# Patient Record
Sex: Male | Born: 1950 | Race: White | Hispanic: No | Marital: Married | Smoking: Never smoker
Health system: Southern US, Community
[De-identification: ages and names within clinical notes are randomized; demographics above are authoritative.]

## PROBLEM LIST (undated history)

## (undated) DIAGNOSIS — Z789 Other specified health status: Secondary | ICD-10-CM

---

## 2021-06-17 ENCOUNTER — Emergency Department (HOSPITAL_COMMUNITY): Payer: Medicare Other

## 2021-06-17 ENCOUNTER — Other Ambulatory Visit: Payer: Self-pay

## 2021-06-17 ENCOUNTER — Inpatient Hospital Stay (HOSPITAL_COMMUNITY)
Admission: EM | Admit: 2021-06-17 | Discharge: 2021-06-29 | DRG: 477 | Disposition: A | Discharge status: Skilled Nursing Facility | Payer: Medicare Other | Attending: Internal Medicine | Admitting: Internal Medicine

## 2021-06-17 DIAGNOSIS — E8809 Other disorders of plasma-protein metabolism, not elsewhere classified: Secondary | ICD-10-CM | POA: Diagnosis present

## 2021-06-17 DIAGNOSIS — M8448XA Pathological fracture, other site, initial encounter for fracture: Secondary | ICD-10-CM

## 2021-06-17 DIAGNOSIS — E871 Hypo-osmolality and hyponatremia: Secondary | ICD-10-CM | POA: Diagnosis present

## 2021-06-17 DIAGNOSIS — R748 Abnormal levels of other serum enzymes: Secondary | ICD-10-CM

## 2021-06-17 DIAGNOSIS — K769 Liver disease, unspecified: Secondary | ICD-10-CM

## 2021-06-17 DIAGNOSIS — C799 Secondary malignant neoplasm of unspecified site: Secondary | ICD-10-CM | POA: Diagnosis present

## 2021-06-17 DIAGNOSIS — T3995XA Adverse effect of unspecified nonopioid analgesic, antipyretic and antirheumatic, initial encounter: Secondary | ICD-10-CM | POA: Diagnosis not present

## 2021-06-17 DIAGNOSIS — G929 Unspecified toxic encephalopathy: Secondary | ICD-10-CM

## 2021-06-17 DIAGNOSIS — J439 Emphysema, unspecified: Secondary | ICD-10-CM | POA: Diagnosis present

## 2021-06-17 DIAGNOSIS — R5381 Other malaise: Secondary | ICD-10-CM

## 2021-06-17 DIAGNOSIS — Z87891 Personal history of nicotine dependence: Secondary | ICD-10-CM

## 2021-06-17 DIAGNOSIS — D72829 Elevated white blood cell count, unspecified: Secondary | ICD-10-CM | POA: Diagnosis present

## 2021-06-17 DIAGNOSIS — G928 Other toxic encephalopathy: Secondary | ICD-10-CM | POA: Diagnosis not present

## 2021-06-17 DIAGNOSIS — M546 Pain in thoracic spine: Secondary | ICD-10-CM

## 2021-06-17 DIAGNOSIS — Z66 Do not resuscitate: Secondary | ICD-10-CM | POA: Diagnosis not present

## 2021-06-17 DIAGNOSIS — C182 Malignant neoplasm of ascending colon: Secondary | ICD-10-CM | POA: Diagnosis present

## 2021-06-17 DIAGNOSIS — D509 Iron deficiency anemia, unspecified: Secondary | ICD-10-CM | POA: Diagnosis present

## 2021-06-17 DIAGNOSIS — Z515 Encounter for palliative care: Secondary | ICD-10-CM

## 2021-06-17 DIAGNOSIS — C7951 Secondary malignant neoplasm of bone: Principal | ICD-10-CM | POA: Diagnosis present

## 2021-06-17 DIAGNOSIS — R52 Pain, unspecified: Secondary | ICD-10-CM

## 2021-06-17 DIAGNOSIS — K6389 Other specified diseases of intestine: Secondary | ICD-10-CM

## 2021-06-17 DIAGNOSIS — D649 Anemia, unspecified: Secondary | ICD-10-CM | POA: Diagnosis present

## 2021-06-17 DIAGNOSIS — K5900 Constipation, unspecified: Secondary | ICD-10-CM | POA: Diagnosis present

## 2021-06-17 DIAGNOSIS — C787 Secondary malignant neoplasm of liver and intrahepatic bile duct: Secondary | ICD-10-CM | POA: Diagnosis present

## 2021-06-17 DIAGNOSIS — Z880 Allergy status to penicillin: Secondary | ICD-10-CM

## 2021-06-17 DIAGNOSIS — M8450XA Pathological fracture in neoplastic disease, unspecified site, initial encounter for fracture: Secondary | ICD-10-CM

## 2021-06-17 DIAGNOSIS — C779 Secondary and unspecified malignant neoplasm of lymph node, unspecified: Secondary | ICD-10-CM | POA: Diagnosis present

## 2021-06-17 DIAGNOSIS — I959 Hypotension, unspecified: Secondary | ICD-10-CM | POA: Diagnosis not present

## 2021-06-17 DIAGNOSIS — I700 Atherosclerosis of aorta: Secondary | ICD-10-CM | POA: Diagnosis present

## 2021-06-17 DIAGNOSIS — M8458XA Pathological fracture in neoplastic disease, other specified site, initial encounter for fracture: Secondary | ICD-10-CM | POA: Diagnosis present

## 2021-06-17 DIAGNOSIS — G893 Neoplasm related pain (acute) (chronic): Secondary | ICD-10-CM | POA: Diagnosis present

## 2021-06-17 DIAGNOSIS — Z7189 Other specified counseling: Secondary | ICD-10-CM

## 2021-06-17 DIAGNOSIS — I2510 Atherosclerotic heart disease of native coronary artery without angina pectoris: Secondary | ICD-10-CM | POA: Diagnosis present

## 2021-06-17 HISTORY — DX: Other specified health status: Z78.9

## 2021-06-17 LAB — CBC
HCT: 36.4 % — ABNORMAL LOW (ref 39.0–52.0)
Hemoglobin: 11.4 g/dL — ABNORMAL LOW (ref 13.0–17.0)
MCH: 26.1 pg (ref 26.0–34.0)
MCHC: 31.3 g/dL (ref 30.0–36.0)
MCV: 83.3 fL (ref 80.0–100.0)
Platelets: 346 10*3/uL (ref 150–400)
RBC: 4.37 MIL/uL (ref 4.22–5.81)
RDW: 18.2 % — ABNORMAL HIGH (ref 11.5–15.5)
WBC: 17 10*3/uL — ABNORMAL HIGH (ref 4.0–10.5)
nRBC: 0 % (ref 0.0–0.2)

## 2021-06-17 LAB — BASIC METABOLIC PANEL
Anion gap: 11 (ref 5–15)
BUN: 24 mg/dL — ABNORMAL HIGH (ref 8–23)
CO2: 25 mmol/L (ref 22–32)
Calcium: 10.5 mg/dL — ABNORMAL HIGH (ref 8.9–10.3)
Chloride: 96 mmol/L — ABNORMAL LOW (ref 98–111)
Creatinine, Ser: 1.01 mg/dL (ref 0.61–1.24)
GFR, Estimated: >60 mL/min (ref >60)
Glucose, Bld: 110 mg/dL — ABNORMAL HIGH (ref 70–99)
Potassium: 4 mmol/L (ref 3.5–5.1)
Sodium: 132 mmol/L — ABNORMAL LOW (ref 135–145)

## 2021-06-17 IMAGING — MR MR THORACIC SPINE WO/W CM
6 of 10 series · 24 of 48 positions shown · IV contrast (gadavist)
Comparison: None Available.

CLINICAL DATA: Initial evaluation for acute back pain, trauma.

EXAM:
MRI THORACIC WITHOUT AND WITH CONTRAST
TECHNIQUE: Multiplanar and multiecho pulse sequences of the thoracic spine were
obtained without and with intravenous contrast.
CONTRAST:  8.5mL GADAVIST GADOBUTROL 1 MMOL/ML IV SOLN

[Series 14: T1 · sagittal · 6.0mm · 1.23mm/px · 1 of 8 slices shown (1 of 3)]
[im 1/8]
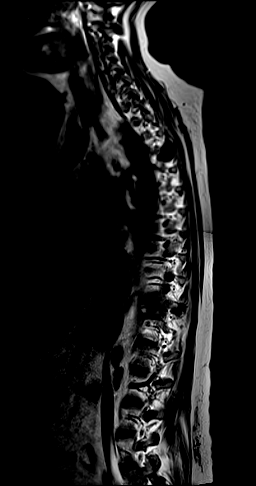

[Series 15: T2 · sagittal · 3.0mm · 0.76mm/px · 3 of 17 slices shown (1 of 3)]
[im 1/17]
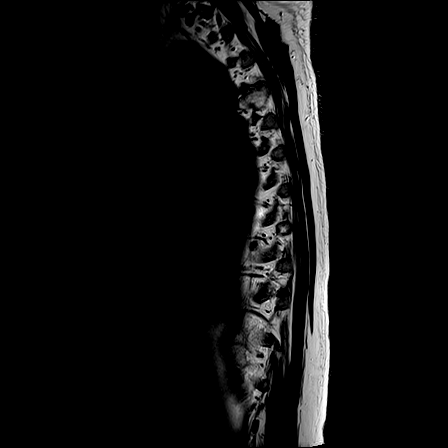
[im 9/17]
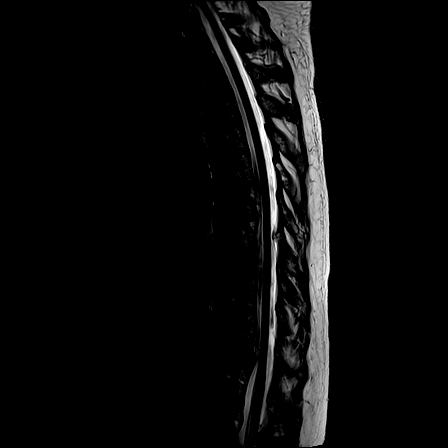
[im 17/17]
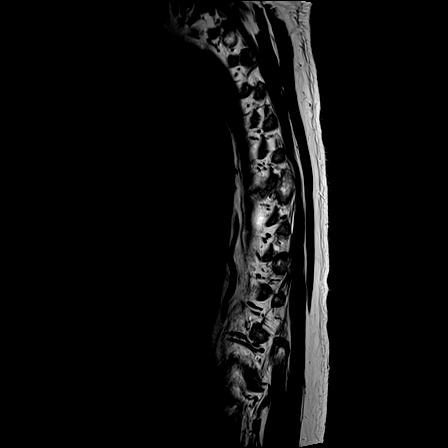

[Series 16: T1 · sagittal · 3.0mm · 0.76mm/px · 3 of 17 slices shown (2 of 3)]
[im 1/17]
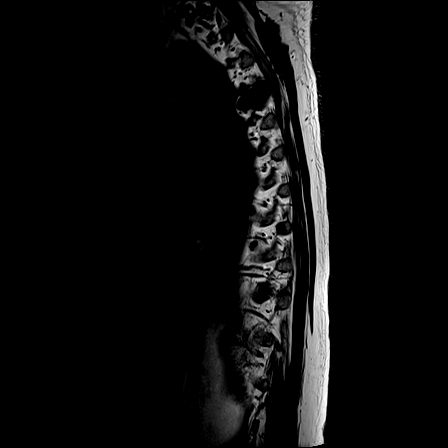
[im 9/17]
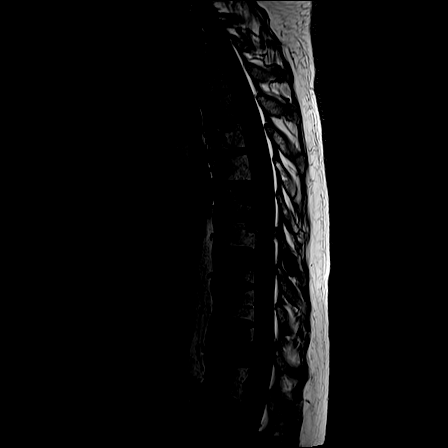
[im 17/17]
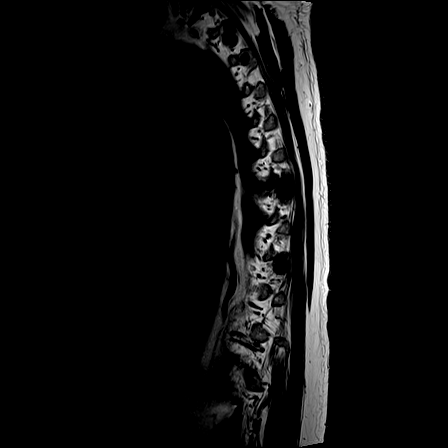

[Series 17: T2 · sagittal · 3.0mm · 0.76mm/px · 3 of 17 slices shown (2 of 3)]
[im 1/17]
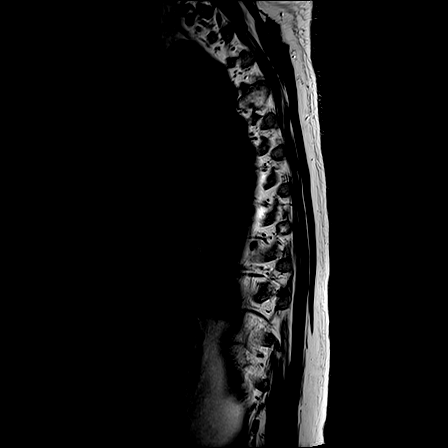
[im 9/17]
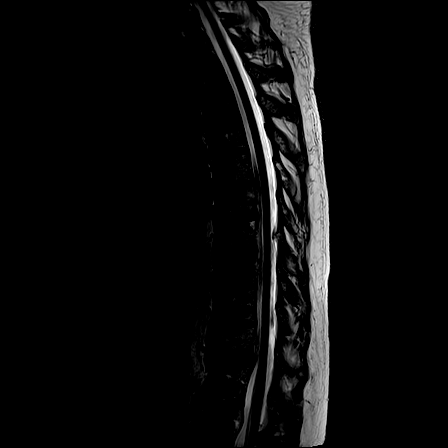
[im 17/17]
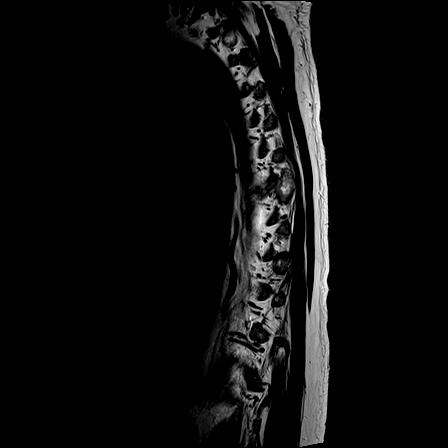

[Series 19: T2 · axial · 4.0mm · 0.59mm/px · z∈[-271,-13]mm · 8 of 40 slices shown (3 of 3)]
[im 1/40]
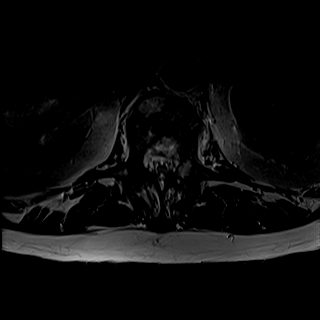
[im 6/40]
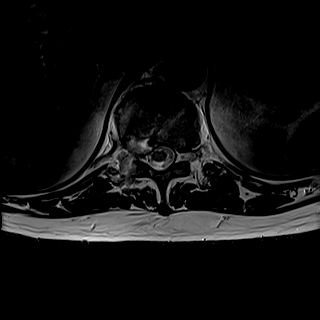
[im 12/40]
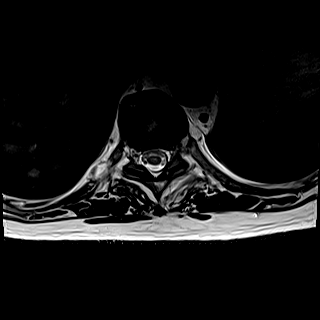
[im 17/40]
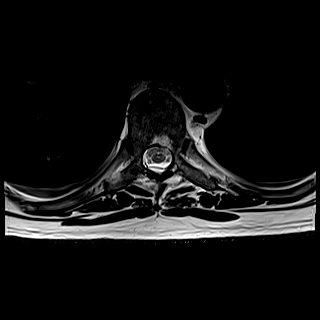
[im 23/40]
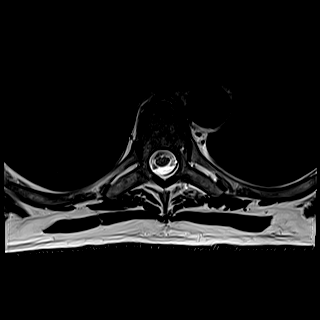
[im 28/40]
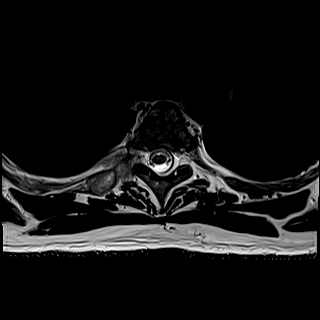
[im 34/40]
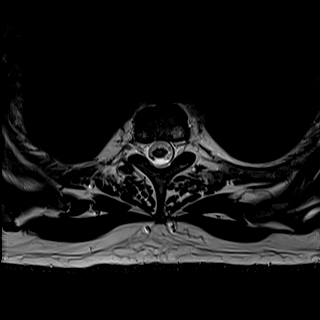
[im 40/40]
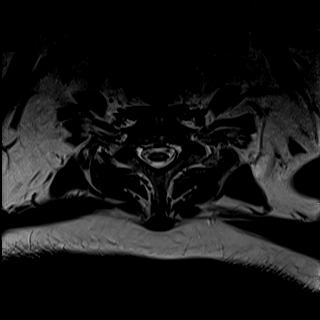

[Series 21: T1 · axial · non-contrast · 4.0mm · 0.31mm/px · z∈[-271,-79]mm · 6 of 40 slices shown (3 of 3)]
[im 1/40]
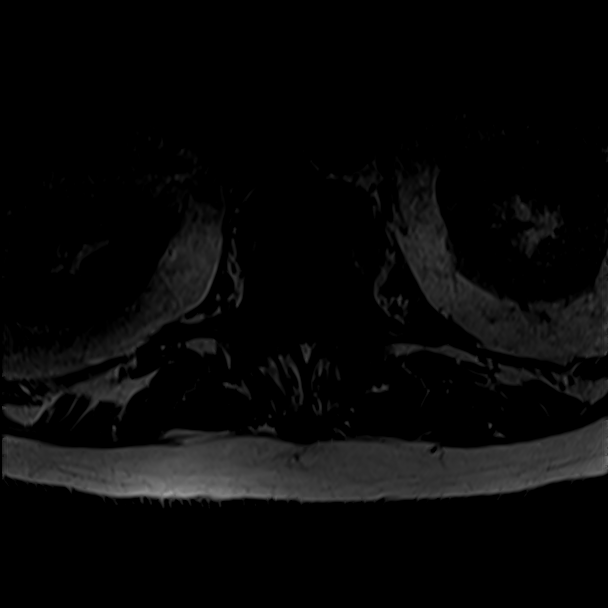
[im 6/40]
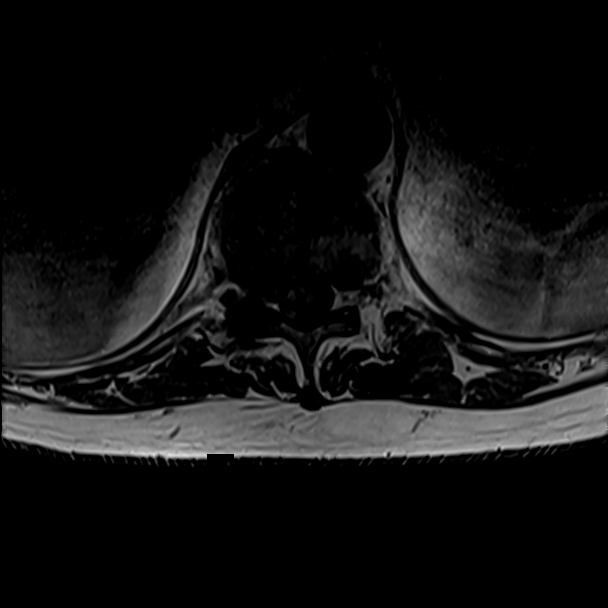
[im 12/40]
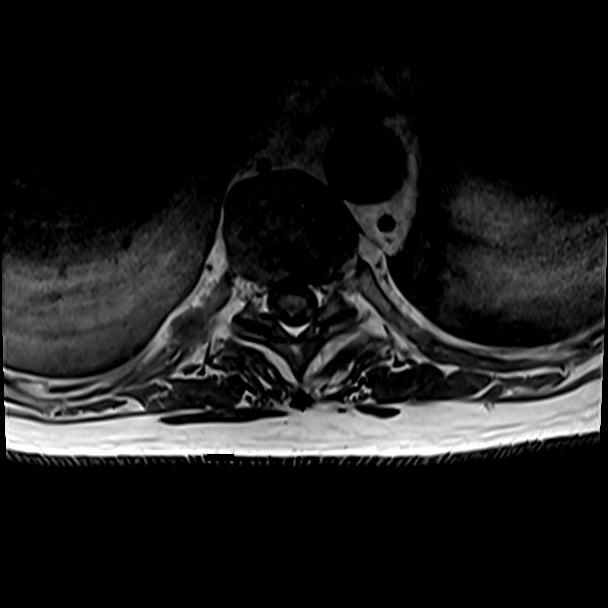
[im 17/40]
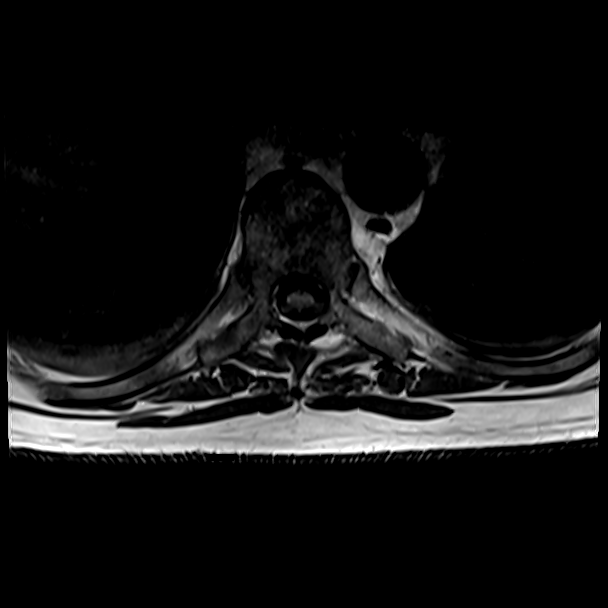
[im 23/40]
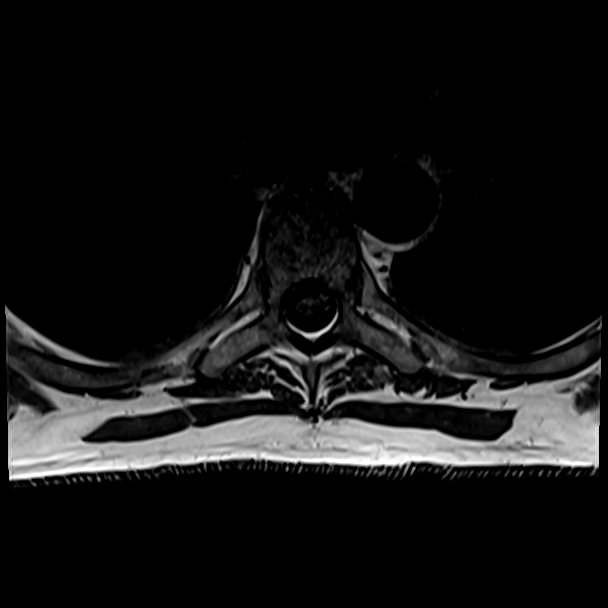
[im 28/40]
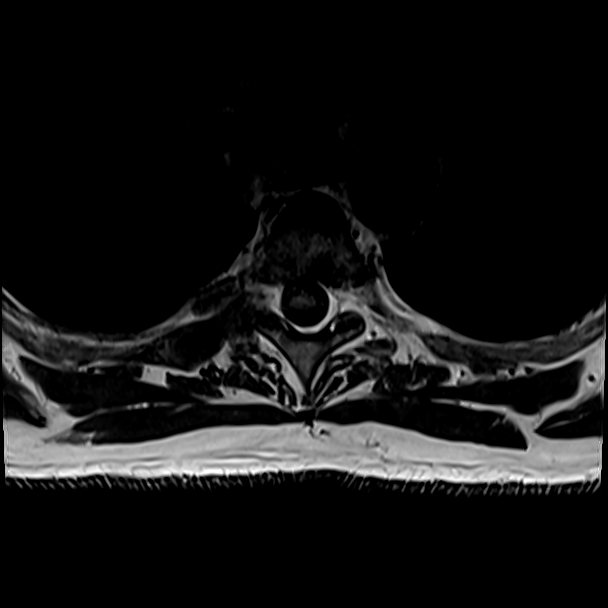

[24 of 48 positions shown; findings below may reference images not displayed]

FINDINGS: Alignment: Physiologic with preservation of the normal thoracic
kyphosis.

Vertebrae: There are innumerable T1 hypointense, stir hyperintense
enhancing lesions seen throughout the thoracic spine, consistent
with widespread osseous metastatic disease. There is involvement of
essentially all levels. Posterior elements are involved at numerous
levels as well. Associated pathologic fracture at T11 with up to 35%
height loss. Evidence for early epidural extension into the ventral
epidural space at T7, T9, and T11. Prominent extra osseous extension
with soft tissue component seen about the left rib and transverse
process of T7, measuring approximately 3.3 x 2.4 cm (series 20,
image 20). Multiple lesion seen involving the partially visualized
posterior ribs as well. No significant or high-grade spinal stenosis
at this time.

Cord: Early epidural extension at the levels of T7, T9, and T11. No
high-grade spinal stenosis or frank cord impingement. Cord itself is
normal in caliber in appearance. No abnormal intramedullary
enhancement.

Paraspinal and other soft tissues: Extraosseous extension with
associated soft tissue component about the left transverse process
of T7 as above. More mild extraosseous extension also noted on the
right at T4 and on the left at T9. Innumerable lesions seen
throughout the partially visualized liver, likely reflecting
metastatic disease.

Disc levels:

No significant underlying disc pathology. No high-grade spinal
stenosis. Mild foraminal narrowing on the right at T6-7 and T11-12
related to extraosseous extension of tumor. Foramina otherwise
remain patent at this time.
IMPRESSION: 1. Widespread osseous metastatic disease throughout the thoracic
spine as above.
2. Associated pathologic fracture at T11 with up to 35% height loss.
3. Evidence for early epidural extension at the levels of T7, T9,
and T11. No high-grade spinal stenosis or frank cord impingement at
this time.
4. Innumerable lesions throughout the partially visualized liver,
likely reflecting metastatic disease.

## 2021-06-17 IMAGING — MR MR LUMBAR SPINE WO/W CM
4 of 7 series · 23 of 48 positions shown · IV contrast (Contrast agent)
Comparison: None Available.

CLINICAL DATA: Initial evaluation for trauma, severe back pain.

EXAM:
MRI LUMBAR SPINE WITHOUT AND WITH CONTRAST
TECHNIQUE: Multiplanar and multiecho pulse sequences of the lumbar spine were
obtained without and with intravenous contrast.
CONTRAST:  8.5mL GADAVIST GADOBUTROL 1 MMOL/ML IV SOLN

[Series 26: T2 · sagittal · 4.0mm · 0.78mm/px · 5 of 17 slices shown (1 of 2)]
[im 1/17]
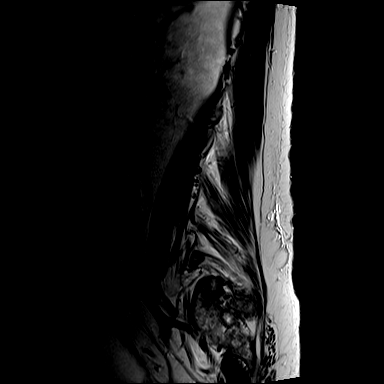
[im 5/17]
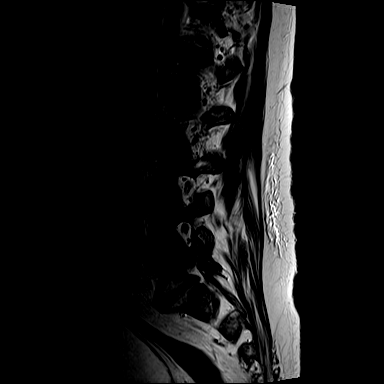
[im 9/17]
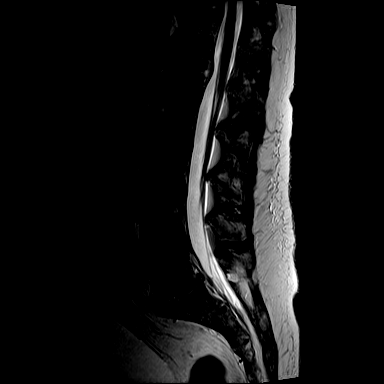
[im 13/17]
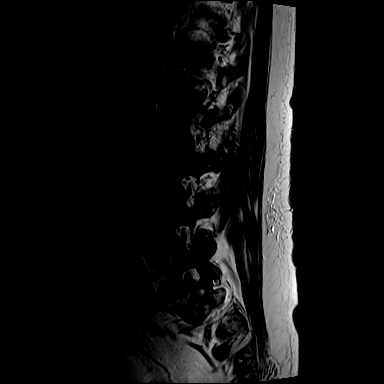
[im 17/17]
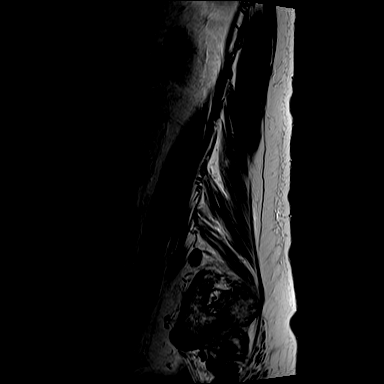

[Series 28: T1 · sagittal · 4.0mm · 0.94mm/px · 4 of 17 slices shown (1 of 2)]
[im 1/17]
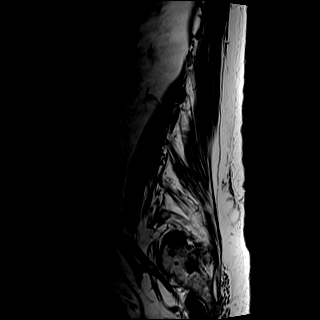
[im 6/17]
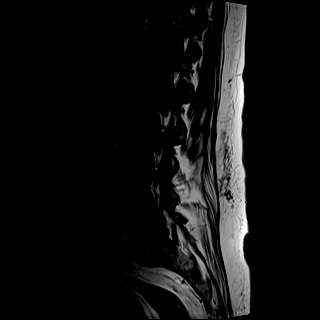
[im 11/17]
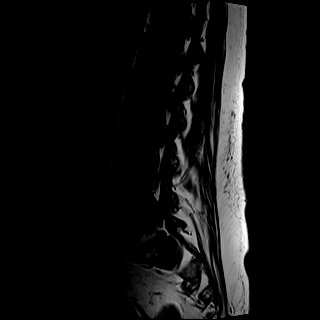
[im 17/17]
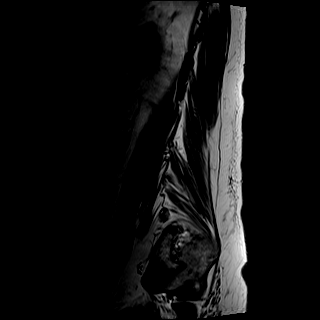

[Series 29: T2 · axial · 4.0mm · 0.57mm/px · z∈[-489,-278]mm · 8 of 39 slices shown (2 of 2)]
[im 1/39]
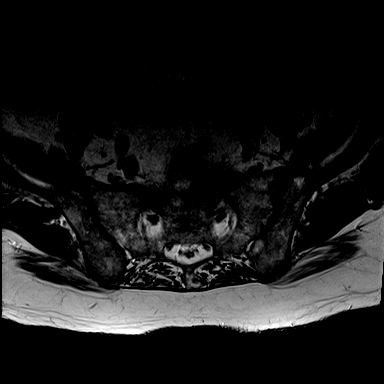
[im 5/39]
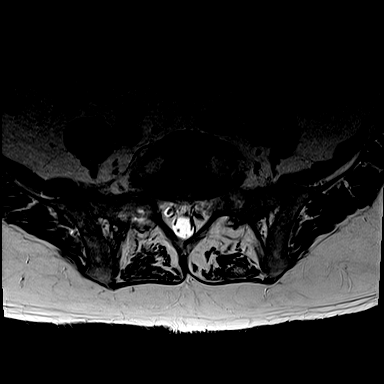
[im 13/39]
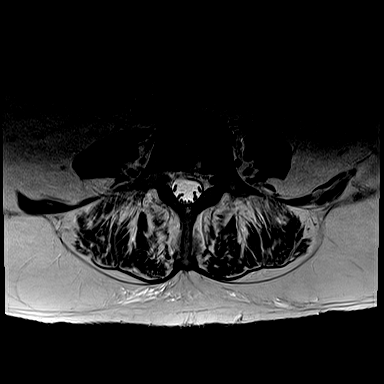
[im 17/39]
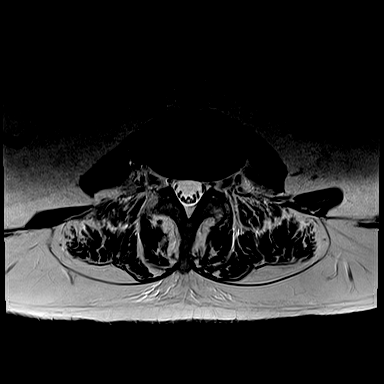
[im 22/39]
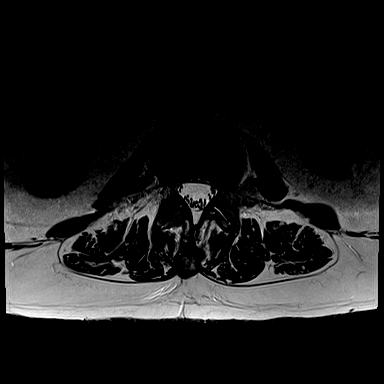
[im 26/39]
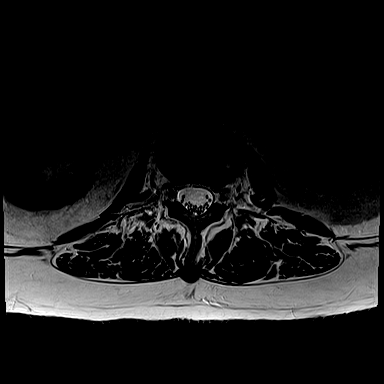
[im 34/39]
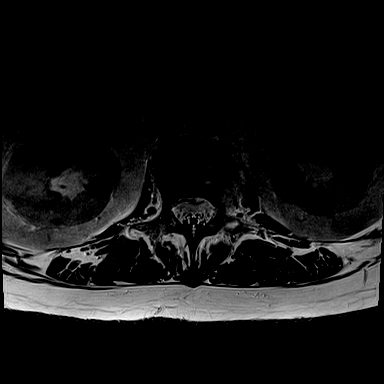
[im 39/39]
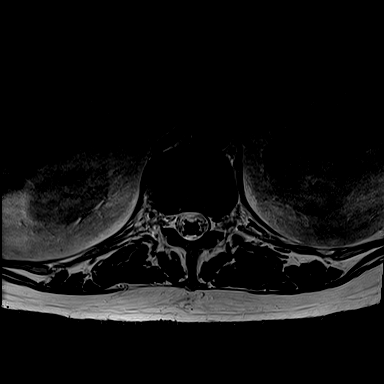

[Series 30: T1 · axial · 4.0mm · 0.34mm/px · z∈[-489,-303]mm · 6 of 39 slices shown (2 of 2)]
[im 1/39]
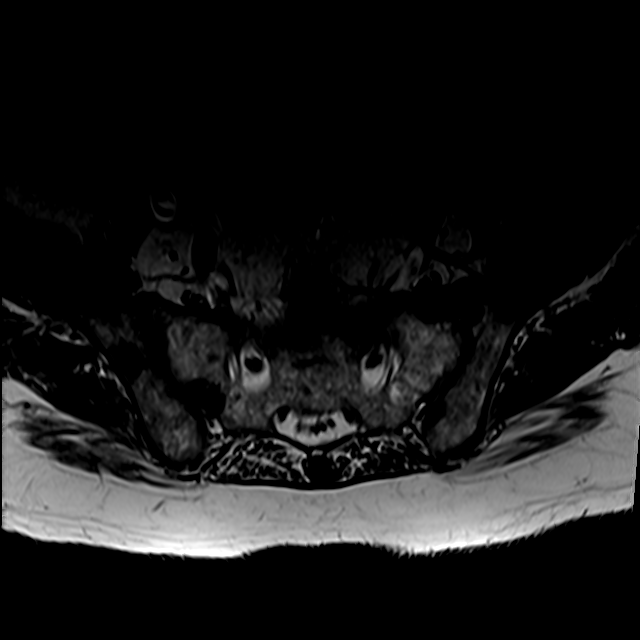
[im 5/39]
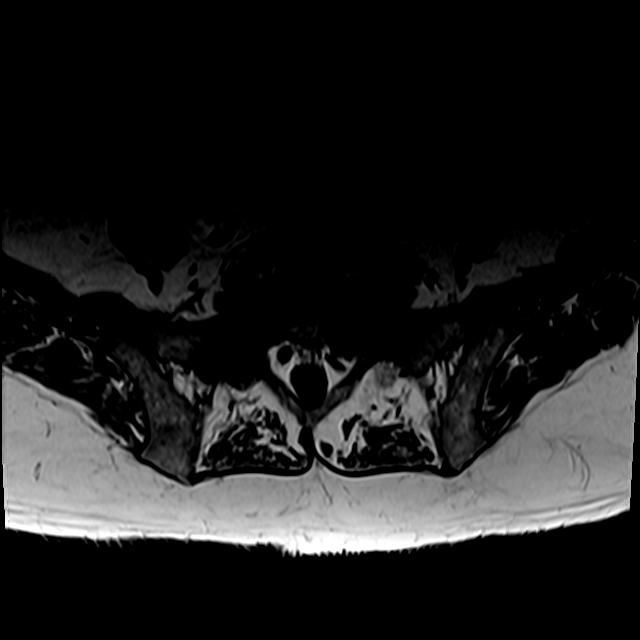
[im 13/39]
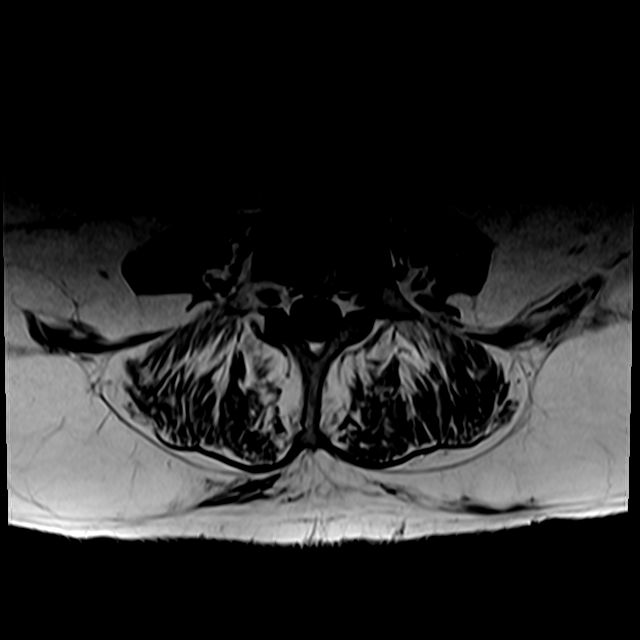
[im 17/39]
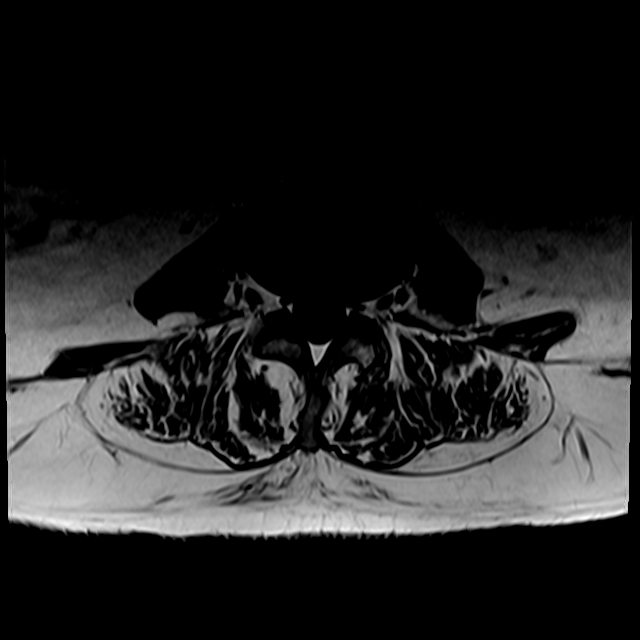
[im 22/39]
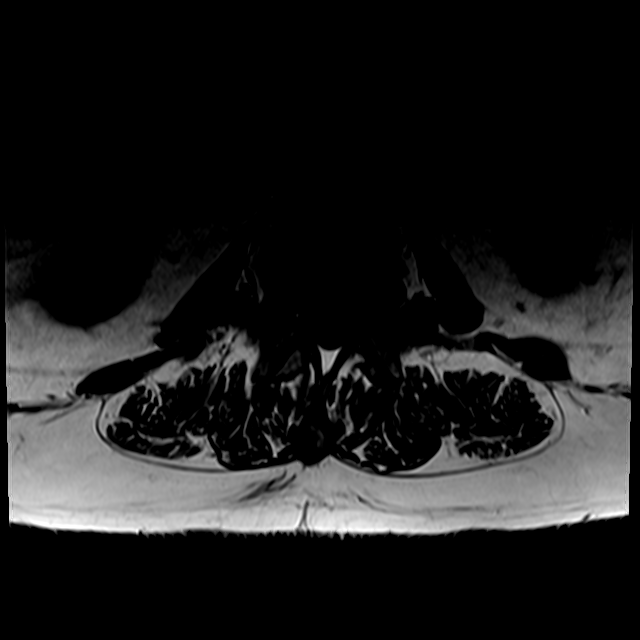
[im 34/39]
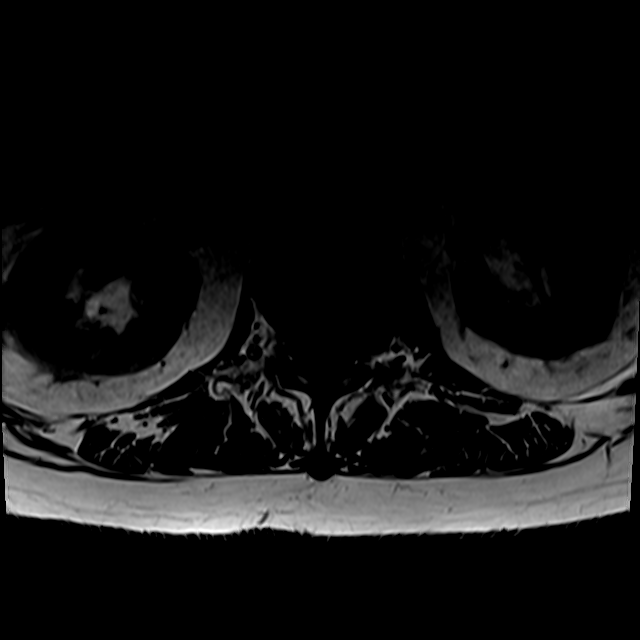

[23 of 48 positions shown; findings below may reference images not displayed]

FINDINGS: Segmentation: Standard. Lowest well-formed disc space labeled the
L5-S1 level.

Alignment: Physiologic with preservation of the normal lumbar
lordosis. No listhesis.

Vertebrae: Widespread osseous metastatic disease seen throughout the
lumbar spine as well as the visualized sacrum and pelvis. There is
involvement of essentially all levels. Scattered posterior element
involvement seen at numerable levels as well. Associated pathologic
fracture at L1 with mild 20% height loss. Evidence for early
epidural extension of tumor into the ventral epidural space at L1
and L5 (series 27, images 9, 7).

Conus medullaris and cauda equina: Conus extends to the L1 level.
Conus and cauda equina appear normal. No abnormal intramedullary
enhancement.

Paraspinal and other soft tissues: Mild extraosseous extension of
tumor with involvement of the adjacent paraspinous soft tissues
noted at multiple levels, most notable at the right transverse
process of L3 (series 32, image 19). Scattered edema within the
lower posterior paraspinous musculature likely reactive. Enlarged
aortocaval lymph nodes measuring up to 1.5 cm in short axis,
concerning for nodal metastatic disease (series 29, images 5, 10).

Disc levels:

T12-L1: Pathologic compression fracture of L1 with mild epidural
extension of tumor. Secondary flattening of the ventral thecal sac,
eccentric to the left. No significant spinal stenosis at this time.
Foramina remain patent.

L1-2:  Unremarkable.

L2-3: Small left extraforaminal disc protrusion with annular
fissure. Mild facet hypertrophy. No stenosis.

L3-4: Disc desiccation with mild disc bulge. Mild facet hypertrophy.
No stenosis.

L4-5: Disc desiccation with mild disc bulge. Mild to moderate facet
hypertrophy. No significant stenosis.

L5-S1: Degenerative intervertebral disc space narrowing with diffuse
disc bulge and reactive endplate change. Epidural extension of tumor
into the right ventral epidural space (series 29, image 33). Mild to
moderate bilateral facet hypertrophy. Mild narrowing of the right
lateral recess without significant spinal stenosis. Mild left L5
foraminal narrowing. Right neural foramen remains patent.
IMPRESSION: 1. Widespread osseous metastatic disease throughout the lumbar spine
as well as the visualized sacrum and pelvis.
2. Associated pathologic fracture of L1 with mild 20% height loss.
Evidence for early epidural extension of tumor into the ventral
epidural space at L1 and L5. No significant spinal stenosis at this
time.
3. Enlarged aortocaval lymph nodes, concerning for nodal metastatic
disease.
4. Underlying mild multilevel degenerative spondylosis as above,
most pronounced at L5-S1.

## 2021-06-17 IMAGING — DX DG CHEST 1V PORT
1 series · 1 of 1 positions shown · non-contrast
Comparison: None Available.

CLINICAL DATA: Cough with leukocytosis.

EXAM:
PORTABLE CHEST 1 VIEW

[chest]
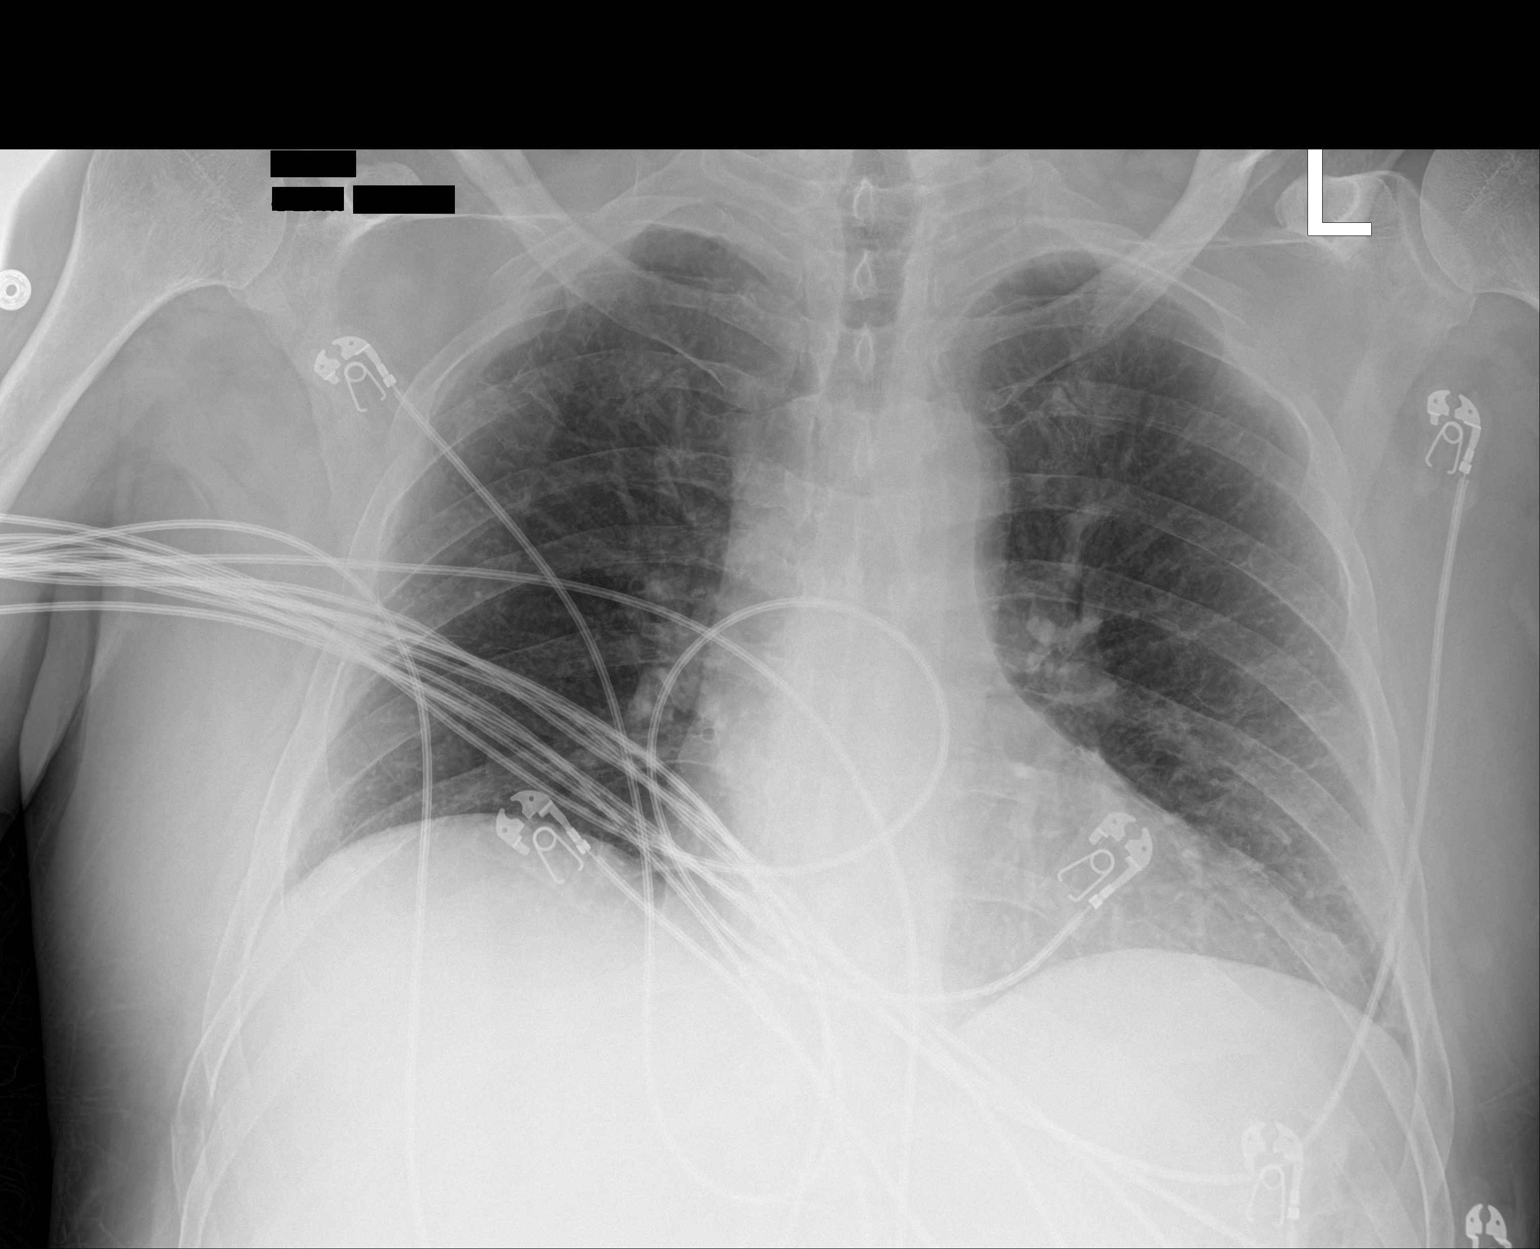

[1 of 1 positions shown; findings below may reference images not displayed]

FINDINGS: Multiple overlying radiopaque cardiac lead wires are noted. The
heart size and mediastinal contours are within normal limits. Both
lungs are clear. An ill-defined lytic area is seen involving the
lateral aspect of the second left rib.
IMPRESSION: 1. No acute infiltrate or pleural effusion.
2. Ill-defined lytic area involving the lateral aspect of the second
left rib. Further evaluation with nonemergent chest CT is
recommended.

## 2021-06-17 MED ORDER — CYCLOBENZAPRINE HCL 10 MG PO TABS
5.0000 mg | ORAL_TABLET | Freq: Once | ORAL | Status: AC
  Administered 2021-06-18: 5 mg via ORAL
  Filled 2021-06-17: qty 1

## 2021-06-17 MED ORDER — FENTANYL CITRATE PF 50 MCG/ML IJ SOSY
50.0000 ug | PREFILLED_SYRINGE | Freq: Once | INTRAMUSCULAR | Status: AC
  Administered 2021-06-18: 50 ug via INTRAVENOUS
  Filled 2021-06-17: qty 1

## 2021-06-17 NOTE — ED Provider Notes (Incomplete)
70 y/o here for nontraumatic back pain.  Neuro intact. MRI pending.   Physical Exam  BP (!) 147/81   Pulse (!) 102   Temp 98.3 F (36.8 C) (Oral)   Resp (!) 30   SpO2 94%   Physical Exam  Procedures  Procedures  ED Course / MDM    Medical Decision Making Amount and/or Complexity of Data Reviewed Labs: ordered. Radiology: ordered.  Risk Prescription drug management.   ***

## 2021-06-17 NOTE — Discharge Instructions (Addendum)
Chest x-ray did show a small area on your left second rib.  The radiology team has recommended you obtain a chest CT as an outpatient which you can discuss with your primary care doctor.

## 2021-06-17 NOTE — ED Triage Notes (Signed)
Pt BIB [facility] from home, reports trying to get in his chair when his legs gave out, denies LOC, did not hit his head, no blood thinners. Pt c/o mid-back pain. C/o leg weakness x weeks.

## 2021-06-17 NOTE — ED Provider Notes (Signed)
71 y/o here for nontraumatic back pain.  Neuro intact. MRI pending.   I visualized and interpreted the MRI results which shows diffuse metastatic lesions throughout the entire spine. I discussed findings with the patient and his wife at bedside.  The patient does not have a primary care doctor and has not seen a doctor in years.  He states that recently he hurt his back lifting a box and has had progressive worsening.  Patient admits that he went to an outpatient walk-in clinic and noted to have a an atraumatic right rib fracture and hypercalcemia at his visit.  His wife states that his legs have been getting weak and when he stands to try to brush his teeth he begins to get very tremorous and has to sit down.  She states that he has been incredible pain.  Patient acknowledges 2-3 nights of soaking night sweats recently.  Physical Exam  BP (!) 147/81   Pulse (!) 102   Temp 98.3 F (36.8 C) (Oral)   Resp (!) 30   SpO2 94%   Physical Exam Vitals and nursing note reviewed.  Constitutional:      General: He is not in acute distress.    Appearance: He is well-developed. He is not diaphoretic.  HENT:     Head: Normocephalic and atraumatic.  Eyes:     General: No scleral icterus.    Conjunctiva/sclera: Conjunctivae normal.  Cardiovascular:     Rate and Rhythm: Normal rate and regular rhythm.     Heart sounds: Normal heart sounds.  Pulmonary:     Effort: Pulmonary effort is normal. No respiratory distress.     Breath sounds: Normal breath sounds.  Abdominal:     Palpations: Abdomen is soft.     Tenderness: There is no abdominal tenderness.  Musculoskeletal:     Cervical back: Normal range of motion and neck supple.     Comments: Mild unsustained clonus at bilateral ankles  Skin:    General: Skin is warm and dry.  Neurological:     Mental Status: He is alert.  Psychiatric:        Behavior: Behavior normal.    Procedures  Procedures  ED Course / MDM   Clinical Course as of  06/18/21 0605  Mon Jun 18, 2021  0318 WBC(!): 17.0 [AH]  0352 MR Lumbar Spine W Wo Contrast [AH]    Clinical Course User Index [AH] Margarita Mail, PA-C   Medical Decision Making Amount and/or Complexity of Data Reviewed Labs: ordered. Decision-making details documented in ED Course. Radiology: ordered. Decision-making details documented in ED Course.  Risk Prescription drug management. Decision regarding hospitalization.   Patient here with severe back pain.  Patient notably has diffuse and extensive metastatic spinal disease, compression fractures and multiple pathologic fractures.  Case discussed with Dr. Lyda Jester who will admit the patient.  Will order CT chest abdomen and pelvis.  Patient declined pain medication at this time.       Margarita Mail, PA-C 06/18/21 2951    Ripley Fraise, MD 06/19/21 [phone]

## 2021-06-17 NOTE — ED Provider Notes (Signed)
[facility] EMERGENCY DEPARTMENT Provider Note   CSN: [id number] Arrival date & time: 06/17/21  2021     History  Chief Complaint  Patient presents with   Fall   Back Pain    Raymond Henderson is a 71 y.o. male with no pertinent past medical history presenting to the ED with back pain.  Patient states that he has been suffering severe lower back pain for the past 1.5 weeks.  Lifting a heavy box about 1 month ago, and had some pain then, but it completely resolved.  The back pain then returned about 1.5 weeks ago, and he has had trouble standing and ambulating due to severe pain.  Today, he was attempting to get up from a chair by placing his hands on the floor and pushing upwards, but fell forward causing him to somersault.  He states that since then, the pain has been much more severe over his lower back and he has new pain over his mid back.  Denies any numbness or tingling.  He has not had any fecal or urinary incontinence or difficulty urinating.  No saddle anesthesia.  He states that his legs feel weak, but mostly because of pain not true weakness.  He did not hit his head during the fall or lose consciousness.  Denies any neck pain.  No fevers or history of IV drug use.   Fall  Back Pain Associated symptoms: no fever, no numbness and no weakness       Home Medications Prior to Admission medications   Not on File      Allergies    Penicillins    Review of Systems   Review of Systems  Constitutional:  Negative for fever.  Genitourinary:  Negative for difficulty urinating.  Musculoskeletal:  Positive for back pain.  Neurological:  Negative for syncope, weakness and numbness.   Physical Exam Updated Vital Signs BP (!) 147/81   Pulse (!) 102   Temp 98.3 F (36.8 C) (Oral)   Resp (!) 30   SpO2 94%  Physical Exam Constitutional:      Appearance: He is not toxic-appearing or diaphoretic.  HENT:     Head: Normocephalic and atraumatic.     Right Ear:  External ear normal.     Left Ear: External ear normal.     Nose: Nose normal.  Eyes:     General: No scleral icterus. Neck:     Comments: No midline C-spine tenderness, step-offs, or deformities. Cardiovascular:     Rate and Rhythm: Normal rate and regular rhythm.     Heart sounds: Normal heart sounds. No murmur heard.   No friction rub. No gallop.  Pulmonary:     Effort: Pulmonary effort is normal. No respiratory distress.     Breath sounds: Normal breath sounds. No stridor. No wheezing, rhonchi or rales.  Abdominal:     Palpations: Abdomen is soft.     Tenderness: There is no abdominal tenderness. There is no guarding or rebound.  Musculoskeletal:        General: No deformity.     Cervical back: Neck supple.     Right lower leg: No edema.     Left lower leg: No edema.     Comments: He does have focal tenderness over the thoracic spine at about T5/T6 and at around T12/L1.  No step-offs or deformities palpated.  Neurological:     Mental Status: He is alert and oriented to person, place, and time.  Sensory: No sensory deficit.     Comments: He has 4+/5 strength upon hip flexion in the bilateral lower extremities, secondary to pain (he is initially 5/5 on testing, but quickly releases his leg back down to the bed due to pain in his back). 5/5 strength on plantar and dorsiflexion bilaterally.    ED Results / Procedures / Treatments   Labs (all labs ordered are listed, but only abnormal results are displayed) Labs Reviewed  CBC - Abnormal; Notable for the following components:      Result Value   WBC 17.0 (*)    Hemoglobin 11.4 (*)    HCT 36.4 (*)    RDW 18.2 (*)    All other components within normal limits  BASIC METABOLIC PANEL - Abnormal; Notable for the following components:   Sodium 132 (*)    Chloride 96 (*)    Glucose, Bld 110 (*)    BUN 24 (*)    Calcium 10.5 (*)    All other components within normal limits    EKG None  Radiology DG Chest Portable 1  View  Result Date: 06/17/2021 CLINICAL DATA:  Cough with leukocytosis. EXAM: PORTABLE CHEST 1 VIEW COMPARISON:  None Available. FINDINGS: Multiple overlying radiopaque cardiac lead wires are noted. The heart size and mediastinal contours are within normal limits. Both lungs are clear. An ill-defined lytic area is seen involving the lateral aspect of the second left rib. IMPRESSION: 1. No acute infiltrate or pleural effusion. 2. Ill-defined lytic area involving the lateral aspect of the second left rib. Further evaluation with nonemergent chest CT is recommended. Electronically Signed   By: Virgina Norfolk M.D.   On: 06/17/2021 22:41    Procedures Procedures    Medications Ordered in ED Medications  cyclobenzaprine (FLEXERIL) tablet 5 mg (has no administration in time range)  fentaNYL (SUBLIMAZE) injection 50 mcg (has no administration in time range)    ED Course/ Medical Decision Making/ A&P                           Medical Decision Making Amount and/or Complexity of Data Reviewed Labs: ordered. Radiology: ordered.  Risk Prescription drug management.   71 year old male with no pertinent past medical history presenting to the ED with back pain.  On exam, the patient is afebrile and hemodynamically stable.  He does have midline tenderness over the thoracic and lumbar spine (at around T5/T6 and T12/L1).  He is neurologically intact in the lower extremities, though he does have some very mild weakness on hip flexion in the bilateral lower extremities due to pain.  Differential diagnosis includes spinal fracture, traumatic disc herniation, cauda equina, musculoskeletal strain.  Given that the patient has had persistent back pain for 1.5 weeks, now with worsening pain, and some difficulty with ambulation due to pain/possible weakness in the lower extremities, will obtain MRI of the thoracic and lumbar spine for further evaluation.  CBC is notable for a leukocytosis to 17.  His  hemoglobin is 11.4 and I do not have priors to compare to.  Regarding leukocytosis, patient does state that about a month ago he was diagnosed with a pneumonia and was given antibiotics.  He has had a persistent cough that is nonproductive.  No known fevers.  A chest x-ray was obtained to evaluate for possible recurrent pneumonia, but does not show any focal consolidations or effusions.  It does show a an ill-defined lytic area over the second left rib;  radiology recommending a nonemergent CT chest (this was included in the patient's paperwork and he was instructed to follow-up with his PCP regarding this finding).  At the time of signout, MRI of the thoracic and lumbar spine is pending.  Pt's care was signed out to Margarita Mail, [place] '@[id number]'$ .        Final Clinical Impression(s) / ED Diagnoses Final diagnoses:  Acute midline thoracic back pain    Rx / DC Orders ED Discharge Orders     None         Sondra Come, MD 06/17/21 2322    Noemi Chapel, MD 06/19/21 1620

## 2021-06-18 ENCOUNTER — Observation Stay (HOSPITAL_COMMUNITY): Payer: Medicare Other

## 2021-06-18 ENCOUNTER — Encounter (HOSPITAL_COMMUNITY): Payer: Self-pay | Admitting: Internal Medicine

## 2021-06-18 DIAGNOSIS — D72829 Elevated white blood cell count, unspecified: Secondary | ICD-10-CM | POA: Diagnosis present

## 2021-06-18 DIAGNOSIS — C189 Malignant neoplasm of colon, unspecified: Secondary | ICD-10-CM | POA: Diagnosis not present

## 2021-06-18 DIAGNOSIS — C7951 Secondary malignant neoplasm of bone: Secondary | ICD-10-CM | POA: Diagnosis present

## 2021-06-18 DIAGNOSIS — C779 Secondary and unspecified malignant neoplasm of lymph node, unspecified: Secondary | ICD-10-CM | POA: Diagnosis present

## 2021-06-18 DIAGNOSIS — I700 Atherosclerosis of aorta: Secondary | ICD-10-CM | POA: Diagnosis present

## 2021-06-18 DIAGNOSIS — R52 Pain, unspecified: Secondary | ICD-10-CM | POA: Diagnosis not present

## 2021-06-18 DIAGNOSIS — C799 Secondary malignant neoplasm of unspecified site: Secondary | ICD-10-CM | POA: Diagnosis present

## 2021-06-18 DIAGNOSIS — C182 Malignant neoplasm of ascending colon: Secondary | ICD-10-CM | POA: Diagnosis present

## 2021-06-18 DIAGNOSIS — T3995XA Adverse effect of unspecified nonopioid analgesic, antipyretic and antirheumatic, initial encounter: Secondary | ICD-10-CM | POA: Diagnosis not present

## 2021-06-18 DIAGNOSIS — R748 Abnormal levels of other serum enzymes: Secondary | ICD-10-CM | POA: Diagnosis not present

## 2021-06-18 DIAGNOSIS — I959 Hypotension, unspecified: Secondary | ICD-10-CM | POA: Diagnosis not present

## 2021-06-18 DIAGNOSIS — M8448XA Pathological fracture, other site, initial encounter for fracture: Secondary | ICD-10-CM | POA: Diagnosis not present

## 2021-06-18 DIAGNOSIS — Z66 Do not resuscitate: Secondary | ICD-10-CM | POA: Diagnosis not present

## 2021-06-18 DIAGNOSIS — K769 Liver disease, unspecified: Secondary | ICD-10-CM | POA: Diagnosis not present

## 2021-06-18 DIAGNOSIS — D509 Iron deficiency anemia, unspecified: Secondary | ICD-10-CM | POA: Diagnosis present

## 2021-06-18 DIAGNOSIS — G928 Other toxic encephalopathy: Secondary | ICD-10-CM | POA: Diagnosis not present

## 2021-06-18 DIAGNOSIS — I2510 Atherosclerotic heart disease of native coronary artery without angina pectoris: Secondary | ICD-10-CM | POA: Diagnosis present

## 2021-06-18 DIAGNOSIS — M8458XA Pathological fracture in neoplastic disease, other specified site, initial encounter for fracture: Secondary | ICD-10-CM | POA: Diagnosis present

## 2021-06-18 DIAGNOSIS — C778 Secondary and unspecified malignant neoplasm of lymph nodes of multiple regions: Secondary | ICD-10-CM | POA: Diagnosis not present

## 2021-06-18 DIAGNOSIS — G893 Neoplasm related pain (acute) (chronic): Secondary | ICD-10-CM | POA: Diagnosis present

## 2021-06-18 DIAGNOSIS — E871 Hypo-osmolality and hyponatremia: Secondary | ICD-10-CM | POA: Diagnosis not present

## 2021-06-18 DIAGNOSIS — G929 Unspecified toxic encephalopathy: Secondary | ICD-10-CM | POA: Diagnosis not present

## 2021-06-18 DIAGNOSIS — D649 Anemia, unspecified: Secondary | ICD-10-CM | POA: Diagnosis present

## 2021-06-18 DIAGNOSIS — C787 Secondary malignant neoplasm of liver and intrahepatic bile duct: Secondary | ICD-10-CM | POA: Diagnosis present

## 2021-06-18 DIAGNOSIS — E8809 Other disorders of plasma-protein metabolism, not elsewhere classified: Secondary | ICD-10-CM | POA: Diagnosis present

## 2021-06-18 DIAGNOSIS — Z7189 Other specified counseling: Secondary | ICD-10-CM

## 2021-06-18 DIAGNOSIS — K5900 Constipation, unspecified: Secondary | ICD-10-CM | POA: Diagnosis present

## 2021-06-18 DIAGNOSIS — M8450XA Pathological fracture in neoplastic disease, unspecified site, initial encounter for fracture: Secondary | ICD-10-CM | POA: Diagnosis not present

## 2021-06-18 DIAGNOSIS — K6389 Other specified diseases of intestine: Secondary | ICD-10-CM | POA: Diagnosis not present

## 2021-06-18 DIAGNOSIS — J439 Emphysema, unspecified: Secondary | ICD-10-CM | POA: Diagnosis present

## 2021-06-18 DIAGNOSIS — Z87891 Personal history of nicotine dependence: Secondary | ICD-10-CM | POA: Diagnosis not present

## 2021-06-18 DIAGNOSIS — Z880 Allergy status to penicillin: Secondary | ICD-10-CM | POA: Diagnosis not present

## 2021-06-18 DIAGNOSIS — Z515 Encounter for palliative care: Secondary | ICD-10-CM | POA: Diagnosis not present

## 2021-06-18 LAB — TYPE AND SCREEN
ABO/RH(D): A POS
Antibody Screen: NEGATIVE

## 2021-06-18 LAB — IRON AND TIBC
Iron: 31 ug/dL — ABNORMAL LOW (ref 45–182)
Saturation Ratios: 15 % — ABNORMAL LOW (ref 17.9–39.5)
TIBC: 202 ug/dL — ABNORMAL LOW (ref 250–450)
UIBC: 171 ug/dL

## 2021-06-18 LAB — CBC WITH DIFFERENTIAL/PLATELET
Abs Immature Granulocytes: 0.23 10*3/uL — ABNORMAL HIGH (ref 0.00–0.07)
Basophils Absolute: 0.1 10*3/uL (ref 0.0–0.1)
Basophils Relative: 1 %
Eosinophils Absolute: 0 10*3/uL (ref 0.0–0.5)
Eosinophils Relative: 0 %
HCT: 36.1 % — ABNORMAL LOW (ref 39.0–52.0)
Hemoglobin: 11.4 g/dL — ABNORMAL LOW (ref 13.0–17.0)
Immature Granulocytes: 2 %
Lymphocytes Relative: 9 %
Lymphs Abs: 1.3 10*3/uL (ref 0.7–4.0)
MCH: 26.4 pg (ref 26.0–34.0)
MCHC: 31.6 g/dL (ref 30.0–36.0)
MCV: 83.6 fL (ref 80.0–100.0)
Monocytes Absolute: 1.6 10*3/uL — ABNORMAL HIGH (ref 0.1–1.0)
Monocytes Relative: 12 %
Neutro Abs: 10.8 10*3/uL — ABNORMAL HIGH (ref 1.7–7.7)
Neutrophils Relative %: 76 %
Platelets: 326 10*3/uL (ref 150–400)
RBC: 4.32 MIL/uL (ref 4.22–5.81)
RDW: 18.4 % — ABNORMAL HIGH (ref 11.5–15.5)
WBC: 14 10*3/uL — ABNORMAL HIGH (ref 4.0–10.5)
nRBC: 0 % (ref 0.0–0.2)

## 2021-06-18 LAB — URINALYSIS, ROUTINE W REFLEX MICROSCOPIC
Bilirubin Urine: NEGATIVE
Glucose, UA: NEGATIVE mg/dL
Hgb urine dipstick: NEGATIVE
Ketones, ur: NEGATIVE mg/dL
Leukocytes,Ua: NEGATIVE
Nitrite: NEGATIVE
Protein, ur: NEGATIVE mg/dL
Specific Gravity, Urine: 1.012 (ref 1.005–1.030)
pH: 7 (ref 5.0–8.0)

## 2021-06-18 LAB — LACTIC ACID, PLASMA: Lactic Acid, Venous: 2 mmol/L (ref 0.5–1.9)

## 2021-06-18 LAB — COMPREHENSIVE METABOLIC PANEL
ALT: 46 U/L — ABNORMAL HIGH (ref 0–44)
AST: 68 U/L — ABNORMAL HIGH (ref 15–41)
Albumin: 2.4 g/dL — ABNORMAL LOW (ref 3.5–5.0)
Alkaline Phosphatase: 759 U/L — ABNORMAL HIGH (ref 38–126)
Anion gap: 10 (ref 5–15)
BUN: 19 mg/dL (ref 8–23)
CO2: 27 mmol/L (ref 22–32)
Calcium: 10.8 mg/dL — ABNORMAL HIGH (ref 8.9–10.3)
Chloride: 98 mmol/L (ref 98–111)
Creatinine, Ser: 0.87 mg/dL (ref 0.61–1.24)
GFR, Estimated: >60 mL/min (ref >60)
Glucose, Bld: 106 mg/dL — ABNORMAL HIGH (ref 70–99)
Potassium: 4.1 mmol/L (ref 3.5–5.1)
Sodium: 135 mmol/L (ref 135–145)
Total Bilirubin: 1.8 mg/dL — ABNORMAL HIGH (ref 0.3–1.2)
Total Protein: 6.5 g/dL (ref 6.5–8.1)

## 2021-06-18 LAB — HEPATIC FUNCTION PANEL
ALT: 46 U/L — ABNORMAL HIGH (ref 0–44)
AST: 73 U/L — ABNORMAL HIGH (ref 15–41)
Albumin: 2.5 g/dL — ABNORMAL LOW (ref 3.5–5.0)
Alkaline Phosphatase: 725 U/L — ABNORMAL HIGH (ref 38–126)
Bilirubin, Direct: 0.9 mg/dL — ABNORMAL HIGH (ref 0.0–0.2)
Indirect Bilirubin: 1.1 mg/dL — ABNORMAL HIGH (ref 0.3–0.9)
Total Bilirubin: 2 mg/dL — ABNORMAL HIGH (ref 0.3–1.2)
Total Protein: 6.7 g/dL (ref 6.5–8.1)

## 2021-06-18 LAB — PROTIME-INR
INR: 1.1 (ref 0.8–1.2)
Prothrombin Time: 14 seconds (ref 11.4–15.2)

## 2021-06-18 LAB — ABO/RH: ABO/RH(D): A POS

## 2021-06-18 LAB — PROCALCITONIN: Procalcitonin: 1.45 ng/mL

## 2021-06-18 LAB — C-REACTIVE PROTEIN: CRP: 15 mg/dL — ABNORMAL HIGH (ref <1.0)

## 2021-06-18 LAB — PSA: Prostatic Specific Antigen: 0.85 ng/mL (ref 0.00–4.00)

## 2021-06-18 LAB — VITAMIN D 25 HYDROXY (VIT D DEFICIENCY, FRACTURES): Vit D, 25-Hydroxy: 18.95 ng/mL — ABNORMAL LOW (ref 30–100)

## 2021-06-18 LAB — APTT: aPTT: 33 seconds (ref 24–36)

## 2021-06-18 IMAGING — CT CT CHEST-ABD-PELV W/ CM
2 of 5 series · 13 of 46 positions shown, 15 images · IV contrast (agent unspecified)
Comparison: MRI thoracic and lumbar spine [DATE]

CLINICAL DATA: Metastatic disease.  Staging.

EXAM:
CT CHEST, ABDOMEN, AND PELVIS WITH CONTRAST
TECHNIQUE: Multidetector CT imaging of the chest, abdomen and pelvis was
performed following the standard protocol during bolus
administration of intravenous contrast.

[Series 3: cap with · axial · 0.80mm/px · z∈[+241,+801]mm · 10 of 134 slices shown, 12 images]
[im 11/134  soft-tissue]
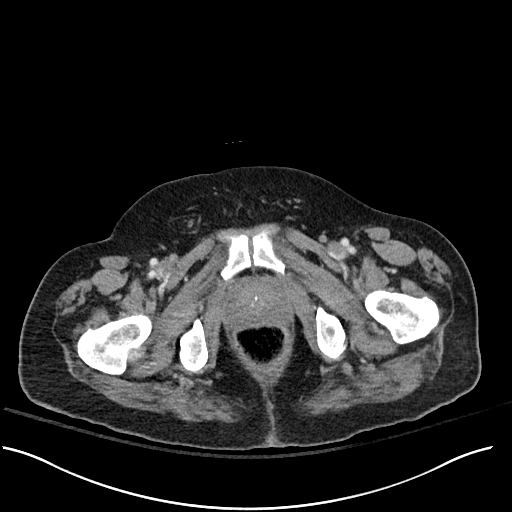
[im 11/134  bone]
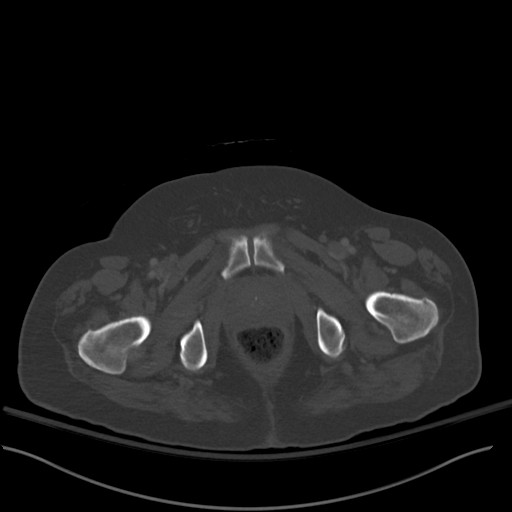
[im 21/134  soft-tissue]
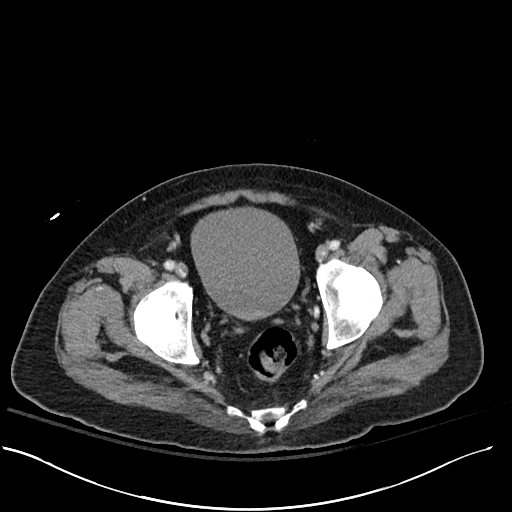
[im 41/134  soft-tissue]
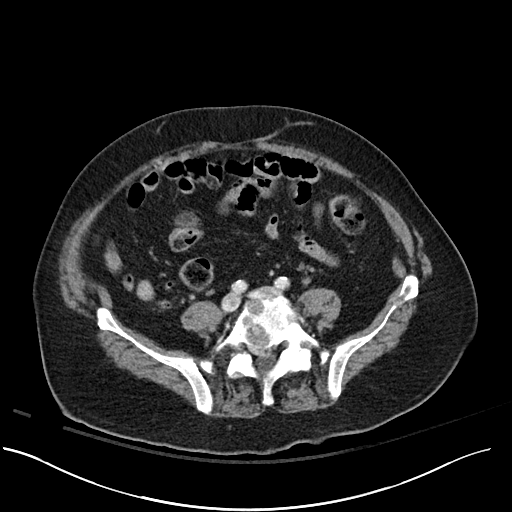
[im 52/134  soft-tissue]
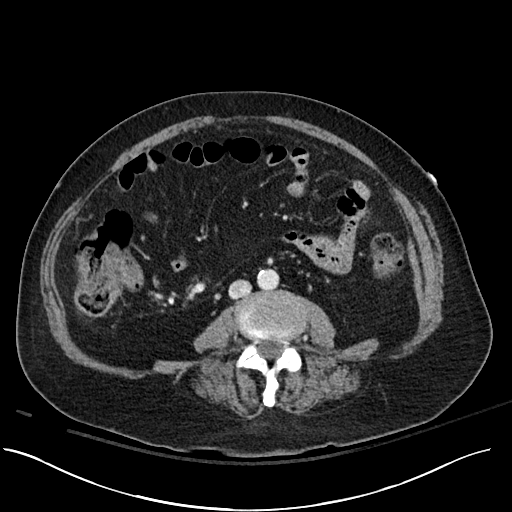
[im 62/134  soft-tissue]
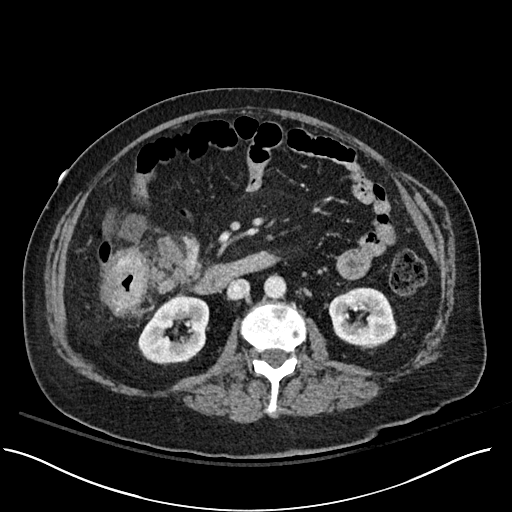
[im 72/134  soft-tissue]
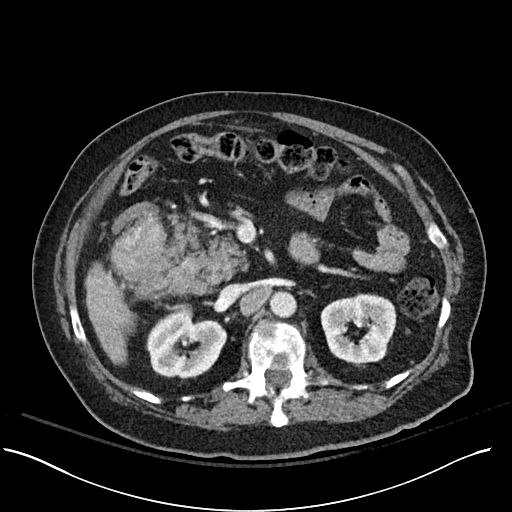
[im 82/134  soft-tissue]
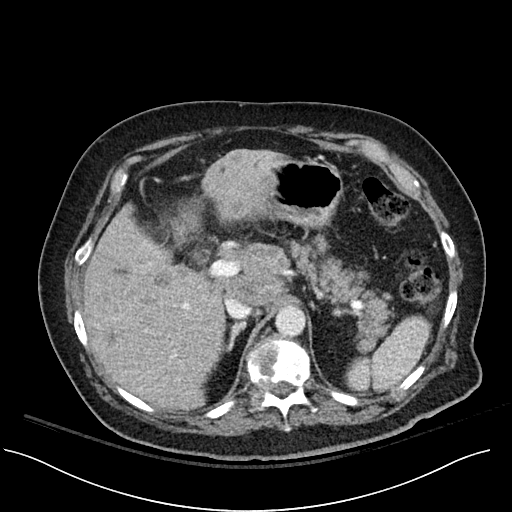
[im 103/134  soft-tissue]
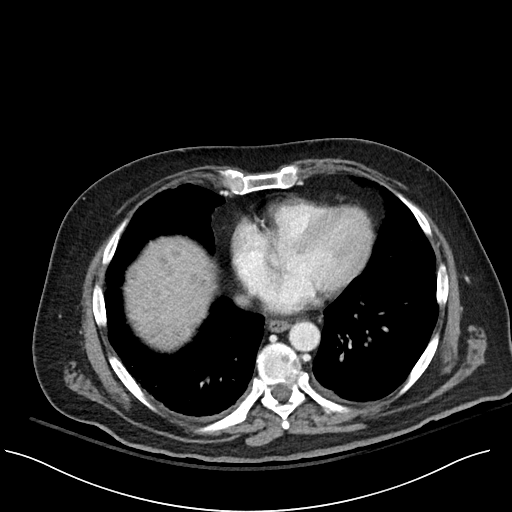
[im 113/134  soft-tissue]
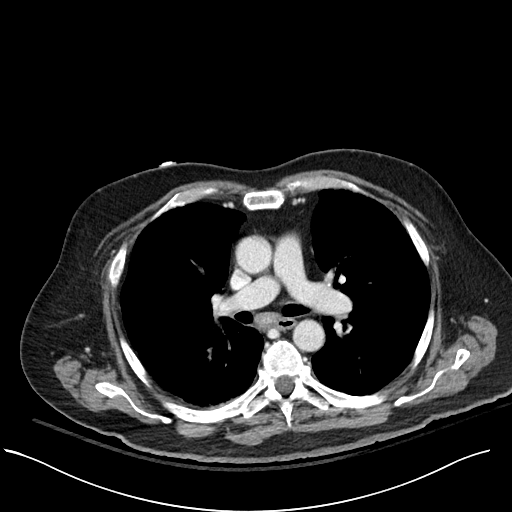
[im 113/134  bone]
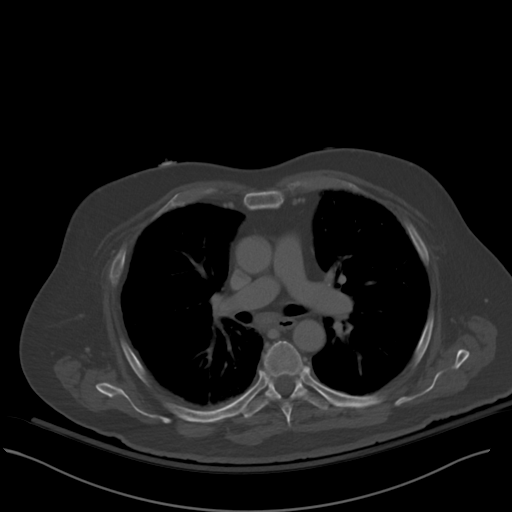
[im 123/134  soft-tissue]
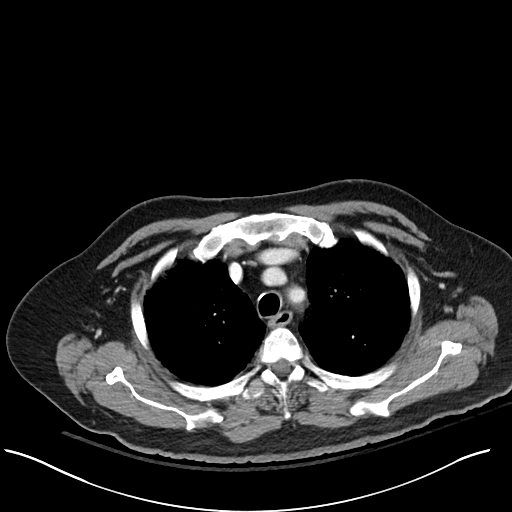

[Series 6: cor · coronal · 0.84mm/px · 3 of 105 slices shown]
[im 35/105  soft-tissue]
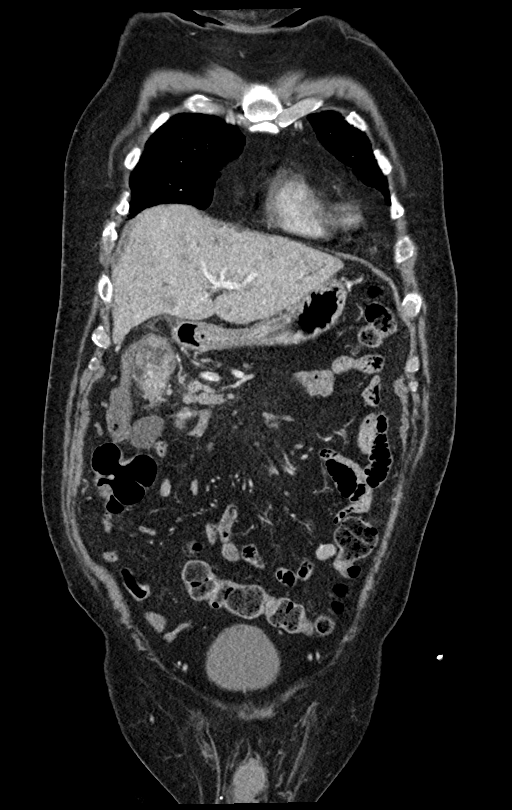
[im 47/105  soft-tissue]
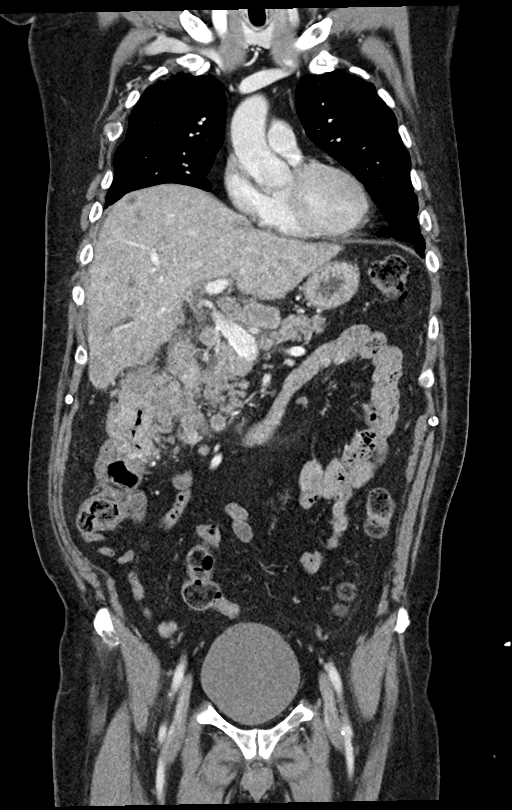
[im 58/105  soft-tissue]
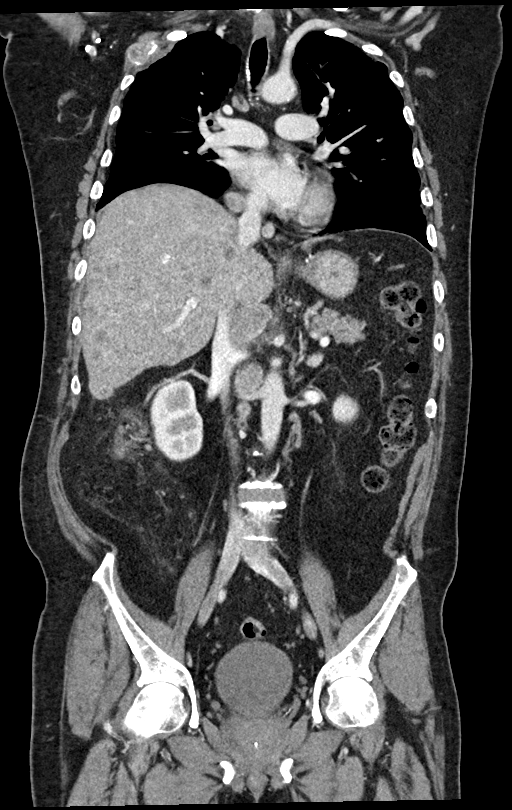

[13 of 46 positions shown; findings below may reference images not displayed]

RADIATION DOSE REDUCTION: This exam was performed according to the
departmental dose-optimization program which includes automated
exposure control, adjustment of the mA and/or kV according to
patient size and/or use of iterative reconstruction technique.

CONTRAST:  100mL OMNIPAQUE IOHEXOL 300 MG/ML  SOLN
FINDINGS: CT CHEST FINDINGS

Cardiovascular: The heart size appears within normal limits. No
pericardial effusion identified. Aortic atherosclerosis and coronary
artery calcifications.

Mediastinum/Nodes: Thyroid gland, trachea and esophagus are
unremarkable. Subcarinal lymph node measures 1.5 cm, image [DATE]. No
enlarged axillary, supraclavicular, or hilar lymph nodes.

Lungs/Pleura: No pleural effusion. Mild changes of emphysema. No
suspicious pulmonary nodule or mass.

Musculoskeletal: Multifocal lytic bone metastases identified
involving the ribs, sternum and spine. Index lesions include:

-expansile lesion with associated pathologic fracture involves the
anterolateral aspect of the right second rib measuring 2.1 cm, image
[DATE].

-lesion involving the sternal manubrium measures 0.9 cm, [DATE].

-lesion involving the T11 vertebra measures 3.6 cm and extends into
the right posterior elements, image 125/5. Associated pathologic
compression fracture is noted with loss of approximately 25% of the
vertebral body height. (See thoracic spine MRI for further details
regarding the thoracic spine metastasis.)

CT ABDOMEN PELVIS FINDINGS

Hepatobiliary: Diffuse liver metastases are identified throughout
both lobes of the liver. Lesions are too numerous to count. Index
lesions include:

-segment 2 lesion measuring 2.4 x 2.0 cm, image 44/3.

-partially exophytic lesion involving segment 6 measures 2.1 x
cm, image 62/3.

-segment 4B lesion measures 2.1 x 1.4 cm, image [DATE].

The gallbladder appears surgically absent.

Pancreas: Unremarkable. No pancreatic ductal dilatation or
surrounding inflammatory changes.

Spleen: Normal in size without focal abnormality.

Adrenals/Urinary Tract: Adrenal glands are unremarkable. Kidneys are
normal, without renal calculi, focal lesion, or hydronephrosis.
Bladder is unremarkable.

Stomach/Bowel: Stomach appears normal. No signs of small bowel wall
thickening, inflammation, or distension. Circumferential mass is
identified involving the mid to distal ascending colon measures
x 5.2 by 8.9 cm, image 67/3. There is significant luminal narrowing
involving the affected segment without signs of bowel obstruction.
Transmural extension of tumor is identified with increased
pericolonic soft tissue infiltration.

Vascular/Lymphatic: Aortic atherosclerosis without aneurysm. Upper
abdominal adenopathy is identified:

-portacaval lymph node measures 2.3 cm short axis, image 53/3.

-porta hepatic lymph node measures 2.3 cm, image 52/3.

-ileocolic mesenteric lymph node measures 2.9 cm, image 70/3.

No pelvic or inguinal adenopathy.

Reproductive: Mild prostate gland enlargement.

Other: No ascites. Peritoneal nodule within the left hemiabdomen is
identified measuring 8 mm, image 70/3.

Musculoskeletal: Multifocal lytic bone metastases identified
involving the lumbar spine and bony pelvis. Index lesions include:

-L2 vertebral body lesion measures 2.7 cm, image 72/7.

-left inferior pubic rami lesion measures 1.5 cm, image 130/3.

-right acetabular lesion measures 0.9 cm, image 111/3.

Bilateral proximal femur lesions are identified:

-Lesion involving the lesser trochanter of the proximal left femur
measures 1.7 cm, image 127/3.

-intertrochanteric lesion involving the proximal right femur
measures 2.3 cm, image 126/3.
IMPRESSION: 1. Imaging findings compatible with primary ascending colon
carcinoma. Transmural extension of tumor is identified with
increased pericolonic soft tissue infiltration. There is significant
luminal narrowing involving the affected segment of colon without
signs of high-grade bowel obstruction.
2. Extensive liver metastases.
3. Upper abdominal and mesenteric adenopathy compatible with
metastatic adenopathy. Enlarged subcarinal lymph node compatible
with metastatic disease. Please see report from MRI of the thoracic
and lumbar spine, also performed today.
4. There are lytic lesions involving bilateral proximal femurs which
may be of orthopedic significance.
5. Coronary artery calcifications noted.
6. Aortic Atherosclerosis ([SD]-[SD]) and Emphysema ([SD]-[SD]).

## 2021-06-18 MED ORDER — HYDROMORPHONE HCL 1 MG/ML IJ SOLN
0.5000 mg | INTRAMUSCULAR | Status: DC | PRN
  Administered 2021-06-18 (×2): 0.5 mg via INTRAVENOUS
  Filled 2021-06-18: qty 0.5
  Filled 2021-06-18: qty 1

## 2021-06-18 MED ORDER — ONDANSETRON HCL 4 MG/2ML IJ SOLN
4.0000 mg | Freq: Four times a day (QID) | INTRAMUSCULAR | Status: DC | PRN
  Administered 2021-06-25: 4 mg via INTRAVENOUS
  Filled 2021-06-18: qty 2

## 2021-06-18 MED ORDER — MORPHINE SULFATE (PF) 2 MG/ML IV SOLN
2.0000 mg | INTRAVENOUS | Status: DC | PRN
  Administered 2021-06-18 (×2): 2 mg via INTRAVENOUS
  Filled 2021-06-18 (×2): qty 1

## 2021-06-18 MED ORDER — SODIUM CHLORIDE 0.9 % IV BOLUS: 1000.0000 mL | Freq: Once | INTRAVENOUS | Status: DC

## 2021-06-18 MED ORDER — IOHEXOL 300 MG/ML  SOLN
100.0000 mL | Freq: Once | INTRAMUSCULAR | Status: AC | PRN
  Administered 2021-06-18: 100 mL via INTRAVENOUS

## 2021-06-18 MED ORDER — ZOLEDRONIC ACID 4 MG/5ML IV CONC
4.0000 mg | Freq: Once | INTRAVENOUS | Status: AC
  Administered 2021-06-18: 4 mg via INTRAVENOUS
  Filled 2021-06-18: qty 5

## 2021-06-18 MED ORDER — POLYETHYLENE GLYCOL 3350 17 G PO PACK
17.0000 g | PACK | Freq: Two times a day (BID) | ORAL | Status: DC
  Administered 2021-06-19: 17 g via ORAL
  Filled 2021-06-18: qty 1

## 2021-06-18 MED ORDER — GADOBUTROL 1 MMOL/ML IV SOLN
8.5000 mL | Freq: Once | INTRAVENOUS | Status: AC | PRN
Start: 2021-06-18 — End: 2021-06-18
  Administered 2021-06-18: 8.5 mL via INTRAVENOUS

## 2021-06-18 MED ORDER — SODIUM CHLORIDE 0.9 % IV SOLN
510.0000 mg | Freq: Once | INTRAVENOUS | Status: AC
  Administered 2021-06-19: 510 mg via INTRAVENOUS
  Filled 2021-06-18: qty 17

## 2021-06-18 MED ORDER — ACETAMINOPHEN 650 MG RE SUPP: 650.0000 mg | Freq: Four times a day (QID) | RECTAL | Status: DC | PRN

## 2021-06-18 MED ORDER — IOHEXOL 300 MG/ML  SOLN
100.0000 mL | Freq: Once | INTRAMUSCULAR | Status: DC | PRN
Start: 2021-06-18 — End: 2021-06-29

## 2021-06-18 MED ORDER — ACETAMINOPHEN 325 MG PO TABS
650.0000 mg | ORAL_TABLET | Freq: Four times a day (QID) | ORAL | Status: DC | PRN
  Administered 2021-06-25: 650 mg via ORAL
  Filled 2021-06-18 (×2): qty 2

## 2021-06-18 MED ORDER — LACTATED RINGERS IV SOLN
INTRAVENOUS | Status: AC
Start: 2021-06-18 — End: 2021-06-18

## 2021-06-18 MED ORDER — SODIUM CHLORIDE 0.9 % IV BOLUS
1000.0000 mL | Freq: Once | INTRAVENOUS | Status: AC
  Administered 2021-06-18: 1000 mL via INTRAVENOUS

## 2021-06-18 MED ORDER — POLYETHYLENE GLYCOL 3350 17 G PO PACK: 17.0000 g | PACK | Freq: Every day | ORAL | Status: DC | PRN

## 2021-06-18 MED ORDER — SENNOSIDES-DOCUSATE SODIUM 8.6-50 MG PO TABS
2.0000 | ORAL_TABLET | Freq: Two times a day (BID) | ORAL | Status: DC
  Administered 2021-06-19 – 2021-06-29 (×18): 2 via ORAL
  Filled 2021-06-18 (×18): qty 2

## 2021-06-18 MED ORDER — ONDANSETRON HCL 4 MG PO TABS: 4.0000 mg | ORAL_TABLET | Freq: Four times a day (QID) | ORAL | Status: DC | PRN

## 2021-06-18 MED ORDER — OXYCODONE-ACETAMINOPHEN 5-325 MG PO TABS
1.0000 | ORAL_TABLET | ORAL | Status: DC | PRN
  Administered 2021-06-19 (×2): 1 via ORAL
  Filled 2021-06-18 (×2): qty 1

## 2021-06-18 NOTE — Progress Notes (Signed)
PROGRESS NOTE    Jolan Upchurch  XBW:[id number] DOB: 1950/04/17 DOA: 06/17/2021 PCP: Kathalene Frames, MD    Chief Complaint  Patient presents with   Fall   Back Pain    Brief Narrative:    This is a no charge note as patient was seen and admitted earlier today by Dr. Cyd Silence, chart, imaging and labs were reviewed, patient was seen and examined  71 year old male with no past medical history who presents to [facility] emergency department with complaints of back pain.   Patient explains that approximately 5 weeks ago he began to experience back pain.  Back pain is mainly located in the midline thoracic region.  Pain was initially mild in intensity but rapidly became more more severe.  Pain is sharp in quality, severe in intensity and radiates distally.  Pain is worse with movement and particularly with weightbearing.  Patient denies any associated weakness of the extremities.  Patient denies any fever, dysuria nausea or vomiting.     Upon further questioning patient reports over the same span of time sudden constipation for approximately the past 5 weeks with the patient only being able to move his bowels once every 10 days.  Patient additionally reports an unintentional 15 pound weight loss over the same span of time as well as intermittent night sweats.  Patient denies blood in the stool.     Patient reports that he last had a colonoscopy approximately 15 years ago in [place] and has not seen a PCP in approximately that many years.   Patient reports going to a local urgent care clinic earlier in the course of his illness.  Patient was initially told that he had muscle strain "and possibly pneumonia" but was never prescribed antibiotic.  Last week the patient presented to establish a new primary care provider at the local clinic.  His new PCP expressed concern about his symptoms and ordered an extensive radiographic work-up (which he hasnt had done yet) and stated that if his  pain continues to worsen to go to the emergency department.   On Sunday patient's pain got to the point where patient could barely get out of bed, resulting in a fall and prompting his presentation to [facility] emergency department for evaluation.   Upon arrival to the emergency department initial work-up included MRI imaging of the thoracolumbar spine which identified widespread osseous metastatic disease seen in the lumbar spine sacrum and pelvis with an associated pathologic L1 fracture without foraminal stenosis.  Due to intractable pain, inability to ambulate and suspected metastatic disease to the hospital scrip was then called to assess the patient for admission to the hospital.    Assessment & Plan:   Principal Problem:   Metastatic cancer [facility]) Active Problems:   Pathologic fracture of lumbar vertebra, initial encounter   Pathologic fracture of thoracic vertebrae, initial encounter   Hypercalcemia   Normocytic anemia   Goals of care, counseling/discussion   Metastatic cancer [facility]) Patient presenting with a 5-week history of increasing back pain, constipation, unintentional weight loss and night sweats CT and MRI imaging identified evidence of diffuse metastatic malignancy with lesions noted throughout the osseous structures and liver with what appears to be a sizable mass in the ascending colon that may very well be the primary Discussed with Dr. Watt Climes from GI, liver biopsy likely would be easier than obtaining colonoscopy for sole reason of biopsy, so IR consulted for CT-guided liver biopsy. Oncology and palliative are following, further decision pending  work-up results. Unfortunately patient with widely metastatic disease, and his pain has been difficult to control on current regimen of morphine and oxycodone, so I will advance him to IV Dilaudid, he will need frequent dosing, and likely he will need one-point to be transitioned to long-acting opiates.   Pathologic  fracture of lumbar vertebra, initial encounter Patient appears to have pathologic fractures of both L1 and T11, likely secondary to metastatic spread For the time being, managing with as needed opiate-based analgesics which will be titrated upwards generously as necessary to achieve pain control Patient also suffering from concurrent hypercalcemia likely secondary to diffuse bony destruction which is being managed with intravenous fluids PT evaluation ordered Once a diagnosis has been established can coordinate with oncology as to whether localized radiation or kyphoplasty have a role in managing these pathologic fractures. Vitamin D level ordered   Pathologic fracture of thoracic vertebrae, initial encounter Please see assessment and plan above   Hypercalcemia Mild hypercalcemia noted on chemistry Likely secondary to diffuse metastatic bony destruction Hydrating patient with intravenous isotonic fluids Monitoring calcium levels with serial chemistries No need for zoledronic acid at this time   Normocytic anemia Patient exhibiting a modest anemia on initial CBC Source is likely patient's a sending colonic mass Patient denies any evidence of gross bleeding Considering how hypovolemic this patient is I suspect that the patient's hemoglobin will drop precipitously with serial CBCs Obtaining iron panel Gastroenterology consultation as noted above Monitoring hemoglobin and hematocrit with serial CBCs.   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 Patient also wishes to learn more about potential hospice care and is requesting a palliative care consultation after it was offered Patient wishes to be DNR and is of sound  mind to make this decision.  Order has been placed.         DVT prophylaxis: SCD Code Status: DNR Family Communication: None at bedside Disposition:   Status is: Observation The patient will require care spanning > 2 midnights and should be moved to inpatient because: Need further work-up for his metastatic colon cancer, as well intractable metastatic pain, will need frequent titration and adjustment of IV pain medications   Consultants:  Oncology Palliative medicine Interventional radiology  Subjective:  Patient complaining of significant lower back pain, as well complaining of abdominal pain, requesting his bed to be flat  Objective: Vitals:   06/18/21 0930 06/18/21 1000 06/18/21 1200 06/18/21 1300  BP: (!) 143/98 (!) 141/88 (!) 141/92 (!) 151/84  Pulse: 100 (!) 104 88 88  Resp: (!) 27 (!) '22 19 17  '$ Temp:      TempSrc:      SpO2: 95% 95% 96% 96%  Weight:      Height:        Intake/Output Summary (Last 24 hours) at 06/18/2021 1516 Last data filed at 06/18/2021 0805 Gross per 24 hour  Intake --  Output 500 ml  Net -500 ml   Filed Weights   06/18/21 0835  Weight: 83.5 kg    Examination:  Awake Alert, Oriented X 3, IN  discomfort due to pain Symmetrical Chest wall movement, Good air movement bilaterally, CTAB RRR,No Gallops,Rubs or new Murmurs, No Parasternal Heave +ve B.Sounds, Abd Soft, diffuse tenderness, No rebound - guarding or rigidity.  No Cyanosis, Clubbing or edema, No new Rash or bruise       Data Reviewed: I have personally reviewed following labs and imaging studies  CBC: Recent Labs  Lab 06/17/21 2138 06/18/21 0810  WBC 17.0* 14.0*  NEUTROABS  --  10.8*  HGB 11.4* 11.4*  HCT 36.4* 36.1*  MCV 83.3 83.6  PLT 346 947    Basic Metabolic Panel: Recent Labs  Lab 06/17/21 2138 06/18/21 0810  NA 132* 135  K 4.0 4.1  CL 96* 98  CO2 25 27  GLUCOSE 110* 106*  BUN 24* 19  CREATININE 1.01 0.87  CALCIUM 10.5* 10.8*    GFR: Estimated  Creatinine Clearance: 76.7 mL/min (by C-G formula based on SCr of 0.87 mg/dL).  Liver Function Tests: Recent Labs  Lab 06/18/21 0509 06/18/21 0810  AST 73* 68*  ALT 46* 46*  ALKPHOS 725* 759*  BILITOT 2.0* 1.8*  PROT 6.7 6.5  ALBUMIN 2.5* 2.4*    CBG: No results for input(s): GLUCAP in the last 168 hours.   No results found for this or any previous visit (from the past 240 hour(s)).       Radiology Studies: MR THORACIC SPINE W WO CONTRAST  Result Date: 06/18/2021 CLINICAL DATA:  Initial evaluation for acute back pain, trauma. EXAM: MRI THORACIC WITHOUT AND WITH CONTRAST TECHNIQUE: Multiplanar and multiecho pulse sequences of the thoracic spine were obtained without and with intravenous contrast. CONTRAST:  8.25m GADAVIST GADOBUTROL 1 MMOL/ML IV SOLN COMPARISON:  None Available. FINDINGS: Alignment: Physiologic with preservation of the normal thoracic kyphosis. Vertebrae: There are innumerable T1 hypointense, stir hyperintense enhancing lesions seen throughout the thoracic spine, consistent with widespread osseous metastatic disease. There is involvement of essentially all levels. Posterior elements are involved at numerous levels as well. Associated pathologic fracture at T11 with up to 35% height loss. Evidence for early epidural extension into the ventral epidural space at T7, T9, and T11. Prominent extra osseous extension with soft tissue component seen about the left rib and transverse process of T7, measuring approximately 3.3 x 2.4 cm (series 20, image 20). Multiple lesion seen involving the partially visualized posterior ribs as well. No significant or high-grade spinal stenosis at this time. Cord: Early epidural extension at the levels of T7, T9, and T11. No high-grade spinal stenosis or frank cord impingement. Cord itself is normal in caliber in appearance. No abnormal intramedullary enhancement. Paraspinal and other soft tissues: Extraosseous extension with associated soft tissue  component about the left transverse process of T7 as above. More mild extraosseous extension also noted on the right at T4 and on the left at T9. Innumerable lesions seen throughout the partially visualized liver, likely reflecting metastatic disease. Disc levels: No significant underlying disc pathology. No high-grade spinal stenosis. Mild foraminal narrowing on the right at T6-7 and T11-12 related to extraosseous extension of tumor. Foramina otherwise remain patent at this time. IMPRESSION: 1. Widespread osseous metastatic disease throughout the thoracic spine as above. 2. Associated pathologic fracture at T11 with up to 35% height loss. 3. Evidence for early epidural extension at the levels of T7, T9, and T11. No high-grade spinal stenosis or frank cord impingement at this time. 4. Innumerable lesions throughout the partially visualized liver, likely reflecting metastatic disease. Electronically Signed   By: BJeannine BogaM.D.   On: 06/18/2021 01:07   MR Lumbar Spine W Wo Contrast  Result Date: 06/18/2021 CLINICAL DATA:  Initial evaluation for trauma, severe back pain. EXAM: MRI LUMBAR SPINE WITHOUT AND  WITH CONTRAST TECHNIQUE: Multiplanar and multiecho pulse sequences of the lumbar spine were obtained without and with intravenous contrast. CONTRAST:  8.46m GADAVIST GADOBUTROL 1 MMOL/ML IV SOLN COMPARISON:  None Available. FINDINGS: Segmentation: Standard. Lowest well-formed disc space labeled the L5-S1 level. Alignment: Physiologic with preservation of the normal lumbar lordosis. No listhesis. Vertebrae: Widespread osseous metastatic disease seen throughout the lumbar spine as well as the visualized sacrum and pelvis. There is involvement of essentially all levels. Scattered posterior element involvement seen at numerable levels as well. Associated pathologic fracture at L1 with mild 20% height loss. Evidence for early epidural extension of tumor into the ventral epidural space at L1 and L5 (series 27,  images 9, 7). Conus medullaris and cauda equina: Conus extends to the L1 level. Conus and cauda equina appear normal. No abnormal intramedullary enhancement. Paraspinal and other soft tissues: Mild extraosseous extension of tumor with involvement of the adjacent paraspinous soft tissues noted at multiple levels, most notable at the right transverse process of L3 (series 32, image 19). Scattered edema within the lower posterior paraspinous musculature likely reactive. Enlarged aortocaval lymph nodes measuring up to 1.5 cm in short axis, concerning for nodal metastatic disease (series 29, images 5, 10). Disc levels: T12-L1: Pathologic compression fracture of L1 with mild epidural extension of tumor. Secondary flattening of the ventral thecal sac, eccentric to the left. No significant spinal stenosis at this time. Foramina remain patent. L1-2:  Unremarkable. L2-3: Small left extraforaminal disc protrusion with annular fissure. Mild facet hypertrophy. No stenosis. L3-4: Disc desiccation with mild disc bulge. Mild facet hypertrophy. No stenosis. L4-5: Disc desiccation with mild disc bulge. Mild to moderate facet hypertrophy. No significant stenosis. L5-S1: Degenerative intervertebral disc space narrowing with diffuse disc bulge and reactive endplate change. Epidural extension of tumor into the right ventral epidural space (series 29, image 33). Mild to moderate bilateral facet hypertrophy. Mild narrowing of the right lateral recess without significant spinal stenosis. Mild left L5 foraminal narrowing. Right neural foramen remains patent. IMPRESSION: 1. Widespread osseous metastatic disease throughout the lumbar spine as well as the visualized sacrum and pelvis. 2. Associated pathologic fracture of L1 with mild 20% height loss. Evidence for early epidural extension of tumor into the ventral epidural space at L1 and L5. No significant spinal stenosis at this time. 3. Enlarged aortocaval lymph nodes, concerning for nodal  metastatic disease. 4. Underlying mild multilevel degenerative spondylosis as above, most pronounced at L5-S1. Electronically Signed   By: BJeannine BogaM.D.   On: 06/18/2021 01:16   CT CHEST ABDOMEN PELVIS W CONTRAST  Result Date: 06/18/2021 CLINICAL DATA:  Metastatic disease.  Staging. EXAM: CT CHEST, ABDOMEN, AND PELVIS WITH CONTRAST TECHNIQUE: Multidetector CT imaging of the chest, abdomen and pelvis was performed following the standard protocol during bolus administration of intravenous contrast. RADIATION DOSE REDUCTION: This exam was performed according to the departmental dose-optimization program which includes automated exposure control, adjustment of the mA and/or kV according to patient size and/or use of iterative reconstruction technique. CONTRAST:  1076mOMNIPAQUE IOHEXOL 300 MG/ML  SOLN COMPARISON:  MRI thoracic and lumbar spine 06/18/2021 FINDINGS: CT CHEST FINDINGS Cardiovascular: The heart size appears within normal limits. No pericardial effusion identified. Aortic atherosclerosis and coronary artery calcifications. Mediastinum/Nodes: Thyroid gland, trachea and esophagus are unremarkable. Subcarinal lymph node measures 1.5 cm, image 23/3. No enlarged axillary, supraclavicular, or hilar lymph nodes. Lungs/Pleura: No pleural effusion. Mild changes of emphysema. No suspicious pulmonary nodule or mass. Musculoskeletal: Multifocal lytic bone metastases identified involving the  ribs, sternum and spine. Index lesions include: -expansile lesion with associated pathologic fracture involves the anterolateral aspect of the right second rib measuring 2.1 cm, image 19/5. -lesion involving the sternal manubrium measures 0.9 cm, 26/5. -lesion involving the T11 vertebra measures 3.6 cm and extends into the right posterior elements, image 125/5. Associated pathologic compression fracture is noted with loss of approximately 25% of the vertebral body height. (See thoracic spine MRI for further details  regarding the thoracic spine metastasis.) CT ABDOMEN PELVIS FINDINGS Hepatobiliary: Diffuse liver metastases are identified throughout both lobes of the liver. Lesions are too numerous to count. Index lesions include: -segment 2 lesion measuring 2.4 x 2.0 cm, image 44/3. -partially exophytic lesion involving segment 6 measures 2.1 x 1.7 cm, image 62/3. -segment 4B lesion measures 2.1 x 1.4 cm, image 5/3. The gallbladder appears surgically absent. Pancreas: Unremarkable. No pancreatic ductal dilatation or surrounding inflammatory changes. Spleen: Normal in size without focal abnormality. Adrenals/Urinary Tract: Adrenal glands are unremarkable. Kidneys are normal, without renal calculi, focal lesion, or hydronephrosis. Bladder is unremarkable. Stomach/Bowel: Stomach appears normal. No signs of small bowel wall thickening, inflammation, or distension. Circumferential mass is identified involving the mid to distal ascending colon measures 4.4 x 5.2 by 8.9 cm, image 67/3. There is significant luminal narrowing involving the affected segment without signs of bowel obstruction. Transmural extension of tumor is identified with increased pericolonic soft tissue infiltration. Vascular/Lymphatic: Aortic atherosclerosis without aneurysm. Upper abdominal adenopathy is identified: -portacaval lymph node measures 2.3 cm short axis, image 53/3. -porta hepatic lymph node measures 2.3 cm, image 51/7. -ileocolic mesenteric lymph node measures 2.9 cm, image 70/3. No pelvic or inguinal adenopathy. Reproductive: Mild prostate gland enlargement. Other: No ascites. Peritoneal nodule within the left hemiabdomen is identified measuring 8 mm, image 70/3. Musculoskeletal: Multifocal lytic bone metastases identified involving the lumbar spine and bony pelvis. Index lesions include: -L2 vertebral body lesion measures 2.7 cm, image 72/7. -left inferior pubic rami lesion measures 1.5 cm, image 130/3. -right acetabular lesion measures 0.9 cm, image  111/3. Bilateral proximal femur lesions are identified: -Lesion involving the lesser trochanter of the proximal left femur measures 1.7 cm, image 127/3. -intertrochanteric lesion involving the proximal right femur measures 2.3 cm, image 126/3. IMPRESSION: 1. 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 Transmural extension of tumor is identified with increased pericolonic soft tissue infiltration. There is significant luminal narrowing involving the affected segment of colon without signs of high-grade bowel obstruction. 2. Extensive liver metastases. 3. Upper abdominal and mesenteric adenopathy compatible with metastatic adenopathy. Enlarged subcarinal lymph node compatible with metastatic disease. Please see report from MRI of the thoracic and lumbar spine, also performed today. 4. There are lytic lesions involving bilateral proximal femurs which may be of orthopedic significance. 5. Coronary artery calcifications noted. 6. Aortic Atherosclerosis (ICD10-I70.0) and Emphysema (ICD10-J43.9). Electronically Signed   By: Kerby Moors M.D.   On: 06/18/2021 05:36   DG Chest Portable 1 View  Result Date: 06/17/2021 CLINICAL DATA:  Cough with leukocytosis. EXAM: PORTABLE CHEST 1 VIEW COMPARISON:  None Available. FINDINGS: Multiple overlying radiopaque cardiac lead wires are noted. The heart size and mediastinal contours are within normal limits. Both lungs are clear. An ill-defined lytic area is seen involving the lateral aspect of the second left rib. IMPRESSION: 1. No acute infiltrate or pleural effusion. 2. Ill-defined lytic area involving the lateral aspect of the second left rib. Further evaluation with nonemergent chest CT is recommended. Electronically Signed   By: Joyce Gross.D.  On: 06/17/2021 22:41        Scheduled Meds:  Continuous Infusions:  lactated ringers 125 mL/hr at 06/18/21 0744     LOS: 0 days       Phillips Climes, MD [facility]   To  contact the attending provider between 7A-7P or the covering provider during after hours 7P-7A, please log into the web site [email or web address] and access using universal [place] password for that web site. If you do not have the password, please call the hospital operator.  06/18/2021, 3:16 PM

## 2021-06-18 NOTE — Assessment & Plan Note (Addendum)
See discussion by previous attending.  Goal status changed to DNR/DNI.   -Palliative medicine following.

## 2021-06-18 NOTE — Assessment & Plan Note (Addendum)
Recent Labs    06/17/21 2138 06/18/21 0810 06/19/21 0232 06/20/21 0359 06/21/21 0248  HGB 11.4* 11.4* 12.4* 11.9* 10.4*  Likely anemia of chronic disease.  Slight drop for likely from IV fluid. -Monitor

## 2021-06-18 NOTE — Assessment & Plan Note (Addendum)
Concern about metastatic colon cancer.  Imaging revealed ascending colon mass with liver and vertebral lesions as well as T11 and L1 pathologic fractures. -S/p US guided liver biopsy on 6/5.  Pathology with poorly differentiated adenocarcinoma -S/p T11 and L1 biopsy as well as kyphoplasty on 6/7. Path with metastatic high-grade neuroendocrine carcinoma, favor small cell  type -Simulation and palliative radiation 6/8>> -Pain control with fentanyl patch, oxycodone and IV Dilaudid -Bowel regimen -Transfer to [facility].

## 2021-06-18 NOTE — H&P (Signed)
History and Physical    Patient: Raymond Henderson MRN: [id number] DOA: 06/17/2021  Date of Service: the patient was seen and examined on 06/18/2021  Patient coming from: Home  Chief Complaint:  Chief Complaint  Patient presents with   Fall   Back Pain    HPI:   71 year old male with no past medical history who presents to [facility] emergency department with complaints of back pain.  Patient explains that approximately 5 weeks ago he began to experience back pain.  Back pain is mainly located in the midline thoracic region.  Pain was initially mild in intensity but rapidly became more more severe.  Pain is sharp in quality, severe in intensity and radiates distally.  Pain is worse with movement and particularly with weightbearing.  Patient denies any associated weakness of the extremities.  Patient denies any fever, dysuria nausea or vomiting.    Upon further questioning patient reports over the same span of time sudden constipation for approximately the past 5 weeks with the patient only being able to move his bowels once every 10 days.  Patient additionally reports an unintentional 15 pound weight loss over the same span of time as well as intermittent night sweats.  Patient denies blood in the stool.    Patient reports that he last had a colonoscopy approximately 15 years ago in [place] and has not seen a PCP in approximately that many years.  Patient reports going to a local urgent care clinic earlier in the course of his illness.  Patient was initially told that he had muscle strain "and possibly pneumonia" but was never prescribed antibiotic.  Last week the patient presented to establish a new primary care provider at the local clinic.  His new PCP expressed concern about his symptoms and ordered an extensive radiographic work-up (which he hasnt had done yet) and stated that if his pain continues to worsen to go to the emergency department.  On Sunday patient's pain got to the  point where patient could barely get out of bed, resulting in a fall and prompting his presentation to [facility] emergency department for evaluation.  Upon arrival to the emergency department initial work-up included MRI imaging of the thoracolumbar spine which identified widespread osseous metastatic disease seen in the lumbar spine sacrum and pelvis with an associated pathologic L1 fracture without foraminal stenosis.  Due to intractable pain, inability to ambulate and suspected metastatic disease to the hospital scrip was then called to assess the patient for admission to the hospital.  Review of Systems: Review of Systems  Constitutional:  Positive for malaise/fatigue and weight loss.  Gastrointestinal:  Positive for constipation.  Musculoskeletal:  Positive for back pain and falls.    Past Medical History:  Diagnosis Date   No pertinent past medical history     History reviewed. No pertinent surgical history.  Social History:  reports that he has never smoked. He has never used smokeless tobacco. He reports that he does not drink alcohol and does not use drugs.  Allergies  Allergen Reactions   Penicillins     Family History  Problem Relation Age of Onset   Heart disease Neg Hx     Prior to Admission medications   Not on File    Physical Exam:  Vitals:   06/17/21 2245 06/18/21 0110 06/18/21 0200 06/18/21 0300  BP: (!) 147/81 (!) 149/90 (!) 129/94 (!) 150/75  Pulse: (!) 102 (!) 101 97 (!) 117  Resp: (!) 30 20 18  20  Temp:      TempSrc:      SpO2: 94% 97% 93% 95%    Constitutional: Awake alert and oriented x3, no associated distress.   Skin: no rashes, no lesions, good skin turgor noted. Eyes: Pupils are equally reactive to light.  No evidence of scleral icterus or conjunctival pallor.  ENMT: Moist mucous membranes noted.  Posterior pharynx clear of any exudate or lesions.   Neck: normal, supple, no masses, no thyromegaly.  No evidence of jugular venous  distension.   Respiratory: clear to auscultation bilaterally, no wheezing, no crackles. Normal respiratory effort. No accessory muscle use.  Cardiovascular: Regular rate and rhythm, no murmurs / rubs / gallops. No extremity edema. 2+ pedal pulses. No carotid bruits.  Chest:   Diffuse chest tenderness without crepitus or deformity. Back:   Significant tenderness of the thoracolumbar spine without crepitus or deformity.   Abdomen: Vague generalized abdominal tenderness.  Abdomen is soft.  Notable abdominal fullness in the right abdomen.  Positive bowel sounds noted in all quadrants.   Musculoskeletal: No joint deformity upper and lower extremities. Good ROM, no contractures. Normal muscle tone.  Neurologic: CN 2-12 grossly intact. Sensation intact.  Patient moving all 4 extremities spontaneously although strength of the bilateral lower extremities is limited due to pain.  Patient is following all commands.  Patient is responsive to verbal stimuli.   Psychiatric: Patient is tearful with a sad mood with appropriate affect.  Patient seems to possess insight as to their current situation.    Data Reviewed:  I have personally reviewed and interpreted labs, imaging.  Significant findings are:    MRI thoracolumbar spine revealing evidence of widespread osseous metastatic disease with associated pathologic fracture at T11 and L1 with evidence of early epidural extension of tumor without foraminal stenosis. Urinalysis unremarkable. CBC revealing white blood cell count of 17, hemoglobin 11.4, hematocrit 36.4, platelet count 346. Chemistry revealing sodium 132, chloride 96, glucose 110, BUN 24, creatinine 1.01, calcium 10.5.    Assessment and Plan: * Metastatic cancer [facility]) Patient presenting with a 5-week history of increasing back pain, constipation, unintentional weight loss and night sweats CT and MRI imaging identified evidence of diffuse metastatic malignancy with lesions noted throughout the  osseous structures and liver with what appears to be a sizable mass in the ascending colon that may very well be the primary Patient unfortunately has not seen a primary care provider in approximately 15 years and has not had a colonoscopy since about that time Case discussed with Dr. Marin Olp with oncology who recommends obtaining a PSA and CEA followed by gastroenterology consultation for endoscopic evaluation and biopsy of the mass to establish the diagnosis.  Once this is done oncology can get involved if patient wishes to proceed. Secure chat message sent to gastroenterology requesting consultation.  Patient will be kept n.p.o. in the meantime.  Pathologic fracture of lumbar vertebra, initial encounter Patient appears to have pathologic fractures of both L1 and T11, likely secondary to metastatic spread For the time being, managing with as needed opiate-based analgesics which will be titrated upwards generously as necessary to achieve pain control Patient also suffering from concurrent hypercalcemia likely secondary to diffuse bony destruction which is being managed with intravenous fluids PT evaluation ordered Once a diagnosis has been established can coordinate with oncology as to whether localized radiation or kyphoplasty have a role in managing these pathologic fractures. Vitamin D level ordered  Pathologic fracture of thoracic vertebrae, initial encounter Please see assessment  and plan above  Hypercalcemia Mild hypercalcemia noted on chemistry Likely secondary to diffuse metastatic bony destruction Hydrating patient with intravenous isotonic fluids Monitoring calcium levels with serial chemistries No need for zoledronic acid at this time  Normocytic anemia Patient exhibiting a modest anemia on initial CBC Source is likely patient's a sending colonic mass Patient denies any evidence of gross bleeding Considering how hypovolemic this patient is I suspect that the patient's  hemoglobin will drop precipitously with serial CBCs Obtaining iron panel Gastroenterology consultation as noted above Monitoring hemoglobin and hematocrit with serial CBCs.  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 Patient also wishes to learn more about potential hospice care and is requesting a palliative care consultation after it was offered Patient wishes to be DNR and is of sound mind to make this decision.  Order has been placed.       Code Status:  DNR  code status decision has been confirmed with: patient Family Communication: deferred   Consults: Secure chat Message Sent to Dr. Lorenso Courier with Gastoenterology  Severity of Illness:  The appropriate patient status for this patient is OBSERVATION. Observation status is judged to be reasonable and necessary in order to provide the required intensity of service to ensure the patient's safety. The patient's presenting symptoms, physical exam findings, and initial radiographic and laboratory data in the context of their medical condition is felt to place them at decreased risk for further clinical deterioration. Furthermore, it is anticipated that the patient will be medically stable for discharge from the hospital within 2 midnights of admission.   Author:  Vernelle Emerald MD  06/18/2021 7:58 AM

## 2021-06-18 NOTE — Assessment & Plan Note (Signed)
·   Please see assessment and plan above °

## 2021-06-18 NOTE — Consult Note (Addendum)
[place]  [phone]) [phone] [phone]   MEDICAL ONCOLOGY - INITIAL CONSULTATION  Referral MD: Dr. Phillips Climes  Reason for Referral: Abnormal imaging concerning for metastatic colon cancer  HPI: Raymond Henderson is a 71 year old male with no significant past medical history.  He presented to the emergency department with back pain.  About 5 weeks ago, the back pain started.  Mainly located in the midline thoracic region.  Pain has worsened in intensity and is now sharp in quality and radiates distally.  Pain is worse with movement.  Also over the past 5 weeks, he has only been able to move his bowels once every 10 days and has had a 15 pound weight loss over the past 5 weeks.  Last colonoscopy was reportedly 15 years ago in [place].  He has not seen a primary care provider in many years.  He went to a local urgent care due to his symptoms and was initially told that he had a muscle strain and possible pneumonia but was never given antibiotics.  He establish care with a new primary care provider last week and due to his symptoms, radiographic work-up was ordered which has not yet been performed.  Due to continued worsening of his symptoms, he presented to the emergency department.  Admission lab work showed a WBC of 17.0, hemoglobin 11.6, BUN 24, calcium 10.5, albumin 2.5 (Corrected calcium 11.3), AST 73, ALT 46, alk phos 725, T. bili 2.0.  An MRI of the thoracic spine was obtained which showed widespread osseous metastatic disease throughout the thoracic spine, pathologic fracture at T11, early epidural extension at levels T7, T9, and T11, no high-grade spinal stenosis or frank cord impingement, innumerable lesions throughout the partially visualized liver.  MRI of the lumbar spine showed widespread osseous metastatic disease throughout the lumbar spine as well as visualized sacrum and pelvis, associated pathologic fracture of L1 with evidence for early epidural extension  of tumor into the ventral epidural space at L1 and L5.  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  A PSA was ordered which was normal.  CEA ordered and is pending.  The patient has been seen by palliative care who has expressed that he would not consider chemotherapy.  IR has seen the patient and plans for ultrasound-guided biopsy later today.  GI has been consulted and full note pending at the time of my dictation.  I met with the patient and his friend who was at the bedside while he was in the emergency department.  He reports anorexia, weight loss of about 15 pounds, and worsening/severe back pain over the past 5 weeks.  He also reports constipation and has not moved his bowels in about 10 days.  He is not having any fevers, chills, headaches, dizziness.  He has had some chest pain that radiates across to his chest intermittently but likely radiating from back.  He reports some shortness of breath intermittently and also reports a nonproductive cough.  He is not having any abdominal pain, nausea, vomiting, melena, hematochezia.  The patient is divorced and lives alone.  He has a friend who can help  him.  He has 2 children-one in [place] and one in [place].  He has a history of smoking 1-1/2 packs of cigarettes per day for about 40 years.  Drinks wine on occasion.  Family history significant for a mother with breast cancer.  Medical oncology was asked see the patient make recommendations  regarding his abnormal imaging findings.  Past Medical History:  Diagnosis Date   No pertinent past medical history   :  History reviewed. No pertinent surgical history.:   Current Facility-Administered Medications  Medication Dose Route Frequency Provider Last Rate Last Admin   acetaminophen (TYLENOL) tablet 650 mg  650 mg Oral Q6H PRN Shalhoub, Sherryll Burger, MD       Or   acetaminophen (TYLENOL) suppository 650 mg  650 mg Rectal Q6H PRN Shalhoub, Sherryll Burger, MD       iohexol (OMNIPAQUE) 300 MG/ML solution 100 mL  100 mL Intravenous Once PRN Shalhoub, Sherryll Burger, MD       lactated ringers infusion   Intravenous Continuous Shalhoub, Sherryll Burger, MD 125 mL/hr at 06/18/21 0744 New Bag at 06/18/21 0744   oxyCODONE-acetaminophen (PERCOCET/ROXICET) 5-325 MG per tablet 1 tablet  1 tablet Oral Q4H PRN Vernelle Emerald, MD       Or   morphine (PF) 2 MG/ML injection 2 mg  2 mg Intravenous Q4H PRN Shalhoub, Sherryll Burger, MD       ondansetron [facility]) tablet 4 mg  4 mg Oral Q6H PRN Shalhoub, Sherryll Burger, MD       Or   ondansetron [facility]) injection 4 mg  4 mg Intravenous Q6H PRN Shalhoub, Sherryll Burger, MD       polyethylene glycol (MIRALAX / GLYCOLAX) packet 17 g  17 g Oral Daily PRN Shalhoub, Sherryll Burger, MD       No current outpatient medications on file.      Allergies  Allergen Reactions   Penicillins   :   Family History  Problem Relation Age of Onset   Heart disease Neg Hx   :   Social History   Socioeconomic History   Marital status: Married    Spouse name: Not on file   Number of children: Not on file   Years of education: Not on file   Highest education level: Not on file  Occupational History   Not on file  Tobacco Use   Smoking status: Never   Smokeless tobacco: Never  Substance and Sexual Activity   Alcohol use: Never   Drug use: Never   Sexual activity: Not on file  Other Topics Concern   Not on file  Social History Narrative   Not on file   Social Determinants of Health    Financial Resource Strain: Not on file  Food Insecurity: Not on file  Transportation Needs: Not on file  Physical Activity: Not on file  Stress: Not on file  Social Connections: Not on file  Intimate Partner Violence: Not on file  :  Review of Systems: A comprehensive 14 point review of systems was negative except as noted in the HPI.  Exam: Patient Vitals for the past 24 hrs:  BP Temp Temp src Pulse Resp SpO2  06/18/21 0300 (!) 150/75 -- -- (!) 117 20 95 %  06/18/21 0200 (!) 129/94 -- -- 97 18 93 %  06/18/21 0110 (!) 149/90 -- -- (!) 101 20 97 %  06/17/21 2245 (!) 147/81 -- -- (!) 102 (!) 30 94 %  06/17/21 2230 (!) 146/85 -- -- Marland Kitchen  106 (!) 27 96 %  06/17/21 2145 (!) 150/87 -- -- (!) 102 (!) 27 96 %  06/17/21 2115 -- -- -- (!) 101 (!) 29 95 %  06/17/21 2023 136/86 98.3 F (36.8 C) Oral 97 16 98 %    General: Laying on stretcher, no distress. Eyes:  no scleral icterus.   ENT:  There were no oropharyngeal lesions.  .   Lymphatics:  Negative cervical, supraclavicular or axillary adenopathy.   Respiratory: lungs were clear bilaterally without wheezing or crackles.   Cardiovascular:  Regular rate and rhythm, S1/S2, without murmur, rub or gallop.  There was no pedal edema.   GI: Positive bowel sounds, soft, no tenderness with palpation.   Skin exam was without echymosis, petichae.   Neuro exam was nonfocal. Patient was alert and oriented.  Attention was good.   Language was appropriate.  Mood was normal without depression.  Speech was not pressured.  Thought content was not tangential.     Lab Results  Component Value Date   WBC 17.0 (H) 06/17/2021   HGB 11.4 (L) 06/17/2021   HCT 36.4 (L) 06/17/2021   PLT 346 06/17/2021   GLUCOSE 110 (H) 06/17/2021   ALT 46 (H) 06/18/2021   AST 73 (H) 06/18/2021   NA 132 (L) 06/17/2021   K 4.0 06/17/2021   CL 96 (L) 06/17/2021   CREATININE 1.01 06/17/2021   BUN 24 (H) 06/17/2021   CO2 25 06/17/2021    MR THORACIC SPINE W WO  CONTRAST  Result Date: 06/18/2021 CLINICAL DATA:  Initial evaluation for acute back pain, trauma. EXAM: MRI THORACIC WITHOUT AND WITH CONTRAST TECHNIQUE: Multiplanar and multiecho pulse sequences of the thoracic spine were obtained without and with intravenous contrast. CONTRAST:  8.13m GADAVIST GADOBUTROL 1 MMOL/ML IV SOLN COMPARISON:  None Available. FINDINGS: Alignment: Physiologic with preservation of the normal thoracic kyphosis. Vertebrae: There are innumerable T1 hypointense, stir hyperintense enhancing lesions seen throughout the thoracic spine, consistent with widespread osseous metastatic disease. There is involvement of essentially all levels. Posterior elements are involved at numerous levels as well. Associated pathologic fracture at T11 with up to 35% height loss. Evidence for early epidural extension into the ventral epidural space at T7, T9, and T11. Prominent extra osseous extension with soft tissue component seen about the left rib and transverse process of T7, measuring approximately 3.3 x 2.4 cm (series 20, image 20). Multiple lesion seen involving the partially visualized posterior ribs as well. No significant or high-grade spinal stenosis at this time. Cord: Early epidural extension at the levels of T7, T9, and T11. No high-grade spinal stenosis or frank cord impingement. Cord itself is normal in caliber in appearance. No abnormal intramedullary enhancement. Paraspinal and other soft tissues: Extraosseous extension with associated soft tissue component about the left transverse process of T7 as above. More mild extraosseous extension also noted on the right at T4 and on the left at T9. Innumerable lesions seen throughout the partially visualized liver, likely reflecting metastatic disease. Disc levels: No significant underlying disc pathology. No high-grade spinal stenosis. Mild foraminal narrowing on the right at T6-7 and T11-12 related to extraosseous extension of tumor. Foramina otherwise  remain patent at this time. IMPRESSION: 1. Widespread osseous metastatic disease throughout the thoracic spine as above. 2. Associated pathologic fracture at T11 with up to 35% height loss. 3. Evidence for early epidural extension at the levels of T7, T9, and T11. No high-grade spinal stenosis or frank cord impingement at this time. 4. Innumerable lesions  throughout the partially visualized liver, likely reflecting metastatic disease. Electronically Signed   By: Jeannine Boga M.D.   On: 06/18/2021 01:07   MR Lumbar Spine W Wo Contrast  Result Date: 06/18/2021 CLINICAL DATA:  Initial evaluation for trauma, severe back pain. EXAM: MRI LUMBAR SPINE WITHOUT AND WITH CONTRAST TECHNIQUE: Multiplanar and multiecho pulse sequences of the lumbar spine were obtained without and with intravenous contrast. CONTRAST:  8.2m GADAVIST GADOBUTROL 1 MMOL/ML IV SOLN COMPARISON:  None Available. FINDINGS: Segmentation: Standard. Lowest well-formed disc space labeled the L5-S1 level. Alignment: Physiologic with preservation of the normal lumbar lordosis. No listhesis. Vertebrae: Widespread osseous metastatic disease seen throughout the lumbar spine as well as the visualized sacrum and pelvis. There is involvement of essentially all levels. Scattered posterior element involvement seen at numerable levels as well. Associated pathologic fracture at L1 with mild 20% height loss. Evidence for early epidural extension of tumor into the ventral epidural space at L1 and L5 (series 27, images 9, 7). Conus medullaris and cauda equina: Conus extends to the L1 level. Conus and cauda equina appear normal. No abnormal intramedullary enhancement. Paraspinal and other soft tissues: Mild extraosseous extension of tumor with involvement of the adjacent paraspinous soft tissues noted at multiple levels, most notable at the right transverse process of L3 (series 32, image 19). Scattered edema within the lower posterior paraspinous musculature  likely reactive. Enlarged aortocaval lymph nodes measuring up to 1.5 cm in short axis, concerning for nodal metastatic disease (series 29, images 5, 10). Disc levels: T12-L1: Pathologic compression fracture of L1 with mild epidural extension of tumor. Secondary flattening of the ventral thecal sac, eccentric to the left. No significant spinal stenosis at this time. Foramina remain patent. L1-2:  Unremarkable. L2-3: Small left extraforaminal disc protrusion with annular fissure. Mild facet hypertrophy. No stenosis. L3-4: Disc desiccation with mild disc bulge. Mild facet hypertrophy. No stenosis. L4-5: Disc desiccation with mild disc bulge. Mild to moderate facet hypertrophy. No significant stenosis. L5-S1: Degenerative intervertebral disc space narrowing with diffuse disc bulge and reactive endplate change. Epidural extension of tumor into the right ventral epidural space (series 29, image 33). Mild to moderate bilateral facet hypertrophy. Mild narrowing of the right lateral recess without significant spinal stenosis. Mild left L5 foraminal narrowing. Right neural foramen remains patent. IMPRESSION: 1. Widespread osseous metastatic disease throughout the lumbar spine as well as the visualized sacrum and pelvis. 2. Associated pathologic fracture of L1 with mild 20% height loss. Evidence for early epidural extension of tumor into the ventral epidural space at L1 and L5. No significant spinal stenosis at this time. 3. Enlarged aortocaval lymph nodes, concerning for nodal metastatic disease. 4. Underlying mild multilevel degenerative spondylosis as above, most pronounced at L5-S1. Electronically Signed   By: BJeannine BogaM.D.   On: 06/18/2021 01:16   CT CHEST ABDOMEN PELVIS W CONTRAST  Result Date: 06/18/2021 CLINICAL DATA:  Metastatic disease.  Staging. EXAM: CT CHEST, ABDOMEN, AND PELVIS WITH CONTRAST TECHNIQUE: Multidetector CT imaging of the chest, abdomen and pelvis was performed following the standard  protocol during bolus administration of intravenous contrast. RADIATION DOSE REDUCTION: This exam was performed according to the departmental dose-optimization program which includes automated exposure control, adjustment of the mA and/or kV according to patient size and/or use of iterative reconstruction technique. CONTRAST:  1044mOMNIPAQUE IOHEXOL 300 MG/ML  SOLN COMPARISON:  MRI thoracic and lumbar spine 06/18/2021 FINDINGS: CT CHEST FINDINGS Cardiovascular: The heart size appears within normal limits. No pericardial effusion identified. Aortic  atherosclerosis and coronary artery calcifications. Mediastinum/Nodes: Thyroid gland, trachea and esophagus are unremarkable. Subcarinal lymph node measures 1.5 cm, image 23/3. No enlarged axillary, supraclavicular, or hilar lymph nodes. Lungs/Pleura: No pleural effusion. Mild changes of emphysema. No suspicious pulmonary nodule or mass. Musculoskeletal: Multifocal lytic bone metastases identified involving the ribs, sternum and spine. Index lesions include: -expansile lesion with associated pathologic fracture involves the anterolateral aspect of the right second rib measuring 2.1 cm, image 19/5. -lesion involving the sternal manubrium measures 0.9 cm, 26/5. -lesion involving the T11 vertebra measures 3.6 cm and extends into the right posterior elements, image 125/5. Associated pathologic compression fracture is noted with loss of approximately 25% of the vertebral body height. (See thoracic spine MRI for further details regarding the thoracic spine metastasis.) CT ABDOMEN PELVIS FINDINGS Hepatobiliary: Diffuse liver metastases are identified throughout both lobes of the liver. Lesions are too numerous to count. Index lesions include: -segment 2 lesion measuring 2.4 x 2.0 cm, image 44/3. -partially exophytic lesion involving segment 6 measures 2.1 x 1.7 cm, image 62/3. -segment 4B lesion measures 2.1 x 1.4 cm, image 5/3. The gallbladder appears surgically absent.  Pancreas: Unremarkable. No pancreatic ductal dilatation or surrounding inflammatory changes. Spleen: Normal in size without focal abnormality. Adrenals/Urinary Tract: Adrenal glands are unremarkable. Kidneys are normal, without renal calculi, focal lesion, or hydronephrosis. Bladder is unremarkable. Stomach/Bowel: Stomach appears normal. No signs of small bowel wall thickening, inflammation, or distension. Circumferential mass is identified involving the mid to distal ascending colon measures 4.4 x 5.2 by 8.9 cm, image 67/3. There is significant luminal narrowing involving the affected segment without signs of bowel obstruction. Transmural extension of tumor is identified with increased pericolonic soft tissue infiltration. Vascular/Lymphatic: Aortic atherosclerosis without aneurysm. Upper abdominal adenopathy is identified: -portacaval lymph node measures 2.3 cm short axis, image 53/3. -porta hepatic lymph node measures 2.3 cm, image 79/0. -ileocolic mesenteric lymph node measures 2.9 cm, image 70/3. No pelvic or inguinal adenopathy. Reproductive: Mild prostate gland enlargement. Other: No ascites. Peritoneal nodule within the left hemiabdomen is identified measuring 8 mm, image 70/3. Musculoskeletal: Multifocal lytic bone metastases identified involving the lumbar spine and bony pelvis. Index lesions include: -L2 vertebral body lesion measures 2.7 cm, image 72/7. -left inferior pubic rami lesion measures 1.5 cm, image 130/3. -right acetabular lesion measures 0.9 cm, image 111/3. Bilateral proximal femur lesions are identified: -Lesion involving the lesser trochanter of the proximal left femur measures 1.7 cm, image 127/3. -intertrochanteric lesion involving the proximal right femur measures 2.3 cm, image 126/3. IMPRESSION: 1. 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 Transmural extension of tumor is identified with increased pericolonic soft tissue infiltration. There is significant  luminal narrowing involving the affected segment of colon without signs of high-grade bowel obstruction. 2. Extensive liver metastases. 3. Upper abdominal and mesenteric adenopathy compatible with metastatic adenopathy. Enlarged subcarinal lymph node compatible with metastatic disease. Please see report from MRI of the thoracic and lumbar spine, also performed today. 4. There are lytic lesions involving bilateral proximal femurs which may be of orthopedic significance. 5. Coronary artery calcifications noted. 6. Aortic Atherosclerosis (ICD10-I70.0) and Emphysema (ICD10-J43.9). Electronically Signed   By: Kerby Moors M.D.   On: 06/18/2021 05:36   DG Chest Portable 1 View  Result Date: 06/17/2021 CLINICAL DATA:  Cough with leukocytosis. EXAM: PORTABLE CHEST 1 VIEW COMPARISON:  None Available. FINDINGS: Multiple overlying radiopaque cardiac lead wires are noted. The heart size and mediastinal contours are within normal limits. Both lungs are clear. An ill-defined  lytic area is seen involving the lateral aspect of the second left rib. IMPRESSION: 1. No acute infiltrate or pleural effusion. 2. Ill-defined lytic area involving the lateral aspect of the second left rib. Further evaluation with nonemergent chest CT is recommended. Electronically Signed   By: Virgina Norfolk M.D.   On: 06/17/2021 22:41     MR THORACIC SPINE W WO CONTRAST  Result Date: 06/18/2021 CLINICAL DATA:  Initial evaluation for acute back pain, trauma. EXAM: MRI THORACIC WITHOUT AND WITH CONTRAST TECHNIQUE: Multiplanar and multiecho pulse sequences of the thoracic spine were obtained without and with intravenous contrast. CONTRAST:  8.80m GADAVIST GADOBUTROL 1 MMOL/ML IV SOLN COMPARISON:  None Available. FINDINGS: Alignment: Physiologic with preservation of the normal thoracic kyphosis. Vertebrae: There are innumerable T1 hypointense, stir hyperintense enhancing lesions seen throughout the thoracic spine, consistent with widespread osseous  metastatic disease. There is involvement of essentially all levels. Posterior elements are involved at numerous levels as well. Associated pathologic fracture at T11 with up to 35% height loss. Evidence for early epidural extension into the ventral epidural space at T7, T9, and T11. Prominent extra osseous extension with soft tissue component seen about the left rib and transverse process of T7, measuring approximately 3.3 x 2.4 cm (series 20, image 20). Multiple lesion seen involving the partially visualized posterior ribs as well. No significant or high-grade spinal stenosis at this time. Cord: Early epidural extension at the levels of T7, T9, and T11. No high-grade spinal stenosis or frank cord impingement. Cord itself is normal in caliber in appearance. No abnormal intramedullary enhancement. Paraspinal and other soft tissues: Extraosseous extension with associated soft tissue component about the left transverse process of T7 as above. More mild extraosseous extension also noted on the right at T4 and on the left at T9. Innumerable lesions seen throughout the partially visualized liver, likely reflecting metastatic disease. Disc levels: No significant underlying disc pathology. No high-grade spinal stenosis. Mild foraminal narrowing on the right at T6-7 and T11-12 related to extraosseous extension of tumor. Foramina otherwise remain patent at this time. IMPRESSION: 1. Widespread osseous metastatic disease throughout the thoracic spine as above. 2. Associated pathologic fracture at T11 with up to 35% height loss. 3. Evidence for early epidural extension at the levels of T7, T9, and T11. No high-grade spinal stenosis or frank cord impingement at this time. 4. Innumerable lesions throughout the partially visualized liver, likely reflecting metastatic disease. Electronically Signed   By: BJeannine BogaM.D.   On: 06/18/2021 01:07   MR Lumbar Spine W Wo Contrast  Result Date: 06/18/2021 CLINICAL DATA:   Initial evaluation for trauma, severe back pain. EXAM: MRI LUMBAR SPINE WITHOUT AND WITH CONTRAST TECHNIQUE: Multiplanar and multiecho pulse sequences of the lumbar spine were obtained without and with intravenous contrast. CONTRAST:  8.582mGADAVIST GADOBUTROL 1 MMOL/ML IV SOLN COMPARISON:  None Available. FINDINGS: Segmentation: Standard. Lowest well-formed disc space labeled the L5-S1 level. Alignment: Physiologic with preservation of the normal lumbar lordosis. No listhesis. Vertebrae: Widespread osseous metastatic disease seen throughout the lumbar spine as well as the visualized sacrum and pelvis. There is involvement of essentially all levels. Scattered posterior element involvement seen at numerable levels as well. Associated pathologic fracture at L1 with mild 20% height loss. Evidence for early epidural extension of tumor into the ventral epidural space at L1 and L5 (series 27, images 9, 7). Conus medullaris and cauda equina: Conus extends to the L1 level. Conus and cauda equina appear normal. No abnormal intramedullary enhancement.  Paraspinal and other soft tissues: Mild extraosseous extension of tumor with involvement of the adjacent paraspinous soft tissues noted at multiple levels, most notable at the right transverse process of L3 (series 32, image 19). Scattered edema within the lower posterior paraspinous musculature likely reactive. Enlarged aortocaval lymph nodes measuring up to 1.5 cm in short axis, concerning for nodal metastatic disease (series 29, images 5, 10). Disc levels: T12-L1: Pathologic compression fracture of L1 with mild epidural extension of tumor. Secondary flattening of the ventral thecal sac, eccentric to the left. No significant spinal stenosis at this time. Foramina remain patent. L1-2:  Unremarkable. L2-3: Small left extraforaminal disc protrusion with annular fissure. Mild facet hypertrophy. No stenosis. L3-4: Disc desiccation with mild disc bulge. Mild facet hypertrophy. No  stenosis. L4-5: Disc desiccation with mild disc bulge. Mild to moderate facet hypertrophy. No significant stenosis. L5-S1: Degenerative intervertebral disc space narrowing with diffuse disc bulge and reactive endplate change. Epidural extension of tumor into the right ventral epidural space (series 29, image 33). Mild to moderate bilateral facet hypertrophy. Mild narrowing of the right lateral recess without significant spinal stenosis. Mild left L5 foraminal narrowing. Right neural foramen remains patent. IMPRESSION: 1. Widespread osseous metastatic disease throughout the lumbar spine as well as the visualized sacrum and pelvis. 2. Associated pathologic fracture of L1 with mild 20% height loss. Evidence for early epidural extension of tumor into the ventral epidural space at L1 and L5. No significant spinal stenosis at this time. 3. Enlarged aortocaval lymph nodes, concerning for nodal metastatic disease. 4. Underlying mild multilevel degenerative spondylosis as above, most pronounced at L5-S1. Electronically Signed   By: Jeannine Boga M.D.   On: 06/18/2021 01:16   CT CHEST ABDOMEN PELVIS W CONTRAST  Result Date: 06/18/2021 CLINICAL DATA:  Metastatic disease.  Staging. EXAM: CT CHEST, ABDOMEN, AND PELVIS WITH CONTRAST TECHNIQUE: Multidetector CT imaging of the chest, abdomen and pelvis was performed following the standard protocol during bolus administration of intravenous contrast. RADIATION DOSE REDUCTION: This exam was performed according to the departmental dose-optimization program which includes automated exposure control, adjustment of the mA and/or kV according to patient size and/or use of iterative reconstruction technique. CONTRAST:  162m OMNIPAQUE IOHEXOL 300 MG/ML  SOLN COMPARISON:  MRI thoracic and lumbar spine 06/18/2021 FINDINGS: CT CHEST FINDINGS Cardiovascular: The heart size appears within normal limits. No pericardial effusion identified. Aortic atherosclerosis and coronary artery  calcifications. Mediastinum/Nodes: Thyroid gland, trachea and esophagus are unremarkable. Subcarinal lymph node measures 1.5 cm, image 23/3. No enlarged axillary, supraclavicular, or hilar lymph nodes. Lungs/Pleura: No pleural effusion. Mild changes of emphysema. No suspicious pulmonary nodule or mass. Musculoskeletal: Multifocal lytic bone metastases identified involving the ribs, sternum and spine. Index lesions include: -expansile lesion with associated pathologic fracture involves the anterolateral aspect of the right second rib measuring 2.1 cm, image 19/5. -lesion involving the sternal manubrium measures 0.9 cm, 26/5. -lesion involving the T11 vertebra measures 3.6 cm and extends into the right posterior elements, image 125/5. Associated pathologic compression fracture is noted with loss of approximately 25% of the vertebral body height. (See thoracic spine MRI for further details regarding the thoracic spine metastasis.) CT ABDOMEN PELVIS FINDINGS Hepatobiliary: Diffuse liver metastases are identified throughout both lobes of the liver. Lesions are too numerous to count. Index lesions include: -segment 2 lesion measuring 2.4 x 2.0 cm, image 44/3. -partially exophytic lesion involving segment 6 measures 2.1 x 1.7 cm, image 62/3. -segment 4B lesion measures 2.1 x 1.4 cm, image 5/3. The gallbladder  appears surgically absent. Pancreas: Unremarkable. No pancreatic ductal dilatation or surrounding inflammatory changes. Spleen: Normal in size without focal abnormality. Adrenals/Urinary Tract: Adrenal glands are unremarkable. Kidneys are normal, without renal calculi, focal lesion, or hydronephrosis. Bladder is unremarkable. Stomach/Bowel: Stomach appears normal. No signs of small bowel wall thickening, inflammation, or distension. Circumferential mass is identified involving the mid to distal ascending colon measures 4.4 x 5.2 by 8.9 cm, image 67/3. There is significant luminal narrowing involving the affected  segment without signs of bowel obstruction. Transmural extension of tumor is identified with increased pericolonic soft tissue infiltration. Vascular/Lymphatic: Aortic atherosclerosis without aneurysm. Upper abdominal adenopathy is identified: -portacaval lymph node measures 2.3 cm short axis, image 53/3. -porta hepatic lymph node measures 2.3 cm, image 80/1. -ileocolic mesenteric lymph node measures 2.9 cm, image 70/3. No pelvic or inguinal adenopathy. Reproductive: Mild prostate gland enlargement. Other: No ascites. Peritoneal nodule within the left hemiabdomen is identified measuring 8 mm, image 70/3. Musculoskeletal: Multifocal lytic bone metastases identified involving the lumbar spine and bony pelvis. Index lesions include: -L2 vertebral body lesion measures 2.7 cm, image 72/7. -left inferior pubic rami lesion measures 1.5 cm, image 130/3. -right acetabular lesion measures 0.9 cm, image 111/3. Bilateral proximal femur lesions are identified: -Lesion involving the lesser trochanter of the proximal left femur measures 1.7 cm, image 127/3. -intertrochanteric lesion involving the proximal right femur measures 2.3 cm, image 126/3. IMPRESSION: 1. 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 Transmural extension of tumor is identified with increased pericolonic soft tissue infiltration. There is significant luminal narrowing involving the affected segment of colon without signs of high-grade bowel obstruction. 2. Extensive liver metastases. 3. Upper abdominal and mesenteric adenopathy compatible with metastatic adenopathy. Enlarged subcarinal lymph node compatible with metastatic disease. Please see report from MRI of the thoracic and lumbar spine, also performed today. 4. There are lytic lesions involving bilateral proximal femurs which may be of orthopedic significance. 5. Coronary artery calcifications noted. 6. Aortic Atherosclerosis (ICD10-I70.0) and Emphysema (ICD10-J43.9). Electronically  Signed   By: Kerby Moors M.D.   On: 06/18/2021 05:36   DG Chest Portable 1 View  Result Date: 06/17/2021 CLINICAL DATA:  Cough with leukocytosis. EXAM: PORTABLE CHEST 1 VIEW COMPARISON:  None Available. FINDINGS: Multiple overlying radiopaque cardiac lead wires are noted. The heart size and mediastinal contours are within normal limits. Both lungs are clear. An ill-defined lytic area is seen involving the lateral aspect of the second left rib. IMPRESSION: 1. No acute infiltrate or pleural effusion. 2. Ill-defined lytic area involving the lateral aspect of the second left rib. Further evaluation with nonemergent chest CT is recommended. Electronically Signed   By: Virgina Norfolk M.D.   On: 06/17/2021 22:41    Assessment and Plan:  1.  Abnormal imaging findings concerning for metastatic colon cancer 2.  Normocytic anemia 3.  Hyperbilirubinemia and transaminitis 4.  Hypercalcemia 5.  Pathologic fracture at T11 and L1  -I discussed the work-up to date including imaging findings and lab results.  We discussed that findings on imaging are concerning for metastatic malignancy.  Imaging suggestive of primary ascending colon carcinoma.  -Recommend proceeding with liver biopsy to confirm the diagnosis. -We discussed that if biopsy confirms metastatic malignancy, no therapy would be curative and treatment would be palliative in nature.  We had some initial discussions regarding systemic chemotherapy and radiation.  The patient has expressed that he would not consider chemotherapy but will consider radiation if it would help control his pain.  Recommend radiation  oncology evaluation for consideration of palliative radiation.  We will have further discussions with the patient regarding systemic treatment options once biopsy results are available. -The patient is anemic with a low percent saturation iron level.  Recommend IV iron for replacement. -He has abnormal liver function likely related to his  metastatic disease in the liver.  Monitor. -He is receiving IV hydration for his hypercalcemia.  Monitor calcium level closely.  May need to consider zoledronic acid due to metastatic bone lesions.  Thank you for this referral.   Mikey Bussing, DNP, AGPCNP-BC, AOCNP   ADDENDUM: I saw and examined Raymond Henderson.  He is very nice.  He clearly has extensive malignancy.  We do not have a tissue diagnosis yet.  However he does have a mass in the ascending colon.  He clearly needs a biopsy of this mass.  He also may need a marrow biopsy of what is in the liver.  Again I suspect this is metastatic colon cancer.  However, it would be unusual for this to metastasized to the bones and not go to the liver.  A CEA level will certainly help.  His PSA was normal.  He does have some elevated LFTs.  He is somewhat iron deficient.  His iron saturation is only 15%.  He has not decided as whether or not he would take any systemic therapy.  Again, the biopsy will help with this.  He definitely needs radiation therapy regardless of whether he takes systemic chemo or not.  I would get Radiation Oncology involved with him.  I will give him some Zometa.  I will know if he would be a candidate for any type of vertebroplasty.  You can get Interventional Radiology to see him to see if they feel that he would be a candidate for vertebroplasty to help with pain.  Again, he wants to await the biopsy results before he makes any decision as to therapy.  I respect his philosophy.  He wants quality of life.  We will follow along.  There really is not much that we can do for him ourselves until we get the biopsy and see what it shows and then he decides as to whether or not he wants any kind of systemic therapy.  I appreciate the wonderful care that he is getting from all the staff in the ER.  Hopefully he will be able to move up to one of the floors.   Lattie Haw, MD  Hebrews 12:12

## 2021-06-18 NOTE — Assessment & Plan Note (Addendum)
Likely due to malignancy.  Seems to have improved today. -Resolved with IV fluid.

## 2021-06-18 NOTE — Evaluation (Signed)
Physical Therapy Evaluation Patient Details Name: Raymond Henderson MRN: [id number] DOB: Jul 05, 1950 Today's Date: 06/18/2021  History of Present Illness  Pt is a 71 y/o male admitted secondary to worsening back pain. 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 Found to have liver mets and suspected primary lesion in colon. Workup pending. No pertinent PMH.  Clinical Impression  Pt admitted secondary to problem above with deficits below.  Pt with increased back pain with very poor tolerance for mobility. Was able to roll with min A this session. Attempted to sit at EOB X4, however, pt unable to tolerate. Anticipate if pt pain improves, he will progress well, but will need to progress mobility further to determine most appropriate d/c recommendations. Will continue to follow acutely.      Recommendations for follow up therapy are one component of a multi-disciplinary discharge planning process, led by the attending physician.  Recommendations may be updated based on patient status, additional functional criteria and insurance authorization.  Follow Up Recommendations Other (comment) (TBD)    Assistance Recommended at Discharge Frequent or constant Supervision/Assistance  Patient can return home with the following  A little help with walking and/or transfers;A little help with bathing/dressing/bathroom;Assistance with cooking/housework;Help with stairs or ramp for entrance;Assist for transportation    Equipment Recommendations Other (comment) (TBD)  Recommendations for Other Services       Functional Status Assessment Patient has had a recent decline in their functional status and demonstrates the ability to make significant improvements in function in a reasonable and predictable amount of time.     Precautions / Restrictions Precautions Precautions: Back Precaution Booklet Issued: No Precaution Comments: Verbally reviewed back precautions in  ED Restrictions Weight Bearing Restrictions: No      Mobility  Bed Mobility Overal bed mobility: Needs Assistance Bed Mobility: Rolling Rolling: Min assist         General bed mobility comments: Min A to roll from side to side to pull up lower body dressing. Attempted to sit up at EOB X4, however, pt unable to tolerate secondary to pain.    Transfers                        Ambulation/Gait                  Stairs            Wheelchair Mobility    Modified Rankin (Stroke Patients Only)       Balance                                             Pertinent Vitals/Pain Pain Assessment Pain Assessment: 0-10 Pain Score: 4  Pain Location: R side Pain Descriptors / Indicators: Grimacing, Guarding Pain Intervention(s): Limited activity within patient's tolerance, Monitored during session, Repositioned    Home Living Family/patient expects to be discharged to:: Private residence Living Arrangements: Alone Available Help at Discharge: Friend(s) [place] Sender) Type of Home: Apartment Home Access: Level entry       Home Layout: One level Home Equipment: Grab bars - tub/shower      Prior Function Prior Level of Function : Independent/Modified Independent;Driving                     Hand Dominance  Extremity/Trunk Assessment   Upper Extremity Assessment Upper Extremity Assessment: Defer to OT evaluation    Lower Extremity Assessment Lower Extremity Assessment: Generalized weakness    Cervical / Trunk Assessment Cervical / Trunk Assessment: Other exceptions Cervical / Trunk Exceptions: metastatic lesions to spine  Communication   Communication: No difficulties  Cognition Arousal/Alertness: Awake/alert Behavior During Therapy: WFL for tasks assessed/performed Overall Cognitive Status: No family/caregiver present to determine baseline cognitive functioning                                  General Comments: Slow processing        General Comments      Exercises     Assessment/Plan    PT Assessment Patient needs continued PT services  PT Problem List Decreased strength;Decreased activity tolerance;Decreased balance;Decreased mobility;Decreased knowledge of use of DME;Decreased knowledge of precautions;Pain       PT Treatment Interventions DME instruction;Therapeutic activities;Functional mobility training;Therapeutic exercise;Balance training;Patient/family education;Gait training    PT Goals (Current goals can be found in the Care Plan section)  Acute Rehab PT Goals Patient Stated Goal: to decrease pain PT Goal Formulation: With patient Time For Goal Achievement: 07/02/21 Potential to Achieve Goals: Good    Frequency Min 3X/week     Co-evaluation               AM-PAC PT "6 Clicks" Mobility  Outcome Measure Help needed turning from your back to your side while in a flat bed without using bedrails?: A Little Help needed moving from lying on your back to sitting on the side of a flat bed without using bedrails?: A Little Help needed moving to and from a bed to a chair (including a wheelchair)?: A Lot Help needed standing up from a chair using your arms (e.g., wheelchair or bedside chair)?: A Lot Help needed to walk in hospital room?: A Lot Help needed climbing 3-5 steps with a railing? : Total 6 Click Score: 13    End of Session Equipment Utilized During Treatment: Gait belt Activity Tolerance: Patient limited by pain Patient left: in bed;with call bell/phone within reach (on stretcher in ED) Nurse Communication: Mobility status PT Visit Diagnosis: Unsteadiness on feet (R26.81);Muscle weakness (generalized) (M62.81);Difficulty in walking, not elsewhere classified (R26.2);Pain Pain - part of body:  (back)    Time: 8676-7209 PT Time Calculation (min) (ACUTE ONLY): 12 min   Charges:   PT Evaluation $PT Eval Moderate Complexity: 1 Mod           Reuel Derby, PT, DPT  Acute Rehabilitation Services  Office: [phone]   Rudean Hitt 06/18/2021, 3:45 PM

## 2021-06-18 NOTE — Assessment & Plan Note (Addendum)
S/p image guided biopsy and kyphoplasty on 6/7.  Pathology as above. Simulation and palliative radiation 6/8>> Pain control as above

## 2021-06-18 NOTE — Consult Note (Signed)
Consultation Note Date: 06/18/2021   Patient Name: Raymond Henderson  DOB: 1950-12-19  MRN: [id number]  Age / Sex: 71 y.o., male  PCP: Kathalene Frames, MD Referring Physician: Albertine Patricia, MD  Reason for Consultation: Establishing goals of care  HPI/Patient Profile: 71 y.o. male  with no significant past medical history admitted on 06/17/2021 with severe midthoracic back pain for several weeks.   Patient reports that he last had a colonoscopy approximately 15 years ago in [place] and has not seen a PCP in approximately that many years. Upon arrival to ED, initial work-up identified widespread osseous metastatic disease seen in the lumbar spine sacrum and pelvis, an associated pathologic L1 fracture, liver metastases, and a mass in the ascending colon, likely the primary malignancy.  PMT has been consulted to assist with goals of care conversation.  Clinical Assessment and Goals of Care:  I have reviewed medical records including EPIC notes, labs and imaging, received report from RN, assessed the patient and then met at the bedside with patient's wife Horris Latino to discuss diagnosis prognosis, [place], EOL wishes, disposition and options.  I introduced Palliative Medicine as specialized medical care for people living with serious illness. It focuses on providing relief from the symptoms and stress of a serious illness. The goal is to improve quality of life for both the patient and the family.  We discussed a brief life review of the patient and then focused on their current illness.  The natural disease trajectory and expectations at EOL were discussed.  I attempted to elicit values and goals of care important to the patient.    Medical History Review and Understanding:  We discussed patient's metastatic cancer and he understands the severity of his illness, that his time is likely limited no matter what  interventions are tried.  Social History: Patient has been divorced 3 times, with a son and daughter from a previous marriage that he is not very close to.  He has a twin sister in [place] and an older brother in [place].  His best friend has been his greatest support for the last 8 years.  He lives in an apartment alone with his dog.  He works for [employer] in [employer].  He is not very spiritual.  Functional and Nutritional State: Patient has had a very poor appetite since his back pain began on May 1.  For the past 2 weeks, he has been unable to ambulate due to severe pain.  Palliative Symptoms: Pain  Advance Directives: A detailed discussion regarding advanced directives was had.  No HCPOA on file.   Code Status: Concepts specific to code status, artifical feeding and hydration, and rehospitalization were considered and discussed.   Discussion: Patient and his friend Horris Latino are understandably shocked by recent news of his metastatic cancer and still processing this news.  [name] states "it is what it is " and confirms that he would like to obtain more information from work-up inclusive of endoscopy for biopsy and recommendations from oncology.  He becomes emotional as he states there will likely not be any options that will prolong his life and that he will likely choose to focus on his quality of life/pursue hospice.  He has been suffering immensely over the past 5 weeks due to his back pain.  He knows that he would never want chemo or surgery.  However, he is still interested in getting more precise information to determine his prognosis.  With patient's permission, we discussed that unfortunately prognosis  with metastatic cancer is quite poor and he would be eligible for hospice at this time even without further work-up if he is ready.  Horris Latino notes her concern that he would not be able to go home with limited hospice support.  We discussed options including SNF with hospice and  residential hospice should he decline further over the course of his work-up.  His goal is to be pain-free and have good care.  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  Currently, his pain is a 7 out of 10 with as needed IV morphine.  We discussed that pain may not be entirely resolved, but can be improved further for better functioning including sitting up comfortably.   The difference between aggressive medical intervention and comfort care was considered in light of the patient's goals of care. Hospice and Palliative Care services outpatient were explained and offered.   Discussed the importance of continued conversation with family and the medical providers regarding overall plan of care and treatment options, ensuring decisions are within the context of the patient's values and GOCs.   Questions and concerns were addressed.  Hard Choices booklet left for review. The family was encouraged to call with questions or concerns.  PMT will continue to support holistically.     SUMMARY OF RECOMMENDATIONS   -DNR -Continue with work-up and diagnostics recommended, patient wishes to hear from oncology before making a final decision -He is leaning towards focusing on quality of life and hospice -Patient would likely need placement as he can no longer care for himself at home even with hospice support -Spiritual care consult declined -Psychosocial and emotional support provided -PMT will continue to follow  Prognosis:  < 6 months  Discharge Planning: To Be Determined      Primary Diagnoses: Present on Admission:  Metastatic cancer [facility])  Pathologic fracture of lumbar vertebra, initial encounter  Pathologic fracture of thoracic vertebrae, initial encounter  Hypercalcemia  Normocytic anemia    Physical Exam Vitals and nursing note reviewed.  Constitutional:      General: He is not in acute distress.     Appearance: He is ill-appearing.  Cardiovascular:     Rate and Rhythm: Tachycardia present.  Pulmonary:     Effort: Tachypnea present. No respiratory distress.  Skin:    General: Skin is warm and dry.  Neurological:     Mental Status: He is alert and oriented to person, place, and time.  Psychiatric:        Mood and Affect: Affect is tearful.        Thought Content: Thought content normal.        Cognition and Memory: Cognition normal.    Vital Signs: BP (!) 141/88   Pulse (!) 104   Temp 98.3 F (36.8 C) (Oral)   Resp (!) 22   Ht _0  (1.753 m)   Wt 83.5 kg   SpO2 95%   BMI 27.17 kg/m  Pain Scale: 0-10   Pain Score: 5    SpO2: SpO2: 95 % O2 Device:SpO2: 95 % O2 Flow Rate: .    Palliative Assessment/Data: 30%     MDM: High   Jamy Cleckler Johnnette Litter, PA-C  Palliative Medicine Team Team phone # [phone]  Thank you for allowing the Palliative Medicine Team to assist in the care of this patient. Please utilize secure chat with additional questions, if there is no response within 30 minutes please call the  above phone number.  Palliative Medicine Team providers are available by phone from 7am to 7pm daily and can be reached through the team cell phone.  Should this patient require assistance outside of these hours, please call the patient's attending physician.

## 2021-06-18 NOTE — Consult Note (Signed)
Chief Complaint: Patient was seen in consultation today for  Chief Complaint  Patient presents with   Fall   Back Pain   at the request of Dr. Waldron Labs  Supervising Physician: Arne Cleveland  Patient Status: [facility] - ED  History of Present Illness: Raymond Henderson is a 71 y.o. male with no past medical history who presents to [facility] emergency department with complaints of back pain.   Patient explains that approximately 5 weeks ago he began to experience back pain.  Back pain is mainly located in the midline thoracic region.  Pain was initially mild in intensity but rapidly became more more severe.  Pain is sharp in quality, severe in intensity and radiates distally.  Pain is worse with movement and particularly with weightbearing.  Patient denies any associated weakness of the extremities.  Patient denies any fever, dysuria nausea or vomiting.     Patient additionally reports an unintentional 15 pound weight loss over the same span of time as well as intermittent night sweats.  Patient denies blood in the stool.    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  Past Medical History:  Diagnosis Date   No pertinent past medical history     History reviewed. No pertinent surgical history.  Allergies: Penicillins  Medications: Prior to Admission medications   Medication Sig Start Date End Date Taking? Authorizing Provider  acetaminophen (TYLENOL) 650 MG CR tablet Take 1,300 mg by mouth every 8 (eight) hours.   Yes [provider]  benzonatate (TESSALON) 200 MG capsule Take 200 mg by mouth 3 (three) times daily as needed for cough. 06/05/21  Yes [provider]  cyclobenzaprine (FLEXERIL) 5 MG tablet Take 5 mg by mouth 3 (three) times daily as needed for muscle spasms.  06/15/21  Yes [provider]  gabapentin (NEURONTIN) 300 MG capsule Take 300 mg by mouth every 8 (eight) hours. 06/15/21  Yes [provider]  metroNIDAZOLE (FLAGYL) 500 MG tablet Take 500 mg by mouth 3 (three) times daily. Patient not taking: Reported on 06/18/2021 06/15/21   [provider]     Family History  Problem Relation Age of Onset   Heart disease Neg Hx     Social History   Socioeconomic History   Marital status: Married    Spouse name: Not on file   Number of children: Not on file   Years of education: Not on file   Highest education level: Not on file  Occupational History   Not on file  Tobacco Use   Smoking status: Never   Smokeless tobacco: Never  Substance and Sexual Activity   Alcohol use: Never   Drug use: Never   Sexual activity: Not on file  Other Topics Concern   Not on file  Social History Narrative   Not on file   Social Determinants of Health   Financial Resource Strain: Not on file  Food Insecurity: Not on file  Transportation Needs: Not on file  Physical Activity: Not on file  Stress: Not on file  Social Connections: Not on file    Review of Systems: A 12 point ROS discussed and pertinent positives are indicated in the HPI above.  All other systems are negative.  Review of Systems  Constitutional:  Positive for activity change and unexpected weight change.  HENT: Negative.  Eyes: Negative.   Respiratory: Negative.    Cardiovascular: Negative.   Gastrointestinal:  Positive for constipation.  Endocrine: Negative.   Genitourinary: Negative.   Neurological:  Positive for weakness.  Hematological: Negative.   Psychiatric/Behavioral: Negative.     Vital Signs: BP (!) 141/88   Pulse (!) 104   Temp 98.3 F (36.8 C) (Oral)   Resp (!) 22   Ht '5\' 9"'$  (1.753 m)   Wt 184 lb (83.5 kg)   SpO2 95%   BMI 27.17 kg/m   Physical Exam Constitutional:      General: He is not in acute distress.    Appearance: He is  ill-appearing.  HENT:     Head: Normocephalic and atraumatic.     Mouth/Throat:     Mouth: Mucous membranes are moist.     Pharynx: Oropharynx is clear.  Eyes:     Extraocular Movements: Extraocular movements intact.  Cardiovascular:     Rate and Rhythm: Tachycardia present.     Pulses: Normal pulses.  Pulmonary:     Effort: Pulmonary effort is normal.     Breath sounds: Normal breath sounds.  Abdominal:     Palpations: Abdomen is soft.     Tenderness: There is abdominal tenderness.  Musculoskeletal:        General: No swelling.  Skin:    General: Skin is dry.     Coloration: Skin is pale.  Neurological:     General: No focal deficit present.     Mental Status: He is alert.  Psychiatric:        Mood and Affect: Mood normal.        Behavior: Behavior normal.    Imaging: MR THORACIC SPINE W WO CONTRAST  Result Date: 06/18/2021 CLINICAL DATA:  Initial evaluation for acute back pain, trauma. EXAM: MRI THORACIC WITHOUT AND WITH CONTRAST TECHNIQUE: Multiplanar and multiecho pulse sequences of the thoracic spine were obtained without and with intravenous contrast. CONTRAST:  8.66m GADAVIST GADOBUTROL 1 MMOL/ML IV SOLN COMPARISON:  None Available. FINDINGS: Alignment: Physiologic with preservation of the normal thoracic kyphosis. Vertebrae: There are innumerable T1 hypointense, stir hyperintense enhancing lesions seen throughout the thoracic spine, consistent with widespread osseous metastatic disease. There is involvement of essentially all levels. Posterior elements are involved at numerous levels as well. Associated pathologic fracture at T11 with up to 35% height loss. Evidence for early epidural extension into the ventral epidural space at T7, T9, and T11. Prominent extra osseous extension with soft tissue component seen about the left rib and transverse process of T7, measuring approximately 3.3 x 2.4 cm (series 20, image 20). Multiple lesion seen involving the partially visualized  posterior ribs as well. No significant or high-grade spinal stenosis at this time. Cord: Early epidural extension at the levels of T7, T9, and T11. No high-grade spinal stenosis or frank cord impingement. Cord itself is normal in caliber in appearance. No abnormal intramedullary enhancement. Paraspinal and other soft tissues: Extraosseous extension with associated soft tissue component about the left transverse process of T7 as above. More mild extraosseous extension also noted on the right at T4 and on the left at T9. Innumerable lesions seen throughout the partially visualized liver, likely reflecting metastatic disease. Disc levels: No significant underlying disc pathology. No high-grade spinal stenosis. Mild foraminal narrowing on the right at T6-7 and T11-12 related to extraosseous extension of tumor. Foramina otherwise remain patent at this time. IMPRESSION: 1. Widespread osseous metastatic disease throughout the thoracic spine as above. 2. Associated  pathologic fracture at T11 with up to 35% height loss. 3. Evidence for early epidural extension at the levels of T7, T9, and T11. No high-grade spinal stenosis or frank cord impingement at this time. 4. Innumerable lesions throughout the partially visualized liver, likely reflecting metastatic disease. Electronically Signed   By: Jeannine Boga M.D.   On: 06/18/2021 01:07   MR Lumbar Spine W Wo Contrast  Result Date: 06/18/2021 CLINICAL DATA:  Initial evaluation for trauma, severe back pain. EXAM: MRI LUMBAR SPINE WITHOUT AND WITH CONTRAST TECHNIQUE: Multiplanar and multiecho pulse sequences of the lumbar spine were obtained without and with intravenous contrast. CONTRAST:  8.29m GADAVIST GADOBUTROL 1 MMOL/ML IV SOLN COMPARISON:  None Available. FINDINGS: Segmentation: Standard. Lowest well-formed disc space labeled the L5-S1 level. Alignment: Physiologic with preservation of the normal lumbar lordosis. No listhesis. Vertebrae: Widespread osseous  metastatic disease seen throughout the lumbar spine as well as the visualized sacrum and pelvis. There is involvement of essentially all levels. Scattered posterior element involvement seen at numerable levels as well. Associated pathologic fracture at L1 with mild 20% height loss. Evidence for early epidural extension of tumor into the ventral epidural space at L1 and L5 (series 27, images 9, 7). Conus medullaris and cauda equina: Conus extends to the L1 level. Conus and cauda equina appear normal. No abnormal intramedullary enhancement. Paraspinal and other soft tissues: Mild extraosseous extension of tumor with involvement of the adjacent paraspinous soft tissues noted at multiple levels, most notable at the right transverse process of L3 (series 32, image 19). Scattered edema within the lower posterior paraspinous musculature likely reactive. Enlarged aortocaval lymph nodes measuring up to 1.5 cm in short axis, concerning for nodal metastatic disease (series 29, images 5, 10). Disc levels: T12-L1: Pathologic compression fracture of L1 with mild epidural extension of tumor. Secondary flattening of the ventral thecal sac, eccentric to the left. No significant spinal stenosis at this time. Foramina remain patent. L1-2:  Unremarkable. L2-3: Small left extraforaminal disc protrusion with annular fissure. Mild facet hypertrophy. No stenosis. L3-4: Disc desiccation with mild disc bulge. Mild facet hypertrophy. No stenosis. L4-5: Disc desiccation with mild disc bulge. Mild to moderate facet hypertrophy. No significant stenosis. L5-S1: Degenerative intervertebral disc space narrowing with diffuse disc bulge and reactive endplate change. Epidural extension of tumor into the right ventral epidural space (series 29, image 33). Mild to moderate bilateral facet hypertrophy. Mild narrowing of the right lateral recess without significant spinal stenosis. Mild left L5 foraminal narrowing. Right neural foramen remains patent.  IMPRESSION: 1. Widespread osseous metastatic disease throughout the lumbar spine as well as the visualized sacrum and pelvis. 2. Associated pathologic fracture of L1 with mild 20% height loss. Evidence for early epidural extension of tumor into the ventral epidural space at L1 and L5. No significant spinal stenosis at this time. 3. Enlarged aortocaval lymph nodes, concerning for nodal metastatic disease. 4. Underlying mild multilevel degenerative spondylosis as above, most pronounced at L5-S1. Electronically Signed   By: BJeannine BogaM.D.   On: 06/18/2021 01:16   CT CHEST ABDOMEN PELVIS W CONTRAST  Result Date: 06/18/2021 CLINICAL DATA:  Metastatic disease.  Staging. EXAM: CT CHEST, ABDOMEN, AND PELVIS WITH CONTRAST TECHNIQUE: Multidetector CT imaging of the chest, abdomen and pelvis was performed following the standard protocol during bolus administration of intravenous contrast. RADIATION DOSE REDUCTION: This exam was performed according to the departmental dose-optimization program which includes automated exposure control, adjustment of the mA and/or kV according to patient size and/or use  of iterative reconstruction technique. CONTRAST:  139m OMNIPAQUE IOHEXOL 300 MG/ML  SOLN COMPARISON:  MRI thoracic and lumbar spine 06/18/2021 FINDINGS: CT CHEST FINDINGS Cardiovascular: The heart size appears within normal limits. No pericardial effusion identified. Aortic atherosclerosis and coronary artery calcifications. Mediastinum/Nodes: Thyroid gland, trachea and esophagus are unremarkable. Subcarinal lymph node measures 1.5 cm, image 23/3. No enlarged axillary, supraclavicular, or hilar lymph nodes. Lungs/Pleura: No pleural effusion. Mild changes of emphysema. No suspicious pulmonary nodule or mass. Musculoskeletal: Multifocal lytic bone metastases identified involving the ribs, sternum and spine. Index lesions include: -expansile lesion with associated pathologic fracture involves the anterolateral aspect  of the right second rib measuring 2.1 cm, image 19/5. -lesion involving the sternal manubrium measures 0.9 cm, 26/5. -lesion involving the T11 vertebra measures 3.6 cm and extends into the right posterior elements, image 125/5. Associated pathologic compression fracture is noted with loss of approximately 25% of the vertebral body height. (See thoracic spine MRI for further details regarding the thoracic spine metastasis.) CT ABDOMEN PELVIS FINDINGS Hepatobiliary: Diffuse liver metastases are identified throughout both lobes of the liver. Lesions are too numerous to count. Index lesions include: -segment 2 lesion measuring 2.4 x 2.0 cm, image 44/3. -partially exophytic lesion involving segment 6 measures 2.1 x 1.7 cm, image 62/3. -segment 4B lesion measures 2.1 x 1.4 cm, image 5/3. The gallbladder appears surgically absent. Pancreas: Unremarkable. No pancreatic ductal dilatation or surrounding inflammatory changes. Spleen: Normal in size without focal abnormality. Adrenals/Urinary Tract: Adrenal glands are unremarkable. Kidneys are normal, without renal calculi, focal lesion, or hydronephrosis. Bladder is unremarkable. Stomach/Bowel: Stomach appears normal. No signs of small bowel wall thickening, inflammation, or distension. Circumferential mass is identified involving the mid to distal ascending colon measures 4.4 x 5.2 by 8.9 cm, image 67/3. There is significant luminal narrowing involving the affected segment without signs of bowel obstruction. Transmural extension of tumor is identified with increased pericolonic soft tissue infiltration. Vascular/Lymphatic: Aortic atherosclerosis without aneurysm. Upper abdominal adenopathy is identified: -portacaval lymph node measures 2.3 cm short axis, image 53/3. -porta hepatic lymph node measures 2.3 cm, image 593/7 -ileocolic mesenteric lymph node measures 2.9 cm, image 70/3. No pelvic or inguinal adenopathy. Reproductive: Mild prostate gland enlargement. Other: No  ascites. Peritoneal nodule within the left hemiabdomen is identified measuring 8 mm, image 70/3. Musculoskeletal: Multifocal lytic bone metastases identified involving the lumbar spine and bony pelvis. Index lesions include: -L2 vertebral body lesion measures 2.7 cm, image 72/7. -left inferior pubic rami lesion measures 1.5 cm, image 130/3. -right acetabular lesion measures 0.9 cm, image 111/3. Bilateral proximal femur lesions are identified: -Lesion involving the lesser trochanter of the proximal left femur measures 1.7 cm, image 127/3. -intertrochanteric lesion involving the proximal right femur measures 2.3 cm, image 126/3. IMPRESSION: 1. 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 Transmural extension of tumor is identified with increased pericolonic soft tissue infiltration. There is significant luminal narrowing involving the affected segment of colon without signs of high-grade bowel obstruction. 2. Extensive liver metastases. 3. Upper abdominal and mesenteric adenopathy compatible with metastatic adenopathy. Enlarged subcarinal lymph node compatible with metastatic disease. Please see report from MRI of the thoracic and lumbar spine, also performed today. 4. There are lytic lesions involving bilateral proximal femurs which may be of orthopedic significance. 5. Coronary artery calcifications noted. 6. Aortic Atherosclerosis (ICD10-I70.0) and Emphysema (ICD10-J43.9). Electronically Signed   By: TKerby MoorsM.D.   On: 06/18/2021 05:36   DG Chest Portable 1 View  Result Date: 06/17/2021 CLINICAL DATA:  Cough with leukocytosis. EXAM: PORTABLE CHEST 1 VIEW COMPARISON:  None Available. FINDINGS: Multiple overlying radiopaque cardiac lead wires are noted. The heart size and mediastinal contours are within normal limits. Both lungs are clear. An ill-defined lytic area is seen involving the lateral aspect of the second left rib. IMPRESSION: 1. No acute infiltrate or pleural effusion. 2.  Ill-defined lytic area involving the lateral aspect of the second left rib. Further evaluation with nonemergent chest CT is recommended. Electronically Signed   By: Virgina Norfolk M.D.   On: 06/17/2021 22:41    Labs:  CBC: Recent Labs    06/17/21 2138 06/18/21 0810  WBC 17.0* 14.0*  HGB 11.4* 11.4*  HCT 36.4* 36.1*  PLT 346 326    COAGS: Recent Labs    06/18/21 0509  INR 1.1  APTT 33    BMP: Recent Labs    06/17/21 2138 06/18/21 0810  NA 132* 135  K 4.0 4.1  CL 96* 98  CO2 25 27  GLUCOSE 110* 106*  BUN 24* 19  CALCIUM 10.5* 10.8*  CREATININE 1.01 0.87  GFRNONAA >60 >60    LIVER FUNCTION TESTS: Recent Labs    06/18/21 0509 06/18/21 0810  BILITOT 2.0* 1.8*  AST 73* 68*  ALT 46* 46*  ALKPHOS 725* 759*  PROT 6.7 6.5  ALBUMIN 2.5* 2.4*    TUMOR MARKERS: No results for input(s): AFPTM, CEA, CA199, CHROMGRNA in the last 8760 hours.  Assessment and Plan:  Metastatic disease --liver with multiple lesions --for liver biopsy today --patient is not on blood thinners, NPO since 9am  Risks and benefits of liver biopsy was discussed with the patient and/or patient's family including, but not limited to bleeding, infection, damage to adjacent structures or low yield requiring additional tests.  All of the questions were answered and there is agreement to proceed.  Consent signed and in chart.    Thank you for this interesting consult.  I greatly enjoyed meeting Raymond Henderson and look forward to participating in their care.  A copy of this report was sent to the requesting provider on this date.  Electronically Signed: Pasty Spillers, PA 06/18/2021, 1:13 PM   I spent a total of 40 Minutes in face to face in clinical consultation, greater than 50% of which was counseling/coordinating care for liver biopsy

## 2021-06-19 ENCOUNTER — Ambulatory Visit
Admit: 2021-06-19 | Discharge: 2021-06-19 | Disposition: A | Discharge status: Home / Self Care | Payer: Medicare Other | Attending: Radiation Oncology | Admitting: Radiation Oncology

## 2021-06-19 ENCOUNTER — Inpatient Hospital Stay (HOSPITAL_COMMUNITY): Payer: Medicare Other

## 2021-06-19 DIAGNOSIS — C799 Secondary malignant neoplasm of unspecified site: Secondary | ICD-10-CM

## 2021-06-19 DIAGNOSIS — Z7189 Other specified counseling: Secondary | ICD-10-CM | POA: Diagnosis not present

## 2021-06-19 DIAGNOSIS — C7951 Secondary malignant neoplasm of bone: Secondary | ICD-10-CM | POA: Diagnosis not present

## 2021-06-19 DIAGNOSIS — M8450XA Pathological fracture in neoplastic disease, unspecified site, initial encounter for fracture: Secondary | ICD-10-CM | POA: Diagnosis not present

## 2021-06-19 LAB — COMPREHENSIVE METABOLIC PANEL
ALT: 40 U/L (ref 0–44)
AST: 63 U/L — ABNORMAL HIGH (ref 15–41)
Albumin: 2.5 g/dL — ABNORMAL LOW (ref 3.5–5.0)
Alkaline Phosphatase: 792 U/L — ABNORMAL HIGH (ref 38–126)
Anion gap: 8 (ref 5–15)
BUN: 20 mg/dL (ref 8–23)
CO2: 27 mmol/L (ref 22–32)
Calcium: 11.7 mg/dL — ABNORMAL HIGH (ref 8.9–10.3)
Chloride: 99 mmol/L (ref 98–111)
Creatinine, Ser: 0.95 mg/dL (ref 0.61–1.24)
GFR, Estimated: >60 mL/min (ref >60)
Glucose, Bld: 123 mg/dL — ABNORMAL HIGH (ref 70–99)
Potassium: 4.5 mmol/L (ref 3.5–5.1)
Sodium: 134 mmol/L — ABNORMAL LOW (ref 135–145)
Total Bilirubin: 2.1 mg/dL — ABNORMAL HIGH (ref 0.3–1.2)
Total Protein: 6.6 g/dL (ref 6.5–8.1)

## 2021-06-19 LAB — CBC WITH DIFFERENTIAL/PLATELET
Abs Immature Granulocytes: 0.23 10*3/uL — ABNORMAL HIGH (ref 0.00–0.07)
Basophils Absolute: 0 10*3/uL (ref 0.0–0.1)
Basophils Relative: 0 %
Eosinophils Absolute: 0.1 10*3/uL (ref 0.0–0.5)
Eosinophils Relative: 1 %
HCT: 37.7 % — ABNORMAL LOW (ref 39.0–52.0)
Hemoglobin: 12.4 g/dL — ABNORMAL LOW (ref 13.0–17.0)
Immature Granulocytes: 2 %
Lymphocytes Relative: 7 %
Lymphs Abs: 1.1 10*3/uL (ref 0.7–4.0)
MCH: 26.8 pg (ref 26.0–34.0)
MCHC: 32.9 g/dL (ref 30.0–36.0)
MCV: 81.6 fL (ref 80.0–100.0)
Monocytes Absolute: 1.5 10*3/uL — ABNORMAL HIGH (ref 0.1–1.0)
Monocytes Relative: 10 %
Neutro Abs: 12.1 10*3/uL — ABNORMAL HIGH (ref 1.7–7.7)
Neutrophils Relative %: 80 %
Platelets: 239 10*3/uL (ref 150–400)
RBC: 4.62 MIL/uL (ref 4.22–5.81)
RDW: 18.6 % — ABNORMAL HIGH (ref 11.5–15.5)
WBC: 15 10*3/uL — ABNORMAL HIGH (ref 4.0–10.5)
nRBC: 0 % (ref 0.0–0.2)

## 2021-06-19 LAB — MAGNESIUM: Magnesium: 2.3 mg/dL (ref 1.7–2.4)

## 2021-06-19 LAB — CEA: CEA: 5.8 ng/mL — ABNORMAL HIGH (ref 0.0–4.7)

## 2021-06-19 LAB — AFP TUMOR MARKER: AFP, Serum, Tumor Marker: 2.7 ng/mL (ref 0.0–8.4)

## 2021-06-19 IMAGING — US US BIOPSY CORE LIVER
1 series · 11 of 11 positions shown · non-contrast
Comparison: None Available.

INDICATION: [age] year old male with history of ascending colonic mass and
multifocal liver masses, presumed metastases.

EXAM:
ULTRASOUND GUIDED LIVER LESION BIOPSY

[Series 1: us biopsy (liver) · 11 acquisitions, 11 frames shown]
[im 1/11]
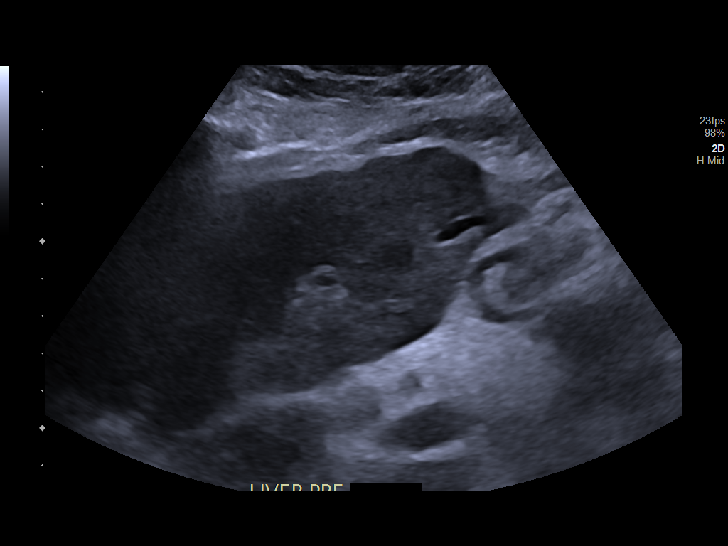
[im 2/11]
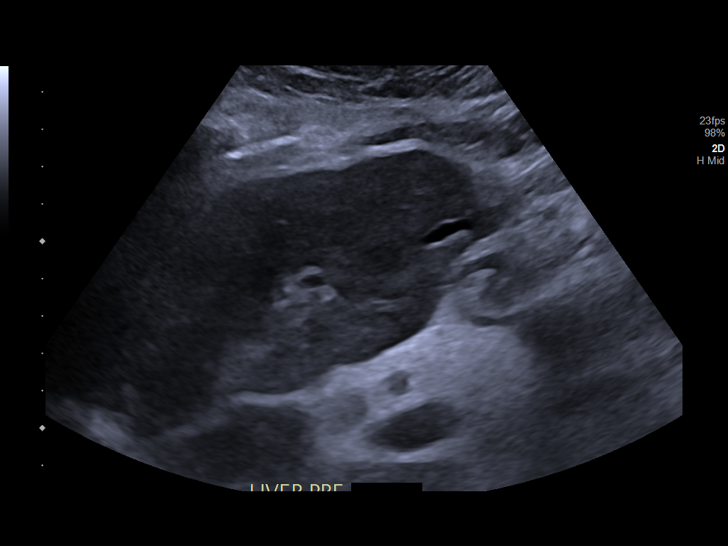
[im 3/11]
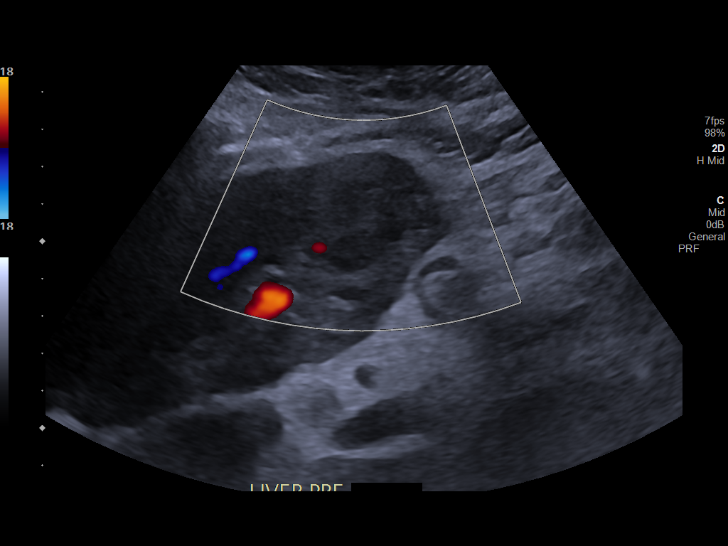
[im 4/11]
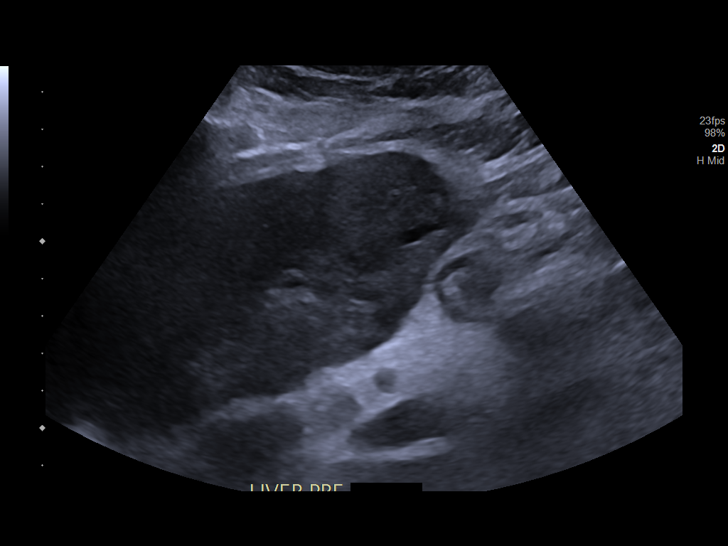
[im 5/11]
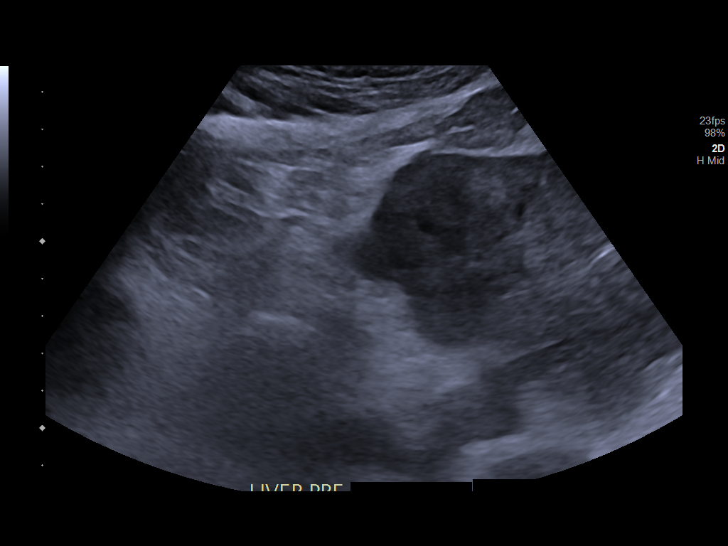
[im 6/11]
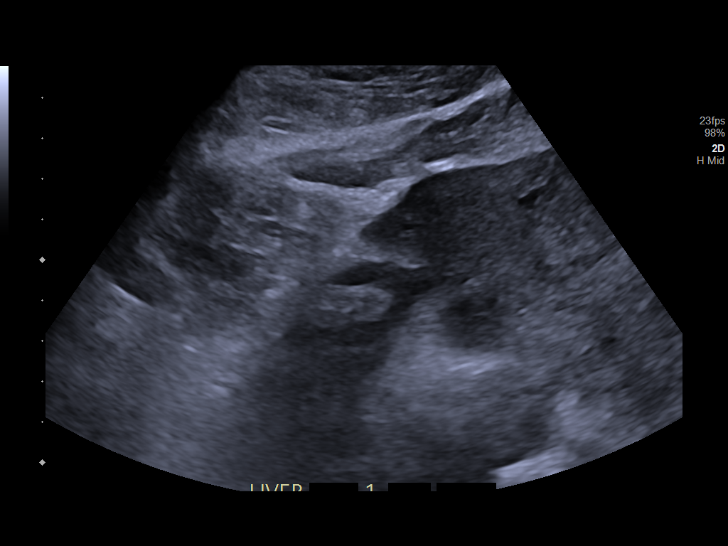
[im 7/11]
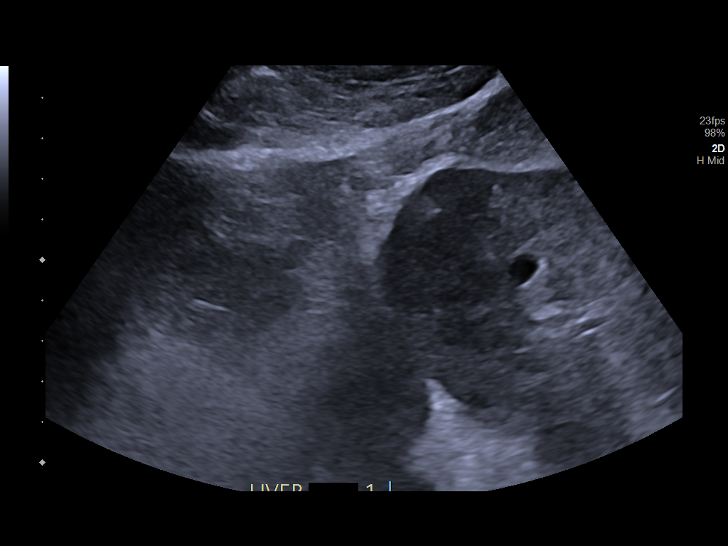
[im 8/11]
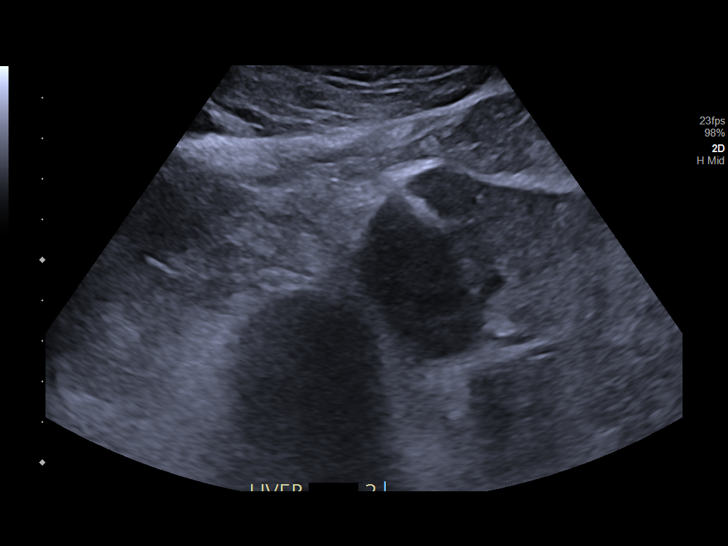
[im 9/11]
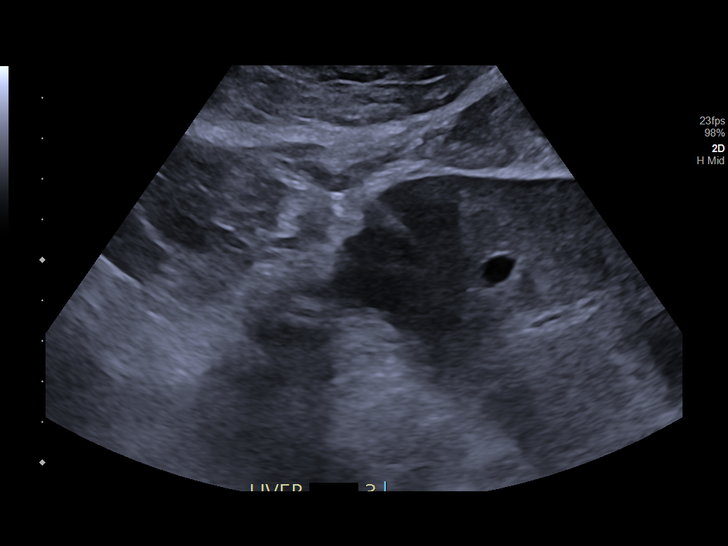
[im 10/11]
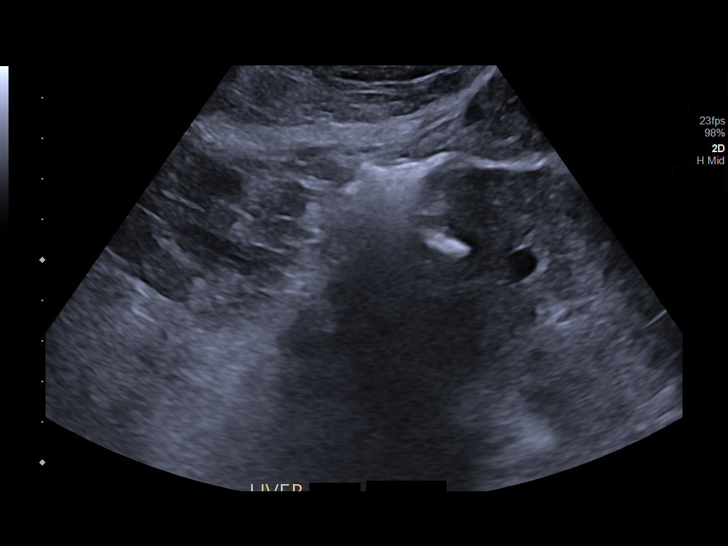
[im 11/11]
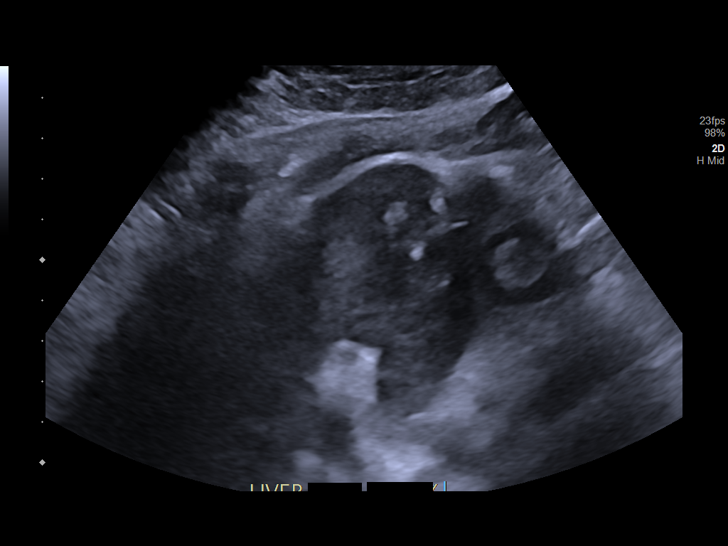

[11 of 11 positions shown; findings below may reference images not displayed]

MEDICATIONS:
None

ANESTHESIA/SEDATION:
Fentanyl 25 mcg IV; Versed 0.5 mg IV

Total Moderate Sedation time:  10 minutes.

The patient's level of consciousness and vital signs were monitored
continuously by radiology nursing throughout the procedure under my
direct supervision.

COMPLICATIONS:
None immediate.

PROCEDURE:
Informed written consent was obtained from the patient after a
discussion of the risks, benefits and alternatives to treatment. The
patient understands and consents the procedure. A timeout was
performed prior to the initiation of the procedure.

Ultrasound scanning was performed of the right upper abdominal
quadrant demonstrates hypoechoic mass in the left lobe of the liver
with anterior hepatic capsular bulging.

Left lobe liver mass was selected for biopsy and the procedure was
planned. The right upper abdominal quadrant was prepped and draped
in the usual sterile fashion. The overlying soft tissues were
anesthetized with 1% lidocaine with epinephrine. A 17 gauge, 6.8 cm
co-axial needle was advanced into a peripheral aspect of the lesion.
This was followed by 3 core biopsies with an 18 gauge core device
under direct ultrasound guidance.

The coaxial needle tract was embolized with a small amount of
Gel-Foam slurry and superficial hemostasis was obtained with manual
compression. Post procedural scanning was negative for definitive
area of hemorrhage or additional complication. A dressing was
placed. The patient tolerated the procedure well without immediate
post procedural complication.
IMPRESSION: Technically successful ultrasound guided core needle biopsy of left
lobe liver mass.

## 2021-06-19 MED ORDER — BISACODYL 10 MG RE SUPP
10.0000 mg | Freq: Two times a day (BID) | RECTAL | Status: DC
  Administered 2021-06-19 – 2021-06-25 (×5): 10 mg via RECTAL
  Filled 2021-06-19 (×12): qty 1

## 2021-06-19 MED ORDER — FENTANYL CITRATE (PF) 100 MCG/2ML IJ SOLN
INTRAMUSCULAR | Status: AC
  Filled 2021-06-19: qty 2

## 2021-06-19 MED ORDER — POLYETHYLENE GLYCOL 3350 17 G PO PACK
34.0000 g | PACK | Freq: Four times a day (QID) | ORAL | Status: AC
  Administered 2021-06-20: 34 g via ORAL
  Filled 2021-06-19: qty 2

## 2021-06-19 MED ORDER — LIDOCAINE HCL (PF) 1 % IJ SOLN
INTRAMUSCULAR | Status: AC
  Filled 2021-06-19: qty 30

## 2021-06-19 MED ORDER — SODIUM CHLORIDE 0.9 % IV SOLN: INTRAVENOUS | Status: DC

## 2021-06-19 MED ORDER — ENSURE ENLIVE PO LIQD
237.0000 mL | Freq: Three times a day (TID) | ORAL | Status: DC
  Administered 2021-06-19 – 2021-06-29 (×23): 237 mL via ORAL

## 2021-06-19 MED ORDER — POLYETHYLENE GLYCOL 3350 17 G PO PACK
34.0000 g | PACK | Freq: Four times a day (QID) | ORAL | Status: DC
  Filled 2021-06-19: qty 2

## 2021-06-19 MED ORDER — GELATIN ABSORBABLE 12-7 MM EX MISC
CUTANEOUS | Status: AC
  Filled 2021-06-19: qty 1

## 2021-06-19 MED ORDER — HEPARIN SODIUM (PORCINE) 5000 UNIT/ML IJ SOLN
5000.0000 [IU] | Freq: Three times a day (TID) | INTRAMUSCULAR | Status: DC
  Administered 2021-06-19: 5000 [IU] via SUBCUTANEOUS
  Filled 2021-06-19: qty 1

## 2021-06-19 MED ORDER — MIDAZOLAM HCL 2 MG/2ML IJ SOLN
INTRAMUSCULAR | Status: AC
  Filled 2021-06-19: qty 2

## 2021-06-19 MED ORDER — FENTANYL CITRATE (PF) 100 MCG/2ML IJ SOLN
INTRAMUSCULAR | Status: AC | PRN
  Administered 2021-06-19: 25 ug via INTRAVENOUS

## 2021-06-19 MED ORDER — MIDAZOLAM HCL 2 MG/2ML IJ SOLN
INTRAMUSCULAR | Status: AC | PRN
  Administered 2021-06-19: .5 mg via INTRAVENOUS

## 2021-06-19 NOTE — Progress Notes (Signed)
Daily Progress Note   Patient Name: Raymond Henderson       Date: 06/19/2021 DOB: 11-Feb-1950  Age: 71 y.o. MRN#: [id number] Attending Physician: Raymond Patricia, MD Primary Care Physician: Raymond Frames, MD Admit Date: 06/17/2021  Reason for Consultation/Follow-up: Establishing goals of care  Subjective: Medical records reviewed. Patient assessed at the bedside.  He reports 6 out of 10 pain, overall is in good spirits.  His friend Horris Latino is present.  Created space and opportunity for patient's thoughts and feelings on his current illness.  Reoriented to role of palliative care in discussing treatment options, ensuring decisions are made with patient's goals and values.  He tells me that so far, all updates and options reviewed today makes sense and he is satisfied with communication with his care team.    Explored whether he has discussed new cancer diagnosis with family.  He tells me that he is waiting until next week to update them , when work-up is expected to be completed, so that he has more information to share with family.  After our discussion of poor prognosis yesterday with review of current hospice eligibility with metastatic cancer, he still continues to feel he will have more concrete expectations of prognosis after more work-up is completed.  Offered to assist and support when discussing his cancer with family next week, as I will be available for family meeting if desired.  Patient verbalizes appreciation.  Questions and concerns addressed. PMT will continue to support holistically.   Length of Stay: 1   Physical Exam Vitals and nursing note reviewed.  Constitutional:      General: He is not in acute distress. Cardiovascular:     Rate and Rhythm: Tachycardia present.   Pulmonary:     Effort: Pulmonary effort is normal. No respiratory distress.  Skin:    General: Skin is warm and dry.  Neurological:     Mental Status: He is alert and oriented to person, place, and time.  Psychiatric:        Mood and Affect: Mood normal.            Vital Signs: BP 138/86 (BP Location: Left Arm)   Pulse (!) 109   Temp 98.9 F (37.2 C) (Oral)   Resp 18   Ht '5\' 9"'$  (1.753 m)   Wt  83.5 kg   SpO2 93%   BMI 27.17 kg/m  SpO2: SpO2: 93 % O2 Device: O2 Device: Room Air O2 Flow Rate: O2 Flow Rate (L/min): 2 L/min      Palliative Assessment/Data: 50%       Palliative Care Assessment & Plan   Patient Profile: 71 y.o. male  with no significant past medical history admitted on 06/17/2021 with severe midthoracic back pain for several weeks.    Patient reports that he last had a colonoscopy approximately 15 years ago in [place] and has not seen a PCP in approximately that many years. Upon arrival to ED, initial work-up identified widespread osseous metastatic disease seen in the lumbar spine sacrum and pelvis, an associated pathologic L1 fracture, liver metastases, and a mass in the ascending colon, likely the primary malignancy.   PMT has been consulted to assist with goals of care conversation.  Assessment: Metastatic cancer Pathological lumbar and thoracic vertebrae    Recommendations/Plan: DNR Continue current care Patient does not want chemo or surgery, but wants biopsy results and more information regarding prognosis before making final decisions PMT will continue to follow   Prognosis:  Poor prognosis with metastatic cancer and functional decline, hospice appropriate if aligned with goals of care  Discharge Planning: To Be Determined   Total time: I spent 35 minutes in the care of the patient today in the above activities and documenting the encounter.          Raymond Thobe Johnnette Litter, PA-C  Palliative Medicine Team Team phone #  [phone]  Thank you for allowing the Palliative Medicine Team to assist in the care of this patient. Please utilize secure chat with additional questions, if there is no response within 30 minutes please call the above phone number.  Palliative Medicine Team providers are available by phone from 7am to 7pm daily and can be reached through the team cell phone.  Should this patient require assistance outside of these hours, please call the patient's attending physician.

## 2021-06-19 NOTE — Progress Notes (Signed)
Initial Nutrition Assessment  DOCUMENTATION CODES:   Not applicable  INTERVENTION:  Provide Ensure Enlive po TID, each supplement provides 350 kcal and 20 grams of protein.  Encourage PO intake.   NUTRITION DIAGNOSIS:   Increased nutrient needs related to chronic illness (carcinoma) as evidenced by estimated needs.  GOAL:   Patient will meet greater than or equal to 90% of their needs  MONITOR:   PO intake, Supplement acceptance, Labs, Weight trends, Skin, I & O's  REASON FOR ASSESSMENT:   Malnutrition Screening Tool    ASSESSMENT:   71 year old male presents with 5 weeks of back pain. MRI imaging of the thoracolumbar spine which identified widespread osseous metastatic disease seen in the lumbar spine sacrum and pelvis with an associated pathologic L1 fracture with evidence for early epidural extension of tumor into the ventral epidural space. CT chest/abdomen/pelvis with contrast showed findings compatible with primary ascending colon carcinoma. Pt with abnormal imaging findings concerning for metastatic colon cancer.  Pt underwent ultrasound guided biopsy today. Diet has been advanced to a full liquid diet. Pt has been able to tolerate his PO  today. Pt reports poor appetite and intake over the past ~2 weeks and reports consuming 3 meals a day, however would have inadequate meal completion of ~50%. Pt reports a 15 lb weight loss since May 1st. Pt with a 7.5% weight loss in 1 month, significant for time frame. RD to order nutritional supplements to aid in caloric and protein needs. Family at bedside has been encouraging pt po intake.   NUTRITION - FOCUSED PHYSICAL EXAM:  Flowsheet Row Most Recent Value  Orbital Region No depletion  Upper Arm Region No depletion  Thoracic and Lumbar Region No depletion  Buccal Region No depletion  Temple Region No depletion  Clavicle Bone Region No depletion  Clavicle and Acromion Bone Region No depletion  Scapular Bone Region Unable to  assess  Dorsal Hand Unable to assess  Patellar Region No depletion  Anterior Thigh Region No depletion  Posterior Calf Region No depletion  Edema (RD Assessment) None  Hair Reviewed  Eyes Reviewed  Mouth Reviewed  Skin Reviewed  Nails Reviewed      Labs and medications reviewed.   Diet Order:   Diet Order             Diet full liquid Room service appropriate? Yes; Fluid consistency: Thin  Diet effective now                   EDUCATION NEEDS:   Not appropriate for education at this time  Skin:  Skin Assessment: Skin Integrity Issues: Skin Integrity Issues:: Incisions Incisions: abdomen  Last BM:  6/3  Height:   Ht Readings from Last 1 Encounters:  06/18/21 '5\' 9"'$  (1.753 m)    Weight:   Wt Readings from Last 1 Encounters:  06/18/21 83.5 kg   BMI:  Body mass index is 27.17 kg/m.  Estimated Nutritional Needs:   Kcal:  2050-2250  Protein:  105-115 grams  Fluid:  >/= 2 L/day  Corrin Parker, MS, RD, LDN RD pager number/after hours weekend pager number on Amion.

## 2021-06-19 NOTE — Plan of Care (Signed)

## 2021-06-19 NOTE — Progress Notes (Signed)
I was able to speak with the patient's partner Malka So and she confirmed that the patient is open now to radiation after meeting with other providers. He would be offered 10 fractions of radiotherapy and we would be able to simulate on Thursday and hopefully start therapy on Monday. We discussed treatment to the thoracolumbar spine to treat his pain and cover the areas as well with epidural extension to reduce risks of paralysis. He will need carelink coordination and this will be arranged with the floor team for simulation at that time he will sign written consent to proceed with therapy.     Carola Rhine, PAC

## 2021-06-19 NOTE — Procedures (Signed)
Interventional Radiology Procedure Note  Procedure: Ultrasound guided liver mass biopsy  Findings: Please refer to procedural dictation for full description.18 ga core of left lobe mass x3.  Gelfoam slurry needle track embolization.  Complications: None immediate  Estimated Blood Loss: < 5 ml  Recommendations: Strict 3 hour bedrest. Follow up Pathology results.   Ruthann Cancer, MD

## 2021-06-19 NOTE — Consult Note (Addendum)
Radiation Oncology         (336) [phone] ________________________________  Name: Raymond Henderson        MRN: [id number]  Date of Service: 06/19/21 DOB: 12-11-1950  GY:[id number], Walker Shadow, MD  Dr. Marin Olp   REFERRING PHYSICIAN: Dr. Marin Olp   DIAGNOSIS: The primary encounter diagnosis was Metastatic cancer to spine [facility]). Diagnoses of Pathological fracture due to neoplastic disease, unspecified fracture site, initial encounter, Severe pain, and Pathologic fracture of thoracic vertebrae, initial encounter were also pertinent to this visit.   HISTORY OF PRESENT ILLNESS: Raymond Henderson is a 71 y.o. male seen at the request of Dr. 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  The patient recently developed mid back pain and on set of constipation in the last 4 to 5 weeks.  He had been seen in urgent care and told that he possibly had a muscle strain and presented to a new PCP who ordered a radiographic work-up which has not been performed prior to coming to the emergency room on 06/17/2021.  He underwent chest x-ray showing no acute infiltrate or pleural effusion but an ill-defined lytic lesion in the lateral aspect of the second left rib.  An MRI of the thoracic spine showed widespread osseous metastatic disease throughout the thoracic spine with pathologic fracture at T11 with 35% height loss evidence of early epidural extension between T7 and T9 and T11 innumerable lesions throughout the liver that was visualized partially, MRI of the lumbar spine also showed widespread metastatic disease with pathologic fracture at L1 with 20% height loss evidence for early epidural extension into the ventral epidural space at L1 and L5, an enlarged aortocaval nodes.  CT chest abdomen pelvis showed primary ascending colon mass with increased pericolonic soft tissue infiltration with significant luminal narrowing without signs of high-grade bowel obstruction, extensive liver metastases, upper abdominal and mesenteric  adenopathy, enlarged subcarinal lymph node, lytic lesions throughout bilateral proximal femurs stigmata of emphysema, atherosclerotic disease and coronary artery calcifications noted.  He met with IR and underwent an ultrasound-guided biopsy of his liver this morning.  Dr. Marin Olp has met with the patient and recommended completion of his work-up.  He is also met with palliative care and is not interested in pursuing resuscitation or chemotherapy.  He was leaning towards end-of-life care with hospice but has also discussed possibilities of radiation with Dr. Marin Olp and hence we have been asked to see the patient to consider radiotherapy.    PREVIOUS RADIATION THERAPY: No    PAST MEDICAL HISTORY:  Past Medical History:  Diagnosis Date   No pertinent past medical history        PAST SURGICAL HISTORY:History reviewed. No pertinent surgical history.   FAMILY HISTORY:  Family History  Problem Relation Age of Onset   Heart disease Neg Hx      SOCIAL HISTORY:  reports that he has never smoked. He has never used smokeless tobacco. He reports that he does not drink alcohol and does not use drugs.  It appears that the patient is single and lives in [place].   ALLERGIES: Penicillins   MEDICATIONS:  Current Facility-Administered Medications  Medication Dose Route Frequency Provider Last Rate Last Admin   acetaminophen (TYLENOL) tablet 650 mg  650 mg Oral Q6H PRN Shalhoub, Sherryll Burger, MD       Or   acetaminophen (TYLENOL) suppository 650 mg  650 mg Rectal Q6H PRN Shalhoub, Sherryll Burger, MD       fentaNYL (SUBLIMAZE) 100 MCG/2ML injection  gelatin adsorbable (GELFOAM/SURGIFOAM) 12-7 MM sponge 12-7 mm            HYDROmorphone (DILAUDID) injection 0.5 mg  0.5 mg Intravenous Q4H PRN Elgergawy, Silver Huguenin, MD   0.5 mg at 06/18/21 2338   iohexol (OMNIPAQUE) 300 MG/ML solution 100 mL  100 mL Intravenous Once PRN Shalhoub, Sherryll Burger, MD       lidocaine (PF) (XYLOCAINE) 1 % injection             midazolam (VERSED) 2 MG/2ML injection            ondansetron (ZOFRAN) tablet 4 mg  4 mg Oral Q6H PRN Shalhoub, Sherryll Burger, MD       Or   ondansetron (ZOFRAN) injection 4 mg  4 mg Intravenous Q6H PRN Shalhoub, Sherryll Burger, MD       oxyCODONE-acetaminophen (PERCOCET/ROXICET) 5-325 MG per tablet 1 tablet  1 tablet Oral Q4H PRN Shalhoub, Sherryll Burger, MD   1 tablet at 06/19/21 0411   polyethylene glycol (MIRALAX / GLYCOLAX) packet 17 g  17 g Oral Daily PRN Shalhoub, Sherryll Burger, MD       polyethylene glycol (MIRALAX / GLYCOLAX) packet 17 g  17 g Oral BID Elgergawy, Silver Huguenin, MD   17 g at 06/19/21 1051   senna-docusate (Senokot-S) tablet 2 tablet  2 tablet Oral BID Elgergawy, Silver Huguenin, MD   2 tablet at 06/19/21 1051     REVIEW OF SYSTEMS: On review of systems, the patient reports that his pain is in the mid spine.  He was not willing to engage in further discussion with me to find out more detailed information about the quality of his symptoms.    PHYSICAL EXAM:  Wt Readings from Last 3 Encounters:  06/18/21 184 lb (83.5 kg)   Temp Readings from Last 3 Encounters:  06/19/21 98 F (36.7 C)   BP Readings from Last 3 Encounters:  06/19/21 135/86   Pulse Readings from Last 3 Encounters:  06/19/21 98   Pain Assessment Pain Score: 0-No pain/10  Unable to assess given encounter type    ECOG = unable to assess  0 - Asymptomatic (Fully active, able to carry on all predisease activities without restriction)  1 - Symptomatic but completely ambulatory (Restricted in physically strenuous activity but ambulatory and able to carry out work of a light or sedentary nature. For example, light housework, office work)  2 - Symptomatic, <50% in bed during the day (Ambulatory and capable of all self care but unable to carry out any work activities. Up and about more than 50% of waking hours)  3 - Symptomatic, >50% in bed, but not bedbound (Capable of only limited self-care, confined to bed or chair 50% or  more of waking hours)  4 - Bedbound (Completely disabled. Cannot carry on any self-care. Totally confined to bed or chair)  5 - Death   Eustace Pen MM, Creech RH, Tormey DC, et al. [phone]). "Toxicity and response criteria of the [facility]". House Oncol. 5 (6): 649-55    LABORATORY DATA:  Lab Results  Component Value Date   WBC 15.0 (H) 06/19/2021   HGB 12.4 (L) 06/19/2021   HCT 37.7 (L) 06/19/2021   MCV 81.6 06/19/2021   PLT 239 06/19/2021   Lab Results  Component Value Date   NA 134 (L) 06/19/2021   K 4.5 06/19/2021   CL 99 06/19/2021   CO2 27 06/19/2021   Lab Results  Component Value Date  ALT 40 06/19/2021   AST 63 (H) 06/19/2021   ALKPHOS 792 (H) 06/19/2021   BILITOT 2.1 (H) 06/19/2021      RADIOGRAPHY: MR THORACIC SPINE W WO CONTRAST  Result Date: 06/18/2021 CLINICAL DATA:  Initial evaluation for acute back pain, trauma. EXAM: MRI THORACIC WITHOUT AND WITH CONTRAST TECHNIQUE: Multiplanar and multiecho pulse sequences of the thoracic spine were obtained without and with intravenous contrast. CONTRAST:  8.70m GADAVIST GADOBUTROL 1 MMOL/ML IV SOLN COMPARISON:  None Available. FINDINGS: Alignment: Physiologic with preservation of the normal thoracic kyphosis. Vertebrae: There are innumerable T1 hypointense, stir hyperintense enhancing lesions seen throughout the thoracic spine, consistent with widespread osseous metastatic disease. There is involvement of essentially all levels. Posterior elements are involved at numerous levels as well. Associated pathologic fracture at T11 with up to 35% height loss. Evidence for early epidural extension into the ventral epidural space at T7, T9, and T11. Prominent extra osseous extension with soft tissue component seen about the left rib and transverse process of T7, measuring approximately 3.3 x 2.4 cm (series 20, image 20). Multiple lesion seen involving the partially visualized posterior ribs as well. No significant  or high-grade spinal stenosis at this time. Cord: Early epidural extension at the levels of T7, T9, and T11. No high-grade spinal stenosis or frank cord impingement. Cord itself is normal in caliber in appearance. No abnormal intramedullary enhancement. Paraspinal and other soft tissues: Extraosseous extension with associated soft tissue component about the left transverse process of T7 as above. More mild extraosseous extension also noted on the right at T4 and on the left at T9. Innumerable lesions seen throughout the partially visualized liver, likely reflecting metastatic disease. Disc levels: No significant underlying disc pathology. No high-grade spinal stenosis. Mild foraminal narrowing on the right at T6-7 and T11-12 related to extraosseous extension of tumor. Foramina otherwise remain patent at this time. IMPRESSION: 1. Widespread osseous metastatic disease throughout the thoracic spine as above. 2. Associated pathologic fracture at T11 with up to 35% height loss. 3. Evidence for early epidural extension at the levels of T7, T9, and T11. No high-grade spinal stenosis or frank cord impingement at this time. 4. Innumerable lesions throughout the partially visualized liver, likely reflecting metastatic disease. Electronically Signed   By: BJeannine BogaM.D.   On: 06/18/2021 01:07   MR Lumbar Spine W Wo Contrast  Result Date: 06/18/2021 CLINICAL DATA:  Initial evaluation for trauma, severe back pain. EXAM: MRI LUMBAR SPINE WITHOUT AND WITH CONTRAST TECHNIQUE: Multiplanar and multiecho pulse sequences of the lumbar spine were obtained without and with intravenous contrast. CONTRAST:  8.551mGADAVIST GADOBUTROL 1 MMOL/ML IV SOLN COMPARISON:  None Available. FINDINGS: Segmentation: Standard. Lowest well-formed disc space labeled the L5-S1 level. Alignment: Physiologic with preservation of the normal lumbar lordosis. No listhesis. Vertebrae: Widespread osseous metastatic disease seen throughout the lumbar  spine as well as the visualized sacrum and pelvis. There is involvement of essentially all levels. Scattered posterior element involvement seen at numerable levels as well. Associated pathologic fracture at L1 with mild 20% height loss. Evidence for early epidural extension of tumor into the ventral epidural space at L1 and L5 (series 27, images 9, 7). Conus medullaris and cauda equina: Conus extends to the L1 level. Conus and cauda equina appear normal. No abnormal intramedullary enhancement. Paraspinal and other soft tissues: Mild extraosseous extension of tumor with involvement of the adjacent paraspinous soft tissues noted at multiple levels, most notable at the right transverse process of L3 (series 32,  image 19). Scattered edema within the lower posterior paraspinous musculature likely reactive. Enlarged aortocaval lymph nodes measuring up to 1.5 cm in short axis, concerning for nodal metastatic disease (series 29, images 5, 10). Disc levels: T12-L1: Pathologic compression fracture of L1 with mild epidural extension of tumor. Secondary flattening of the ventral thecal sac, eccentric to the left. No significant spinal stenosis at this time. Foramina remain patent. L1-2:  Unremarkable. L2-3: Small left extraforaminal disc protrusion with annular fissure. Mild facet hypertrophy. No stenosis. L3-4: Disc desiccation with mild disc bulge. Mild facet hypertrophy. No stenosis. L4-5: Disc desiccation with mild disc bulge. Mild to moderate facet hypertrophy. No significant stenosis. L5-S1: Degenerative intervertebral disc space narrowing with diffuse disc bulge and reactive endplate change. Epidural extension of tumor into the right ventral epidural space (series 29, image 33). Mild to moderate bilateral facet hypertrophy. Mild narrowing of the right lateral recess without significant spinal stenosis. Mild left L5 foraminal narrowing. Right neural foramen remains patent. IMPRESSION: 1. Widespread osseous metastatic  disease throughout the lumbar spine as well as the visualized sacrum and pelvis. 2. Associated pathologic fracture of L1 with mild 20% height loss. Evidence for early epidural extension of tumor into the ventral epidural space at L1 and L5. No significant spinal stenosis at this time. 3. Enlarged aortocaval lymph nodes, concerning for nodal metastatic disease. 4. Underlying mild multilevel degenerative spondylosis as above, most pronounced at L5-S1. Electronically Signed   By: Jeannine Boga M.D.   On: 06/18/2021 01:16   CT CHEST ABDOMEN PELVIS W CONTRAST  Result Date: 06/18/2021 CLINICAL DATA:  Metastatic disease.  Staging. EXAM: CT CHEST, ABDOMEN, AND PELVIS WITH CONTRAST TECHNIQUE: Multidetector CT imaging of the chest, abdomen and pelvis was performed following the standard protocol during bolus administration of intravenous contrast. RADIATION DOSE REDUCTION: This exam was performed according to the departmental dose-optimization program which includes automated exposure control, adjustment of the mA and/or kV according to patient size and/or use of iterative reconstruction technique. CONTRAST:  115m OMNIPAQUE IOHEXOL 300 MG/ML  SOLN COMPARISON:  MRI thoracic and lumbar spine 06/18/2021 FINDINGS: CT CHEST FINDINGS Cardiovascular: The heart size appears within normal limits. No pericardial effusion identified. Aortic atherosclerosis and coronary artery calcifications. Mediastinum/Nodes: Thyroid gland, trachea and esophagus are unremarkable. Subcarinal lymph node measures 1.5 cm, image 23/3. No enlarged axillary, supraclavicular, or hilar lymph nodes. Lungs/Pleura: No pleural effusion. Mild changes of emphysema. No suspicious pulmonary nodule or mass. Musculoskeletal: Multifocal lytic bone metastases identified involving the ribs, sternum and spine. Index lesions include: -expansile lesion with associated pathologic fracture involves the anterolateral aspect of the right second rib measuring 2.1 cm,  image 19/5. -lesion involving the sternal manubrium measures 0.9 cm, 26/5. -lesion involving the T11 vertebra measures 3.6 cm and extends into the right posterior elements, image 125/5. Associated pathologic compression fracture is noted with loss of approximately 25% of the vertebral body height. (See thoracic spine MRI for further details regarding the thoracic spine metastasis.) CT ABDOMEN PELVIS FINDINGS Hepatobiliary: Diffuse liver metastases are identified throughout both lobes of the liver. Lesions are too numerous to count. Index lesions include: -segment 2 lesion measuring 2.4 x 2.0 cm, image 44/3. -partially exophytic lesion involving segment 6 measures 2.1 x 1.7 cm, image 62/3. -segment 4B lesion measures 2.1 x 1.4 cm, image 5/3. The gallbladder appears surgically absent. Pancreas: Unremarkable. No pancreatic ductal dilatation or surrounding inflammatory changes. Spleen: Normal in size without focal abnormality. Adrenals/Urinary Tract: Adrenal glands are unremarkable. Kidneys are normal, without renal calculi, focal  lesion, or hydronephrosis. Bladder is unremarkable. Stomach/Bowel: Stomach appears normal. No signs of small bowel wall thickening, inflammation, or distension. Circumferential mass is identified involving the mid to distal ascending colon measures 4.4 x 5.2 by 8.9 cm, image 67/3. There is significant luminal narrowing involving the affected segment without signs of bowel obstruction. Transmural extension of tumor is identified with increased pericolonic soft tissue infiltration. Vascular/Lymphatic: Aortic atherosclerosis without aneurysm. Upper abdominal adenopathy is identified: -portacaval lymph node measures 2.3 cm short axis, image 53/3. -porta hepatic lymph node measures 2.3 cm, image 46/2. -ileocolic mesenteric lymph node measures 2.9 cm, image 70/3. No pelvic or inguinal adenopathy. Reproductive: Mild prostate gland enlargement. Other: No ascites. Peritoneal nodule within the left  hemiabdomen is identified measuring 8 mm, image 70/3. Musculoskeletal: Multifocal lytic bone metastases identified involving the lumbar spine and bony pelvis. Index lesions include: -L2 vertebral body lesion measures 2.7 cm, image 72/7. -left inferior pubic rami lesion measures 1.5 cm, image 130/3. -right acetabular lesion measures 0.9 cm, image 111/3. Bilateral proximal femur lesions are identified: -Lesion involving the lesser trochanter of the proximal left femur measures 1.7 cm, image 127/3. -intertrochanteric lesion involving the proximal right femur measures 2.3 cm, image 126/3. IMPRESSION: 1. 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 Transmural extension of tumor is identified with increased pericolonic soft tissue infiltration. There is significant luminal narrowing involving the affected segment of colon without signs of high-grade bowel obstruction. 2. Extensive liver metastases. 3. Upper abdominal and mesenteric adenopathy compatible with metastatic adenopathy. Enlarged subcarinal lymph node compatible with metastatic disease. Please see report from MRI of the thoracic and lumbar spine, also performed today. 4. There are lytic lesions involving bilateral proximal femurs which may be of orthopedic significance. 5. Coronary artery calcifications noted. 6. Aortic Atherosclerosis (ICD10-I70.0) and Emphysema (ICD10-J43.9). Electronically Signed   By: Kerby Moors M.D.   On: 06/18/2021 05:36   DG Chest Portable 1 View  Result Date: 06/17/2021 CLINICAL DATA:  Cough with leukocytosis. EXAM: PORTABLE CHEST 1 VIEW COMPARISON:  None Available. FINDINGS: Multiple overlying radiopaque cardiac lead wires are noted. The heart size and mediastinal contours are within normal limits. Both lungs are clear. An ill-defined lytic area is seen involving the lateral aspect of the second left rib. IMPRESSION: 1. No acute infiltrate or pleural effusion. 2. Ill-defined lytic area involving the lateral  aspect of the second left rib. Further evaluation with nonemergent chest CT is recommended. Electronically Signed   By: Virgina Norfolk M.D.   On: 06/17/2021 22:41       IMPRESSION/PLAN: 1. Probable Stage IV Colon Cancer with liver, nodal, and bone metastases. Dr. Lisbeth Renshaw has reviewed the patient's course and radiographic work up.  It appears that he does have what looks to be metastatic colon cancer.  He has a biopsy of his liver pending from this morning and his diagnosis will likely be confirmed later this week.  He has been clear with medical oncology palliative and myself by phone that he is not interested in having systemic chemotherapy.  Today I discussed the options of palliative radiotherapy which she was not willing to engage much in conversation regarding.  I explained that x-ray treatment would be considered to use for helping quality of life related to his pain and possibly to reduce risks of changes in movement to treat other areas of the spine.  He was not willing to go any further in a conversation and prefers to talk to his inpatient team before making a decision about  whether he would like to revisit the discussion about radiation.  Please reach out to our team if the patient desires additional conversation.  In a visit lasting 45 minutes, greater than 50% of the time was spent f by phone and and floor time discussing the patient's condition, in preparation for the discussion, and coordinating the patient's care.     Carola Rhine, [facility]   **Disclaimer: This note was dictated with voice recognition software. Similar sounding words can inadvertently be transcribed and this note may contain transcription errors which may not have been corrected upon publication of note.**

## 2021-06-19 NOTE — Evaluation (Addendum)
Occupational Therapy Evaluation Patient Details Name: Raymond Henderson MRN: [id number] DOB: 08-03-1950 Today's Date: 06/19/2021   History of Present Illness Pt is a 71 y/o male admitted secondary to worsening back pain. 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 Found to have liver mets and suspected primary lesion in colon. Workup pending. No pertinent PMH.   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 Pt with progressive back pain that impacted ability to complete tasks leading to this admission. Along with significant back pain, pt with deficits in strength, standing balance and endurance. With premedication, pt agreeable to attempt all tasks with OT. Emphasized back precautions to minimize pain throughout with Min A required for bed mobility and taking steps at bedside using RW. Pt requires Min A for UB ADL and Max A for LB ADLs due to deficits. Pt's friend, Horris Latino, present and very supportive though concerned about his ability to manage at home given current medical complexities. Pending pain control and GOC regarding cancer treatment, pt may benefit from AIR level therapies at DC as pt is significantly below functional baseline. Plan to progress back precautions with LB ADLs and bathroom mobility in next sessions.       Recommendations for follow up therapy are one component of a multi-disciplinary discharge planning process, led by the attending physician.  Recommendations may be updated based on patient status, additional functional criteria and insurance authorization.   Follow Up Recommendations  Acute inpatient rehab (3hours/day)    Assistance Recommended at Discharge Frequent or constant Supervision/Assistance  Patient can return home with the following A lot of help with walking and/or transfers;A lot of help with bathing/dressing/bathroom;Assistance with cooking/housework;Assist for  transportation    Functional Status Assessment  Patient has had a recent decline in their functional status and demonstrates the ability to make significant improvements in function in a reasonable and predictable amount of time.  Equipment Recommendations  Other (comment) (Rolling walker; TBD pending progress)    Recommendations for Other Services Rehab consult     Precautions / Restrictions Precautions Precautions: Back;Other (comment) Precaution Booklet Issued: No Precaution Comments: monitor HR Restrictions Weight Bearing Restrictions: No      Mobility Bed Mobility Overal bed mobility: Needs Assistance Bed Mobility: Sidelying to Sit, Sit to Sidelying, Rolling Rolling: Modified independent (Device/Increase time) Sidelying to sit: Min assist     Sit to sidelying: Min assist General bed mobility comments: guided in log rolling with pt able to grasp onto bedrail with Min A only to lift trunk with handheld assist. Min A to get BLE back into bed via log rolling    Transfers Overall transfer level: Needs assistance Equipment used: Rolling walker (2 wheels) Transfers: Sit to/from Stand Sit to Stand: Min guard, From elevated surface           General transfer comment: able to stand from elevated surface and reporting improving back pain while standing. Min A for side steps at bedside with cues to push through BUE on RW to take pressure off of back/LEs with noted increased effort needed      Balance Overall balance assessment: Needs assistance Sitting-balance support: No upper extremity supported, Feet supported Sitting balance-Leahy Scale: Fair     Standing balance support: Bilateral upper extremity supported, During functional activity, Reliant on assistive device for balance Standing balance-Leahy Scale: Poor Standing balance comment: reliant on BUE in standing for balance and to offload pressure  ADL either performed or  assessed with clinical judgement   ADL Overall ADL's : Needs assistance/impaired Eating/Feeding: Set up;Bed level;Sitting   Grooming: Set up;Sitting   Upper Body Bathing: Minimal assistance;Sitting   Lower Body Bathing: Maximal assistance;Sit to/from stand   Upper Body Dressing : Minimal assistance;Sitting   Lower Body Dressing: Maximal assistance;Sit to/from stand Lower Body Dressing Details (indicate cue type and reason): assist to don socks, began education on back precautions for LB dressing tasks though pt pain limited in attempting today     Toileting- Clothing Manipulation and Hygiene: Maximal assistance;Sit to/from stand         General ADL Comments: Limited by significant back pain with movement though agreeable to attempt all tasks with cues for back precautions to minimize pain when possible     Vision Baseline Vision/History: 1 Wears glasses (reading) Ability to See in Adequate Light: 0 Adequate Patient Visual Report: No change from baseline Vision Assessment?: No apparent visual deficits     Perception     Praxis      Pertinent Vitals/Pain Pain Assessment Pain Assessment: Faces Faces Pain Scale: Hurts even more Pain Location: low back Pain Descriptors / Indicators: Grimacing, Guarding Pain Intervention(s): Monitored during session, Premedicated before session, RN gave pain meds during session, Repositioned     Hand Dominance Right   Extremity/Trunk Assessment Upper Extremity Assessment Upper Extremity Assessment: Generalized weakness   Lower Extremity Assessment Lower Extremity Assessment: Defer to PT evaluation   Cervical / Trunk Assessment Cervical / Trunk Assessment: Other exceptions Cervical / Trunk Exceptions: metastatic lesions to spine   Communication Communication Communication: No difficulties   Cognition Arousal/Alertness: Awake/alert Behavior During Therapy: WFL for tasks assessed/performed Overall Cognitive Status: Within  Functional Limits for tasks assessed                                 General Comments: hx of being "loopy" from pain medications. appropriate throughout session, aware of seriousness of cancer     General Comments  HR up to 115bpm with activity. Pt's friend, Horris Latino present and very supportive    Exercises     Shoulder Instructions      Home Living Family/patient expects to be discharged to:: Private residence Living Arrangements: Alone Available Help at Discharge: Friend(s);Available PRN/intermittently Denman George, Bonnie) Type of Home: Apartment Home Access: Level entry     Home Layout: One level     Bathroom Shower/Tub: Teacher, early years/pre: Standard     Home Equipment: Grab bars - tub/shower          Prior Functioning/Environment Prior Level of Function : Independent/Modified Independent;Driving;Working/employed             Mobility Comments: no use of AD ADLs Comments: works in [employer]. Independent in all daily tasks, has been limited by progressive back pain in recent 6 weeks        OT Problem List: Decreased strength;Decreased activity tolerance;Impaired balance (sitting and/or standing);Decreased knowledge of use of DME or AE;Decreased knowledge of precautions;Pain      OT Treatment/Interventions: Self-care/ADL training;Therapeutic exercise;Energy conservation;DME and/or AE instruction;Therapeutic activities;Patient/family education;Balance training    OT Goals(Current goals can be found in the care plan section) Acute Rehab OT Goals Patient Stated Goal: minimize pain, be able to go home. Pt's friend Horris Latino unsure if pt will be able to go home in this current state - may need rehab OT Goal Formulation:  With patient/family Time For Goal Achievement: 07/03/21 Potential to Achieve Goals: Good  OT Frequency: Min 2X/week    Co-evaluation              AM-PAC OT "6 Clicks" Daily Activity     Outcome  Measure Help from another person eating meals?: A Little Help from another person taking care of personal grooming?: A Little Help from another person toileting, which includes using toliet, bedpan, or urinal?: A Lot Help from another person bathing (including washing, rinsing, drying)?: A Lot Help from another person to put on and taking off regular upper body clothing?: A Little Help from another person to put on and taking off regular lower body clothing?: A Lot 6 Click Score: 15   End of Session Equipment Utilized During Treatment: Rolling walker (2 wheels) Nurse Communication: Mobility status;Precautions  Activity Tolerance: Patient tolerated treatment well;Patient limited by pain Patient left: in bed;with call bell/phone within reach;with bed alarm set;with SCD's reapplied;with family/visitor present  OT Visit Diagnosis: Other abnormalities of gait and mobility (R26.89);Pain Pain - part of body:  (back)                Time: 1314-3888 OT Time Calculation (min): 31 min Charges:  OT General Charges $OT Visit: 1 Visit OT Evaluation $OT Eval Moderate Complexity: 1 Mod OT Treatments $Self Care/Home Management : 8-22 mins  Malachy Chamber, OTR/L Acute Rehab Services Office: [phone]   Layla Maw 06/19/2021, 2:18 PM

## 2021-06-19 NOTE — Progress Notes (Signed)
PROGRESS NOTE    Raymond Henderson  ZOX:[id number] DOB: 1950/02/22 DOA: 06/17/2021 PCP: Kathalene Frames, MD    Chief Complaint  Patient presents with   Fall   Back Pain    Brief Narrative:    71 year old male with no past medical history who presents to [facility] emergency department with complaints of back pain.   Patient explains that approximately 5 weeks ago he began to experience back pain.  As well he does endorse abdominal pain, and constipation for last 10 days, his work-up significant for ascending colon mass, which appears to be metastatic to thoracolumbar spine which identified widespread osseous metastatic disease seen in the lumbar spine sacrum and pelvis with an associated pathologic L1 fracture without foraminal stenosis.  As well with evidence of liver metastasis, given his intractable pain, he was admitted for further work-up.    Assessment & Plan:   Principal Problem:   Metastatic cancer [facility]) Active Problems:   Pathologic fracture of lumbar vertebra, initial encounter   Pathologic fracture of thoracic vertebrae, initial encounter   Hypercalcemia   Normocytic anemia   Goals of care, counseling/discussion   Metastatic cancer ([place]) Concern for metastatic colon cancer with liver mets, pathologic fracture at T11 and L1 -Oncology input greatly appreciated, waiting final results on biopsy. -Ultrasound guided biopsy of liver mets done 6/5, results are pending. -Patient with significant back pain due to thoracolumbar metastasis, radiology oncology input greatly appreciated, plan for radiation therapy next Monday(simulation is planned this Thursday morning, please arrange with [facility] for him to arrive at 7:30 AM and radiation oncology department long for simulation) -Palliative medicine has been involved as well, patient is undecided about what route he will take regarding therapy, he would like to wait for biopsy results first, but he is not looking for any  aggressive therapy. -Continue with as needed Dilaudid and oxycodone for pain. -Patient with no bowel movements for last few days, continue with good bowel regimen   Pathologic fracture of lumbar vertebra, initial encounter -Please see above discussion -With as needed pain management. -Plan for radiation treatment next Monday, simulation planned for Thursday, please arrange for [facility] transport for Thursday morning   Pathologic fracture of thoracic vertebrae, initial encounter - Please see assessment and plan above   Hypercalcemia -Continue with IV fluid, received Zometa  Transaminitis -This is secondary to liver mets, but alk phos significantly elevated, and this is likely due to bone metastasis    Goals of care, counseling/discussion - Overall prognosis with likely metastatic adenocarcinoma of the colon is poor -Overall patient does not want any aggressive therapy, but he would like to see what the biopsy results finding before making any decisions, I have discussed with him, and Bonnie at bedside (significant other), patient would like her to be his HCPOA.         DVT prophylaxis: SCD, Lovenox Code Status: DNR Family Communication: Discussed with his significant other Bonnie Disposition:   Status is: inpatient   Consultants:  Oncology Palliative medicine Interventional radiology  Subjective:  Complaining of significant lower back pain and abdominal pain, no bowel movement yet, patient had episode of confusion overnight after he received IV pain meds, as well he came back confused from procedure earlier today as he received IV fentanyl.    Objective: Vitals:   06/19/21 1115 06/19/21 1130 06/19/21 1145 06/19/21 1200  BP: 137/83 139/89 135/87 138/86  Pulse: 100 97 (!) 108 (!) 109  Resp: '18 18 18 18  ' Temp: 98.2 F (  36.8 C) 98.1 F (36.7 C) 98.2 F (36.8 C) 98.9 F (37.2 C)  TempSrc: Oral Tympanic Oral Oral  SpO2: 92% 94% 93% 93%  Weight:      Height:         Intake/Output Summary (Last 24 hours) at 06/19/2021 1624 Last data filed at 06/18/2021 2330 Gross per 24 hour  Intake 120 ml  Output 575 ml  Net -455 ml   Filed Weights   06/18/21 0835  Weight: 83.5 kg    Examination:  Awake Alert, Oriented X 3, No new F.N deficits, Normal affect Symmetrical Chest wall movement, Good air movement bilaterally, CTAB RRR,No Gallops,Rubs or new Murmurs, No Parasternal Heave +ve B.Sounds, Abd Soft, abdomen  with diffuse tenderness to palpation, No rebound - guarding or rigidity. No Cyanosis, Clubbing or edema, No new Rash or bruise        Data Reviewed: I have personally reviewed following labs and imaging studies  CBC: Recent Labs  Lab 06/17/21 2138 06/18/21 0810 06/19/21 0232  WBC 17.0* 14.0* 15.0*  NEUTROABS  --  10.8* 12.1*  HGB 11.4* 11.4* 12.4*  HCT 36.4* 36.1* 37.7*  MCV 83.3 83.6 81.6  PLT 346 326 604    Basic Metabolic Panel: Recent Labs  Lab 06/17/21 2138 06/18/21 0810 06/19/21 0443  NA 132* 135 134*  K 4.0 4.1 4.5  CL 96* 98 99  CO2 '25 27 27  ' GLUCOSE 110* 106* 123*  BUN 24* 19 20  CREATININE 1.01 0.87 0.95  CALCIUM 10.5* 10.8* 11.7*  MG  --   --  2.3    GFR: Estimated Creatinine Clearance: 70.3 mL/min (by C-G formula based on SCr of 0.95 mg/dL).  Liver Function Tests: Recent Labs  Lab 06/18/21 0509 06/18/21 0810 06/19/21 0443  AST 73* 68* 63*  ALT 46* 46* 40  ALKPHOS 725* 759* 792*  BILITOT 2.0* 1.8* 2.1*  PROT 6.7 6.5 6.6  ALBUMIN 2.5* 2.4* 2.5*    CBG: No results for input(s): GLUCAP in the last 168 hours.   Recent Results (from the past 240 hour(s))  Culture, blood (Routine X 2) w Reflex to ID Panel     Status: None (Preliminary result)   Collection Time: 06/18/21  5:09 AM   Specimen: BLOOD LEFT HAND  Result Value Ref Range Status   Specimen Description BLOOD LEFT HAND  Final   Special Requests   Final    AEROBIC BOTTLE ONLY Blood Culture results may not be optimal due to an inadequate  volume of blood received in culture bottles   Culture   Final    NO GROWTH 1 DAY Performed at [facility], [place] [address] [place], [place] [address]    Report Status PENDING  Incomplete  Culture, blood (Routine X 2) w Reflex to ID Panel     Status: None (Preliminary result)   Collection Time: 06/18/21  5:18 AM   Specimen: BLOOD  Result Value Ref Range Status   Specimen Description BLOOD LEFT ANTECUBITAL  Final   Special Requests AEROBIC BOTTLE ONLY Blood Culture adequate volume  Final   Culture   Final    NO GROWTH 1 DAY Performed at [facility], [place] [address], [place], [place] [address]    Report Status PENDING  Incomplete         Radiology Studies: MR THORACIC SPINE W WO CONTRAST  Result Date: 06/18/2021 CLINICAL DATA:  Initial evaluation for acute back pain, trauma. EXAM: MRI THORACIC WITHOUT AND WITH CONTRAST  TECHNIQUE: Multiplanar and multiecho pulse sequences of the thoracic spine were obtained without and with intravenous contrast. CONTRAST:  8.10m GADAVIST GADOBUTROL 1 MMOL/ML IV SOLN COMPARISON:  None Available. FINDINGS: Alignment: Physiologic with preservation of the normal thoracic kyphosis. Vertebrae: There are innumerable T1 hypointense, stir hyperintense enhancing lesions seen throughout the thoracic spine, consistent with widespread osseous metastatic disease. There is involvement of essentially all levels. Posterior elements are involved at numerous levels as well. Associated pathologic fracture at T11 with up to 35% height loss. Evidence for early epidural extension into the ventral epidural space at T7, T9, and T11. Prominent extra osseous extension with soft tissue component seen about the left rib and transverse process of T7, measuring approximately 3.3 x 2.4 cm (series 20, image 20). Multiple lesion seen involving the partially visualized posterior ribs as well. No significant or high-grade spinal stenosis at this time. Cord: Early epidural extension at  the levels of T7, T9, and T11. No high-grade spinal stenosis or frank cord impingement. Cord itself is normal in caliber in appearance. No abnormal intramedullary enhancement. Paraspinal and other soft tissues: Extraosseous extension with associated soft tissue component about the left transverse process of T7 as above. More mild extraosseous extension also noted on the right at T4 and on the left at T9. Innumerable lesions seen throughout the partially visualized liver, likely reflecting metastatic disease. Disc levels: No significant underlying disc pathology. No high-grade spinal stenosis. Mild foraminal narrowing on the right at T6-7 and T11-12 related to extraosseous extension of tumor. Foramina otherwise remain patent at this time. IMPRESSION: 1. Widespread osseous metastatic disease throughout the thoracic spine as above. 2. Associated pathologic fracture at T11 with up to 35% height loss. 3. Evidence for early epidural extension at the levels of T7, T9, and T11. No high-grade spinal stenosis or frank cord impingement at this time. 4. Innumerable lesions throughout the partially visualized liver, likely reflecting metastatic disease. Electronically Signed   By: BJeannine BogaM.D.   On: 06/18/2021 01:07   MR Lumbar Spine W Wo Contrast  Result Date: 06/18/2021 CLINICAL DATA:  Initial evaluation for trauma, severe back pain. EXAM: MRI LUMBAR SPINE WITHOUT AND WITH CONTRAST TECHNIQUE: Multiplanar and multiecho pulse sequences of the lumbar spine were obtained without and with intravenous contrast. CONTRAST:  8.554mGADAVIST GADOBUTROL 1 MMOL/ML IV SOLN COMPARISON:  None Available. FINDINGS: Segmentation: Standard. Lowest well-formed disc space labeled the L5-S1 level. Alignment: Physiologic with preservation of the normal lumbar lordosis. No listhesis. Vertebrae: Widespread osseous metastatic disease seen throughout the lumbar spine as well as the visualized sacrum and pelvis. There is involvement of  essentially all levels. Scattered posterior element involvement seen at numerable levels as well. Associated pathologic fracture at L1 with mild 20% height loss. Evidence for early epidural extension of tumor into the ventral epidural space at L1 and L5 (series 27, images 9, 7). Conus medullaris and cauda equina: Conus extends to the L1 level. Conus and cauda equina appear normal. No abnormal intramedullary enhancement. Paraspinal and other soft tissues: Mild extraosseous extension of tumor with involvement of the adjacent paraspinous soft tissues noted at multiple levels, most notable at the right transverse process of L3 (series 32, image 19). Scattered edema within the lower posterior paraspinous musculature likely reactive. Enlarged aortocaval lymph nodes measuring up to 1.5 cm in short axis, concerning for nodal metastatic disease (series 29, images 5, 10). Disc levels: T12-L1: Pathologic compression fracture of L1 with mild epidural extension of tumor. Secondary flattening of the ventral thecal  sac, eccentric to the left. No significant spinal stenosis at this time. Foramina remain patent. L1-2:  Unremarkable. L2-3: Small left extraforaminal disc protrusion with annular fissure. Mild facet hypertrophy. No stenosis. L3-4: Disc desiccation with mild disc bulge. Mild facet hypertrophy. No stenosis. L4-5: Disc desiccation with mild disc bulge. Mild to moderate facet hypertrophy. No significant stenosis. L5-S1: Degenerative intervertebral disc space narrowing with diffuse disc bulge and reactive endplate change. Epidural extension of tumor into the right ventral epidural space (series 29, image 33). Mild to moderate bilateral facet hypertrophy. Mild narrowing of the right lateral recess without significant spinal stenosis. Mild left L5 foraminal narrowing. Right neural foramen remains patent. IMPRESSION: 1. Widespread osseous metastatic disease throughout the lumbar spine as well as the visualized sacrum and  pelvis. 2. Associated pathologic fracture of L1 with mild 20% height loss. Evidence for early epidural extension of tumor into the ventral epidural space at L1 and L5. No significant spinal stenosis at this time. 3. Enlarged aortocaval lymph nodes, concerning for nodal metastatic disease. 4. Underlying mild multilevel degenerative spondylosis as above, most pronounced at L5-S1. Electronically Signed   By: Jeannine Boga M.D.   On: 06/18/2021 01:16   CT CHEST ABDOMEN PELVIS W CONTRAST  Result Date: 06/18/2021 CLINICAL DATA:  Metastatic disease.  Staging. EXAM: CT CHEST, ABDOMEN, AND PELVIS WITH CONTRAST TECHNIQUE: Multidetector CT imaging of the chest, abdomen and pelvis was performed following the standard protocol during bolus administration of intravenous contrast. RADIATION DOSE REDUCTION: This exam was performed according to the departmental dose-optimization program which includes automated exposure control, adjustment of the mA and/or kV according to patient size and/or use of iterative reconstruction technique. CONTRAST:  171m OMNIPAQUE IOHEXOL 300 MG/ML  SOLN COMPARISON:  MRI thoracic and lumbar spine 06/18/2021 FINDINGS: CT CHEST FINDINGS Cardiovascular: The heart size appears within normal limits. No pericardial effusion identified. Aortic atherosclerosis and coronary artery calcifications. Mediastinum/Nodes: Thyroid gland, trachea and esophagus are unremarkable. Subcarinal lymph node measures 1.5 cm, image 23/3. No enlarged axillary, supraclavicular, or hilar lymph nodes. Lungs/Pleura: No pleural effusion. Mild changes of emphysema. No suspicious pulmonary nodule or mass. Musculoskeletal: Multifocal lytic bone metastases identified involving the ribs, sternum and spine. Index lesions include: -expansile lesion with associated pathologic fracture involves the anterolateral aspect of the right second rib measuring 2.1 cm, image 19/5. -lesion involving the sternal manubrium measures 0.9 cm, 26/5.  -lesion involving the T11 vertebra measures 3.6 cm and extends into the right posterior elements, image 125/5. Associated pathologic compression fracture is noted with loss of approximately 25% of the vertebral body height. (See thoracic spine MRI for further details regarding the thoracic spine metastasis.) CT ABDOMEN PELVIS FINDINGS Hepatobiliary: Diffuse liver metastases are identified throughout both lobes of the liver. Lesions are too numerous to count. Index lesions include: -segment 2 lesion measuring 2.4 x 2.0 cm, image 44/3. -partially exophytic lesion involving segment 6 measures 2.1 x 1.7 cm, image 62/3. -segment 4B lesion measures 2.1 x 1.4 cm, image 5/3. The gallbladder appears surgically absent. Pancreas: Unremarkable. No pancreatic ductal dilatation or surrounding inflammatory changes. Spleen: Normal in size without focal abnormality. Adrenals/Urinary Tract: Adrenal glands are unremarkable. Kidneys are normal, without renal calculi, focal lesion, or hydronephrosis. Bladder is unremarkable. Stomach/Bowel: Stomach appears normal. No signs of small bowel wall thickening, inflammation, or distension. Circumferential mass is identified involving the mid to distal ascending colon measures 4.4 x 5.2 by 8.9 cm, image 67/3. There is significant luminal narrowing involving the affected segment without signs of bowel obstruction.  Transmural extension of tumor is identified with increased pericolonic soft tissue infiltration. Vascular/Lymphatic: Aortic atherosclerosis without aneurysm. Upper abdominal adenopathy is identified: -portacaval lymph node measures 2.3 cm short axis, image 53/3. -porta hepatic lymph node measures 2.3 cm, image 45/1. -ileocolic mesenteric lymph node measures 2.9 cm, image 70/3. No pelvic or inguinal adenopathy. Reproductive: Mild prostate gland enlargement. Other: No ascites. Peritoneal nodule within the left hemiabdomen is identified measuring 8 mm, image 70/3. Musculoskeletal:  Multifocal lytic bone metastases identified involving the lumbar spine and bony pelvis. Index lesions include: -L2 vertebral body lesion measures 2.7 cm, image 72/7. -left inferior pubic rami lesion measures 1.5 cm, image 130/3. -right acetabular lesion measures 0.9 cm, image 111/3. Bilateral proximal femur lesions are identified: -Lesion involving the lesser trochanter of the proximal left femur measures 1.7 cm, image 127/3. -intertrochanteric lesion involving the proximal right femur measures 2.3 cm, image 126/3. IMPRESSION: 1. 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 Transmural extension of tumor is identified with increased pericolonic soft tissue infiltration. There is significant luminal narrowing involving the affected segment of colon without signs of high-grade bowel obstruction. 2. Extensive liver metastases. 3. Upper abdominal and mesenteric adenopathy compatible with metastatic adenopathy. Enlarged subcarinal lymph node compatible with metastatic disease. Please see report from MRI of the thoracic and lumbar spine, also performed today. 4. There are lytic lesions involving bilateral proximal femurs which may be of orthopedic significance. 5. Coronary artery calcifications noted. 6. Aortic Atherosclerosis (ICD10-I70.0) and Emphysema (ICD10-J43.9). Electronically Signed   By: Kerby Moors M.D.   On: 06/18/2021 05:36   DG Chest Portable 1 View  Result Date: 06/17/2021 CLINICAL DATA:  Cough with leukocytosis. EXAM: PORTABLE CHEST 1 VIEW COMPARISON:  None Available. FINDINGS: Multiple overlying radiopaque cardiac lead wires are noted. The heart size and mediastinal contours are within normal limits. Both lungs are clear. An ill-defined lytic area is seen involving the lateral aspect of the second left rib. IMPRESSION: 1. No acute infiltrate or pleural effusion. 2. Ill-defined lytic area involving the lateral aspect of the second left rib. Further evaluation with nonemergent  chest CT is recommended. Electronically Signed   By: Virgina Norfolk M.D.   On: 06/17/2021 22:41        Scheduled Meds:  fentaNYL       gelatin adsorbable       lidocaine (PF)       midazolam       polyethylene glycol  17 g Oral BID   senna-docusate  2 tablet Oral BID   Continuous Infusions:     LOS: 1 day       Phillips Climes, MD [facility]   To contact the attending provider between 7A-7P or the covering provider during after hours 7P-7A, please log into the web site [email or web address] and access using universal [place] password for that web site. If you do not have the password, please call the hospital operator.  06/19/2021, 4:24 PM

## 2021-06-19 NOTE — Progress Notes (Signed)
  Inpatient Rehab Admissions Coordinator :  Per therapy recommendations patient was screened for CIR candidacy by Danne Baxter RN MSN. Patient medical workup ongoing as well as palliative goals of care discussions. I will await discussions to assist in determining if hospice care vs intensive therapy and return home if caregivers are available. The CIR admissions team will follow and monitor for progress and place a Rehab Consult order if felt to be appropriate. Please contact me with any questions.  Danne Baxter RN MSN Admissions Coordinator [phone]

## 2021-06-19 NOTE — Progress Notes (Signed)
Received call from nursing stating HR is sustained between 100-110 and patient seemed confused.  Upon exam patient is A/Ox4 and in NAD.  His skin is pale, warm and dry.  His liver biopsy site has a bandage and there is no current bleeding.  His belly is soft and non-tender to palpation.  Pulses are normal and his HR is 95.  He denies SOB, CP, dizziness, or nausea.  He denies abdominal pain.  He endorses continued back pain.  Shared with him the plan for KP/osteocool in the future.   He expresses feeling confused about the number of people involved in his care and number of different types of treatments being offered.  He would like to discuss this further with Dr.  [facility].    Electronically Signed: Pasty Spillers, PA-C 06/19/2021, 2:09 PM

## 2021-06-19 NOTE — Progress Notes (Signed)
Insurance inquiry revealed no authorization needed for osteocool/KP.  NIR de Sindy Messing available to perform intervention tomorrow morning (6/7) or Friday 6/8 at 10am.  Have made Mr. Mercado NPO tonight at midnight in case we can accommodate the procedure tomorrow, and a member of the team will be by in the morning to discuss with him and determine if he would like to proceed.  Electronically Signed: Pasty Spillers, PA-C 06/19/2021, 5:31 PM

## 2021-06-19 NOTE — Progress Notes (Signed)
Physical Therapy Treatment Patient Details Name: Raymond Henderson MRN: [id number] DOB: 1950-11-03 Today's Date: 06/19/2021   History of Present Illness Pt is a 71 y/o male admitted secondary to worsening back pain. 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 Found to have liver mets and suspected primary lesion in colon. Workup pending. No pertinent PMH.    PT Comments    Pt received supine and agreeable to session, however limited by pain despite premedication. Pt able to demonstrate good technique with log rolling and ability to come to sitting with min assist. Pt able to ambulate short distance from EOB to recliner and agreeable to time up in chair at end of session. Substantial time spent educating pt and friend Raymond Henderson on importance of continued mobility and activity recommendations with both verbalizing understanding and pt with good motivation and participation. Plan to continue to progress ambulation within pt tolerance next session. Pt continues to benefit from skilled PT services to progress toward functional mobility goals.     Recommendations for follow up therapy are one component of a multi-disciplinary discharge planning process, led by the attending physician.  Recommendations may be updated based on patient status, additional functional criteria and insurance authorization.  Follow Up Recommendations  Other (comment) (TBD)     Assistance Recommended at Discharge Frequent or constant Supervision/Assistance  Patient can return home with the following A little help with walking and/or transfers;A little help with bathing/dressing/bathroom;Assistance with cooking/housework;Help with stairs or ramp for entrance;Assist for transportation   Equipment Recommendations  Other (comment) (TBD)    Recommendations for Other Services       Precautions / Restrictions Precautions Precautions: Back;Other (comment) Precaution Booklet Issued: No Precaution  Comments: monitor HR Restrictions Weight Bearing Restrictions: No     Mobility  Bed Mobility Overal bed mobility: Needs Assistance Bed Mobility: Sidelying to Sit, Sit to Sidelying, Rolling Rolling: Modified independent (Device/Increase time) Sidelying to sit: Min assist       General bed mobility comments: guided in log rolling with pt able to grasp onto bedrail with Min A only to lift trunk with handheld assist.    Transfers Overall transfer level: Needs assistance Equipment used: 2 person hand held assist Transfers: Sit to/from Stand Sit to Stand: Min guard, From elevated surface           General transfer comment: able to stand from elevated surface with HHA of 2, able to come to sitting without assist    Ambulation/Gait Ambulation/Gait assistance: Min assist Gait Distance (Feet): 5 Feet Assistive device: 1 person hand held assist Gait Pattern/deviations: Step-to pattern, Antalgic, Trunk flexed Gait velocity: decr     General Gait Details: very slow guarded gait with HHA x1 from EOB to recliner, no overt LOB but general unsteadiness noted   Stairs             Wheelchair Mobility    Modified Rankin (Stroke Patients Only)       Balance Overall balance assessment: Needs assistance Sitting-balance support: No upper extremity supported, Feet supported Sitting balance-Leahy Scale: Fair     Standing balance support: Bilateral upper extremity supported, During functional activity, Reliant on assistive device for balance Standing balance-Leahy Scale: Poor Standing balance comment: reliant on BUE in standing for balance and to offload pressure                            Cognition Arousal/Alertness: Awake/alert Behavior During Therapy:  WFL for tasks assessed/performed Overall Cognitive Status: Within Functional Limits for tasks assessed                                 General Comments: hx of being "loopy" from pain medications.  appropriate throughout session, aware of seriousness of cancer        Exercises      General Comments General comments (skin integrity, edema, etc.): Raymond Henderson, pt friend present and very supportive throughout      Pertinent Vitals/Pain Pain Assessment Pain Assessment: Faces Faces Pain Scale: Hurts even more Pain Location: low back Pain Descriptors / Indicators: Grimacing, Guarding Pain Intervention(s): Monitored during session, Premedicated before session, Limited activity within patient's tolerance    Home Living Family/patient expects to be discharged to:: Private residence Living Arrangements: Alone Available Help at Discharge: Friend(s);Available PRN/intermittently Denman George, Bonnie) Type of Home: Apartment Home Access: Level entry       Home Layout: One level Home Equipment: Grab bars - tub/shower      Prior Function            PT Goals (current goals can now be found in the care plan section) Acute Rehab PT Goals Patient Stated Goal: to decrease pain PT Goal Formulation: With patient Time For Goal Achievement: 07/02/21    Frequency    Min 3X/week      PT Plan      Co-evaluation              AM-PAC PT "6 Clicks" Mobility   Outcome Measure  Help needed turning from your back to your side while in a flat bed without using bedrails?: A Little Help needed moving from lying on your back to sitting on the side of a flat bed without using bedrails?: A Little Help needed moving to and from a bed to a chair (including a wheelchair)?: A Lot Help needed standing up from a chair using your arms (e.g., wheelchair or bedside chair)?: A Lot Help needed to walk in hospital room?: A Lot Help needed climbing 3-5 steps with a railing? : Total 6 Click Score: 13    End of Session Equipment Utilized During Treatment: Gait belt Activity Tolerance: Patient limited by pain Patient left: in chair;with call bell/phone within reach Nurse Communication: Mobility  status PT Visit Diagnosis: Unsteadiness on feet (R26.81);Muscle weakness (generalized) (M62.81);Difficulty in walking, not elsewhere classified (R26.2);Pain Pain - part of body:  (back)     Time: 1610-9604 PT Time Calculation (min) (ACUTE ONLY): 23 min  Charges:  $Therapeutic Activity: 23-37 mins                     Antwann Preziosi R. PTA Acute Rehabilitation Services Office: [place] 06/19/2021, 3:02 PM

## 2021-06-19 NOTE — Progress Notes (Signed)
KP/osteocool procedure of T11, L1, is in review with insurance for authorization.   Electronically Signed: Pasty Spillers, PA-C 06/19/2021, 11:05 AM

## 2021-06-19 NOTE — Progress Notes (Signed)
OT Cancellation Note  Patient Details Name: Gracin Mcpartland MRN: [id number] DOB: 02/13/1950   Cancelled Treatment:    Reason Eval/Treat Not Completed: Fatigue/lethargy limiting ability to participate;Other (comment) Per RN, pt just got back from IR, was given fetanyl and lethargic at this time. RN recommends following up in PM as schedule permits for OT eval  Layla Maw 06/19/2021, 9:28 AM

## 2021-06-20 ENCOUNTER — Encounter (HOSPITAL_COMMUNITY): Payer: Self-pay | Admitting: Internal Medicine

## 2021-06-20 ENCOUNTER — Inpatient Hospital Stay (HOSPITAL_COMMUNITY): Payer: Medicare Other

## 2021-06-20 DIAGNOSIS — R748 Abnormal levels of other serum enzymes: Secondary | ICD-10-CM

## 2021-06-20 DIAGNOSIS — C799 Secondary malignant neoplasm of unspecified site: Secondary | ICD-10-CM | POA: Diagnosis not present

## 2021-06-20 DIAGNOSIS — K769 Liver disease, unspecified: Secondary | ICD-10-CM

## 2021-06-20 DIAGNOSIS — C189 Malignant neoplasm of colon, unspecified: Secondary | ICD-10-CM

## 2021-06-20 DIAGNOSIS — E871 Hypo-osmolality and hyponatremia: Secondary | ICD-10-CM

## 2021-06-20 DIAGNOSIS — K6389 Other specified diseases of intestine: Secondary | ICD-10-CM | POA: Diagnosis not present

## 2021-06-20 DIAGNOSIS — Z7189 Other specified counseling: Secondary | ICD-10-CM | POA: Diagnosis not present

## 2021-06-20 DIAGNOSIS — G929 Unspecified toxic encephalopathy: Secondary | ICD-10-CM

## 2021-06-20 HISTORY — PX: IR BONE TUMOR(S)RF ABLATION: IMG2284

## 2021-06-20 HISTORY — PX: IR KYPHO THORACIC WITH BONE BIOPSY: IMG5518

## 2021-06-20 HISTORY — PX: IR KYPHO EA ADDL LEVEL THORACIC OR LUMBAR: IMG5520

## 2021-06-20 LAB — COMPREHENSIVE METABOLIC PANEL
ALT: 38 U/L (ref 0–44)
AST: 59 U/L — ABNORMAL HIGH (ref 15–41)
Albumin: 2.5 g/dL — ABNORMAL LOW (ref 3.5–5.0)
Alkaline Phosphatase: 797 U/L — ABNORMAL HIGH (ref 38–126)
Anion gap: 12 (ref 5–15)
BUN: 17 mg/dL (ref 8–23)
CO2: 25 mmol/L (ref 22–32)
Calcium: 10.7 mg/dL — ABNORMAL HIGH (ref 8.9–10.3)
Chloride: 96 mmol/L — ABNORMAL LOW (ref 98–111)
Creatinine, Ser: 0.86 mg/dL (ref 0.61–1.24)
GFR, Estimated: >60 mL/min (ref >60)
Glucose, Bld: 137 mg/dL — ABNORMAL HIGH (ref 70–99)
Potassium: 3.9 mmol/L (ref 3.5–5.1)
Sodium: 133 mmol/L — ABNORMAL LOW (ref 135–145)
Total Bilirubin: 1.7 mg/dL — ABNORMAL HIGH (ref 0.3–1.2)
Total Protein: 6.6 g/dL (ref 6.5–8.1)

## 2021-06-20 LAB — CBC
HCT: 36.8 % — ABNORMAL LOW (ref 39.0–52.0)
Hemoglobin: 11.9 g/dL — ABNORMAL LOW (ref 13.0–17.0)
MCH: 26.6 pg (ref 26.0–34.0)
MCHC: 32.3 g/dL (ref 30.0–36.0)
MCV: 82.1 fL (ref 80.0–100.0)
Platelets: 332 10*3/uL (ref 150–400)
RBC: 4.48 MIL/uL (ref 4.22–5.81)
RDW: 18.4 % — ABNORMAL HIGH (ref 11.5–15.5)
WBC: 16.3 10*3/uL — ABNORMAL HIGH (ref 4.0–10.5)
nRBC: 0 % (ref 0.0–0.2)

## 2021-06-20 IMAGING — XA IR KYPHO VERTEBRAL THORACIC AUGMENTATION
2 of 3 series · 11 of 24 positions shown · non-contrast
Comparison: None Available.

INDICATION: FANFAN is a 70-year-old male with no significant past medical
history who presented to ED on [DATE] due to back pain. MRI of the
thoracic and lumbar spine showed widespread osseous metastatic
diease with pathologic fractures of T 11 and L1 with height loss,
corresponding to painful levels. Patient rates pain as 9-10/10. She
comes to our service for a fluoroscopy guided core bone biopsy,
radiofrequency ablation and balloon kyphoplasty.

EXAM:
FLUOROSCOPY GUIDED T11 AND L1 CORE BONE BIOPSY, RADIOFREQUENCY
ABLATION AND BALLOON KYPHOPLASTY
TECHNIQUE: Informed written consent was obtained from the patient after a
thorough discussion of the procedural risks, benefits and
alternatives. All questions were addressed.

[Series 12: single · 1 of 2 slices shown]
[im 1/2]
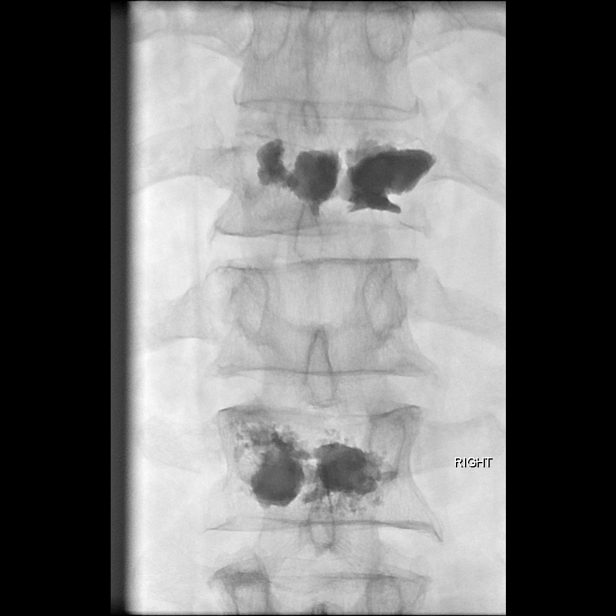

[Series 300: spine · 10 of 36 slices shown]
[im 2/36]
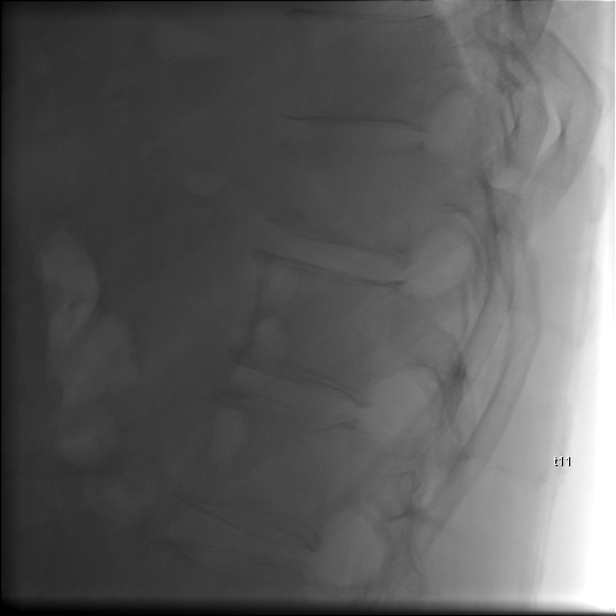
[im 6/36]
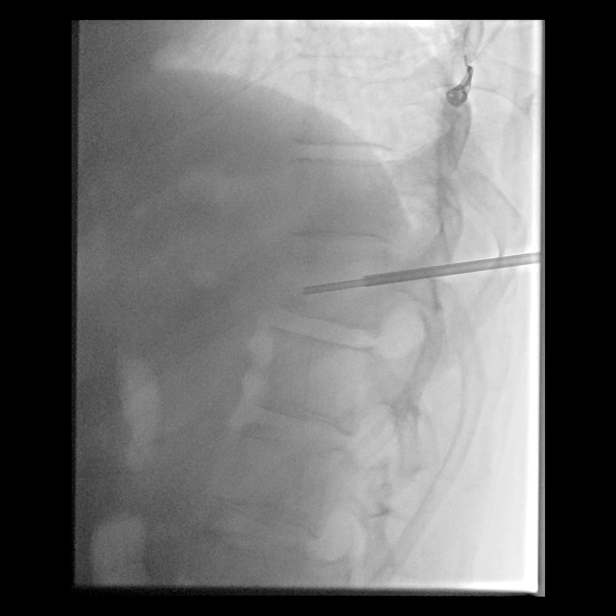
[im 9/36]
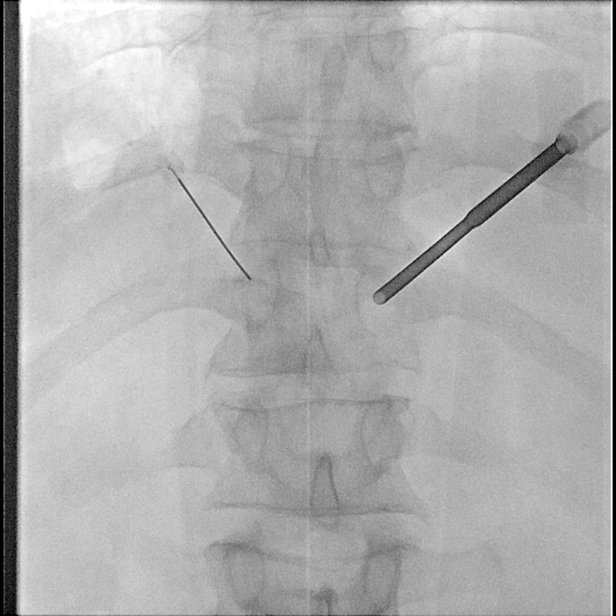
[im 12/36]
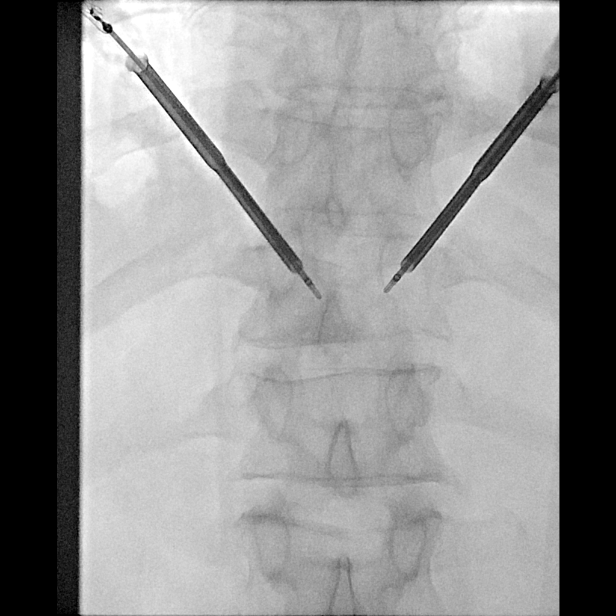
[im 17/36]
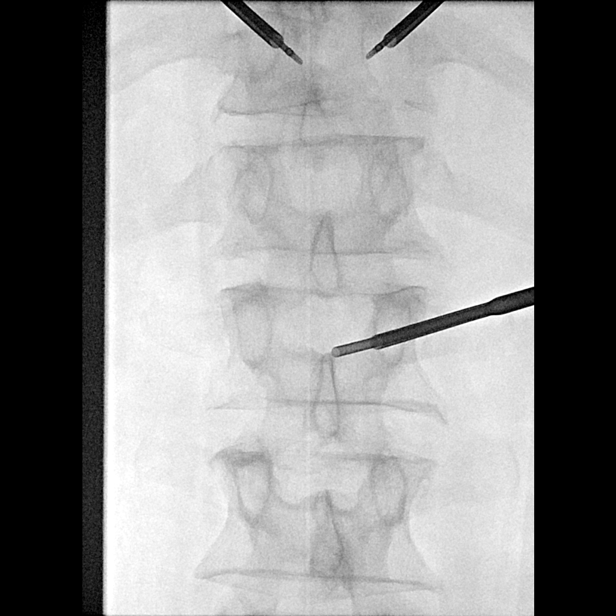
[im 21/36]
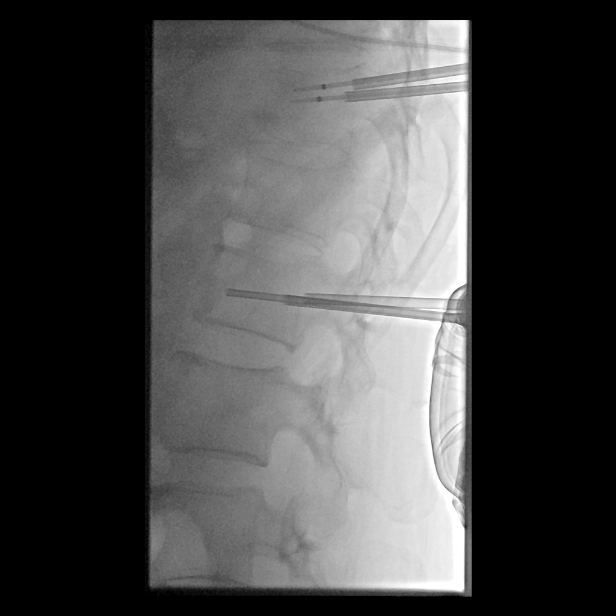
[im 24/36]
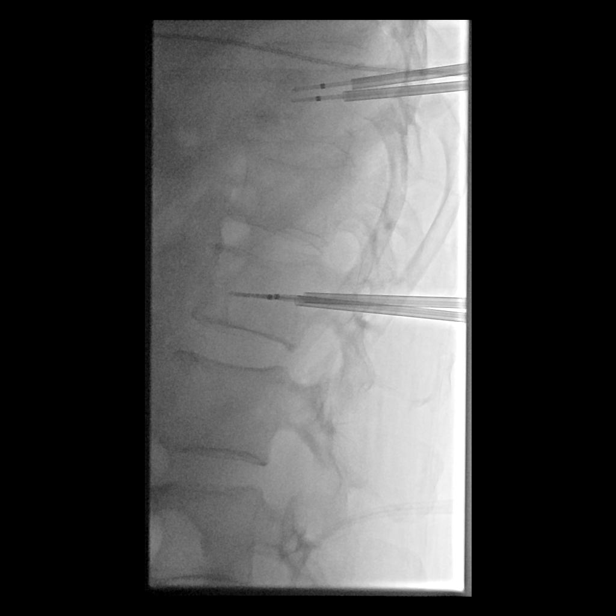
[im 27/36]
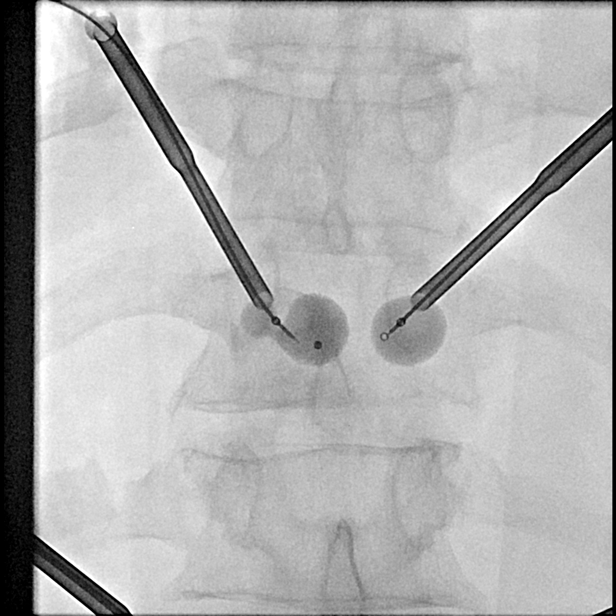
[im 31/36]
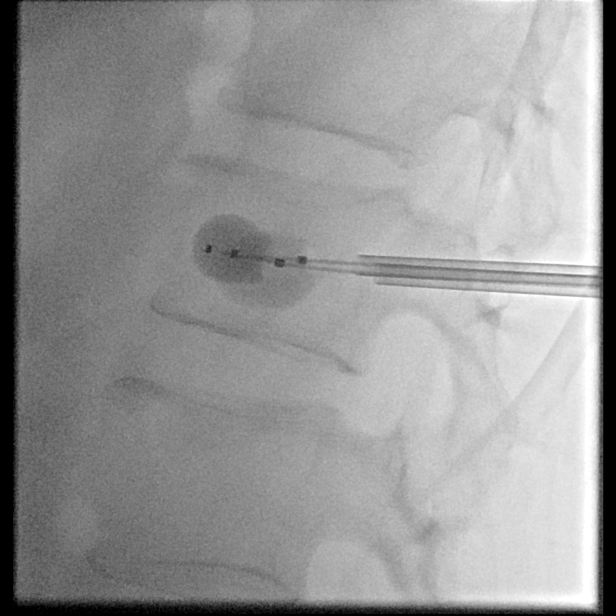
[im 34/36]
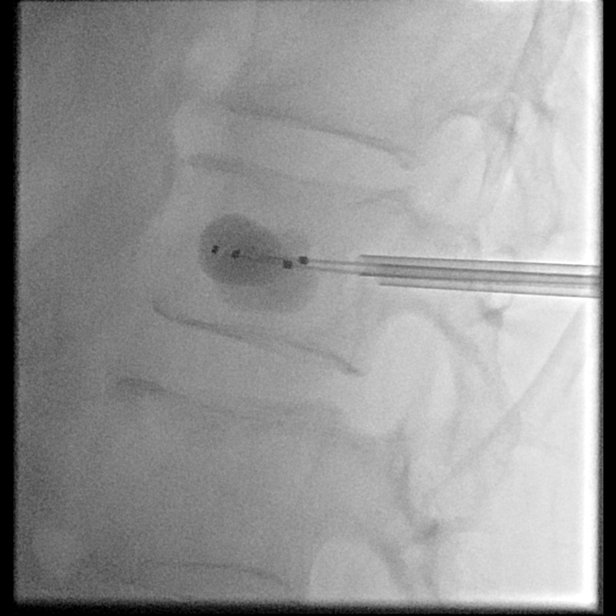

[11 of 24 positions shown; findings below may reference images not displayed]

MEDICATIONS:
Vancomycin 1 g IV; The antibiotic was administered in an appropriate
time interval prior to needle puncture of the skin.

ANESTHESIA/SEDATION:
A total of 4 mg of Versed and 200 mcg of fentanyl were administered
intravenously for moderate conscious sedation monitored under my
direct supervision. Total intraservice time of sedation was 87
minutes. The patient's vital signs were monitored throughout the
procedure and recorded in the patient's medical record by the nurse.

FLUOROSCOPY:
Radiation Exposure Index (as provided by the fluoroscopic device):
[3P] mGy Kerma PSD.

COMPLICATIONS:
None immediate.
Maximal Sterile Barrier Technique was utilized including caps, mask,
sterile gowns, sterile gloves, sterile drape, hand hygiene and skin
antiseptic. A timeout was performed prior to the initiation of the
procedure.

The patient was placed in prone position on the angiography table.
The thoracolumbar region was prepped and draped in a sterile
fashion.

Under fluoroscopy, the T11 vertebral body was delineated and the
skin area was marked. The skin was infiltrated with a 1% Lidocaine
approximately 3.3 cm lateral to the spinous process projection on
the right. Using a 22-gauge spinal needle, the soft issue and the
peripedicular space and periosteum were infiltrated with Bupivacaine
0.5%. A skin incision was made at the access site.

Subsequently, an 11-gauge Kyphon trocar was inserted under
fluoroscopic guidance until contact with the pedicle was obtained.
The trocar was inserted into the pedicle with light FANFAN
until the posterior boundaries of the vertebral body was reached.
The diamond mandrill was removed and one core biopsy was obtained.

The skin was infiltrated with a 1% Lidocaine approximately 3.2 cm
lateral to the spinous process projection on the left. Using a
22-gauge spinal needle, the soft issue and the peripedicular space
and periosteum were infiltrated with Bupivacaine 0.5%. A skin
incision was made at the access site. Subsequently, an 11-gauge
Kyphon trocar was inserted under fluoroscopic guidance until contact
with the pedicle was obtained. The trocar was inserted with light
FANFAN into the pedicle until the posterior boundaries of
the vertebral body was reached.

A bone drill was coaxially advanced within the anterior third of the
vertebral body on each side for proper RF probes size selection. An
OsteoCool RF probe was inserted bilaterally within the mid-posterior
third of the vertebral body. The radiofrequency ablation cycle was
performed.

Thereafter, the RF probes were exchanged for inflatable Kyphon
balloons, which were centered within the mid-aspect of the vertebral
body. The balloons were inflated to create a void to serve as a
repository for the bone cement.

Both balloons were deflated and through both cannulas, under
continuous fluoroscopy guidance in the AP and lateral views, the
vertebral body was filled with previously mixed
polymethyl-methacrylate (PMMA) added to barium for opacification.
Both cannulas were later removed.

Next, the L1 vertebral body was delineated under fluoroscopy and the
skin area was marked. The skin was infiltrated with a 1% Lidocaine
approximately 4.5 cm lateral to the spinous process projection on
the right. Using a 22-gauge spinal needle, the soft issue and the
peripedicular space and periosteum were infiltrated with Bupivacaine
0.5%. A skin incision was made at the access site.

Subsequently, an 11-gauge Kyphon trocar was inserted under
fluoroscopic guidance until contact with the pedicle was obtained.
The trocar was inserted into the pedicle with light FANFAN
until the posterior boundaries of the vertebral body was reached.
The diamond mandrill was removed and one core biopsy was obtained.

The skin was infiltrated with a 1% Lidocaine approximately 4.5 cm
lateral to the spinous process projection on the left. Using a
22-gauge spinal needle, the soft issue and the peripedicular space
and periosteum were infiltrated with Bupivacaine 0.5%. A skin
incision was made at the access site. Subsequently, an 11-gauge
Kyphon trocar was inserted under fluoroscopic guidance until contact
with the pedicle was obtained. The trocar was inserted with light
FANFAN into the pedicle until the posterior boundaries of
the vertebral body was reached.

A bone drill was coaxially advanced within the anterior third of the
vertebral body on each side for proper RF probes size selection. An
OsteoCool RF probe was inserted bilaterally within the mid-posterior
third of the vertebral body. The radiofrequency ablation cycle was
performed.

Thereafter, the RF probes were exchanged for inflatable Kyphon
balloons, which were centered within the mid-aspect of the vertebral
body. The balloons were inflated to create a void to serve as a
repository for the bone cement.

Both balloons were deflated and through both cannulas, under
continuous fluoroscopy guidance in the AP and lateral views, the
vertebral body was filled with previously mixed
polymethyl-methacrylate (PMMA) added to barium for opacification.
Both cannulas were later removed.

The skin was cleaned and access sites were covered with sterile
bandages.
FINDINGS: Pathologic fractures of the T11 and L1 vertebral bodies, as seen on
prior cross-sectional imaging.
IMPRESSION: Successful and uncomplicated core bone biopsy of the T11 and L1
vertebral bodies followed by radiofrequency ablation and balloon
kyphoplasty for treatment of symptomatic pathologic fractures.

## 2021-06-20 MED ORDER — HYDROMORPHONE HCL 1 MG/ML IJ SOLN
1.0000 mg | INTRAMUSCULAR | Status: DC | PRN
  Administered 2021-06-21 – 2021-06-26 (×5): 1 mg via INTRAVENOUS
  Filled 2021-06-20 (×5): qty 1

## 2021-06-20 MED ORDER — KETOROLAC TROMETHAMINE 30 MG/ML IJ SOLN
INTRAMUSCULAR | Status: AC | PRN
  Administered 2021-06-20: 30 mg via INTRAVENOUS

## 2021-06-20 MED ORDER — LIDOCAINE HCL 1 % IJ SOLN
INTRAMUSCULAR | Status: AC
  Filled 2021-06-20: qty 20

## 2021-06-20 MED ORDER — MIDAZOLAM HCL 2 MG/2ML IJ SOLN
INTRAMUSCULAR | Status: AC
  Filled 2021-06-20: qty 2

## 2021-06-20 MED ORDER — BUPIVACAINE HCL (PF) 0.5 % IJ SOLN
INTRAMUSCULAR | Status: AC | PRN
  Administered 2021-06-20: 30 mL

## 2021-06-20 MED ORDER — LIDOCAINE HCL (PF) 1 % IJ SOLN
INTRAMUSCULAR | Status: AC | PRN
  Administered 2021-06-20: 20 mL

## 2021-06-20 MED ORDER — HEPARIN SODIUM (PORCINE) 5000 UNIT/ML IJ SOLN
5000.0000 [IU] | Freq: Three times a day (TID) | INTRAMUSCULAR | Status: DC
  Administered 2021-06-20 – 2021-06-29 (×26): 5000 [IU] via SUBCUTANEOUS
  Filled 2021-06-20 (×26): qty 1

## 2021-06-20 MED ORDER — MIDAZOLAM HCL 2 MG/2ML IJ SOLN
INTRAMUSCULAR | Status: AC
  Filled 2021-06-20: qty 4

## 2021-06-20 MED ORDER — BUPIVACAINE HCL (PF) 0.5 % IJ SOLN
INTRAMUSCULAR | Status: AC
  Filled 2021-06-20: qty 30

## 2021-06-20 MED ORDER — VANCOMYCIN HCL IN DEXTROSE 1-5 GM/200ML-% IV SOLN
INTRAVENOUS | Status: AC
  Administered 2021-06-20: 1000 mg via INTRAVENOUS
  Filled 2021-06-20: qty 200

## 2021-06-20 MED ORDER — OXYCODONE HCL 5 MG PO TABS
10.0000 mg | ORAL_TABLET | ORAL | Status: DC | PRN
  Administered 2021-06-21 – 2021-06-29 (×25): 10 mg via ORAL
  Filled 2021-06-20 (×26): qty 2

## 2021-06-20 MED ORDER — FENTANYL 25 MCG/HR TD PT72
1.0000 | MEDICATED_PATCH | TRANSDERMAL | Status: DC
  Administered 2021-06-20 – 2021-06-29 (×4): 1 via TRANSDERMAL
  Filled 2021-06-20 (×4): qty 1

## 2021-06-20 MED ORDER — KETOROLAC TROMETHAMINE 30 MG/ML IJ SOLN
INTRAMUSCULAR | Status: AC
  Filled 2021-06-20: qty 1

## 2021-06-20 MED ORDER — FENTANYL CITRATE (PF) 100 MCG/2ML IJ SOLN
INTRAMUSCULAR | Status: AC
  Filled 2021-06-20: qty 4

## 2021-06-20 MED ORDER — FENTANYL CITRATE (PF) 100 MCG/2ML IJ SOLN
INTRAMUSCULAR | Status: AC
  Filled 2021-06-20: qty 2

## 2021-06-20 MED ORDER — FENTANYL CITRATE (PF) 100 MCG/2ML IJ SOLN
INTRAMUSCULAR | Status: AC | PRN
  Administered 2021-06-20: 50 ug via INTRAVENOUS
  Administered 2021-06-20 (×6): 25 ug via INTRAVENOUS

## 2021-06-20 MED ORDER — MIDAZOLAM HCL 2 MG/2ML IJ SOLN
INTRAMUSCULAR | Status: AC | PRN
  Administered 2021-06-20 (×3): .5 mg via INTRAVENOUS
  Administered 2021-06-20: 1 mg via INTRAVENOUS
  Administered 2021-06-20 (×3): .5 mg via INTRAVENOUS

## 2021-06-20 MED ORDER — VANCOMYCIN HCL IN DEXTROSE 1-5 GM/200ML-% IV SOLN: 1000.0000 mg | INTRAVENOUS | Status: AC

## 2021-06-20 MED ORDER — FLUMAZENIL 0.5 MG/5ML IV SOLN
INTRAVENOUS | Status: AC
  Filled 2021-06-20: qty 5

## 2021-06-20 MED ORDER — NALOXONE HCL 0.4 MG/ML IJ SOLN
INTRAMUSCULAR | Status: AC
  Filled 2021-06-20: qty 1

## 2021-06-20 NOTE — Procedures (Signed)
INTERVENTIONAL NEURORADIOLOGY BRIEF POSTPROCEDURE NOTE  FLUOROSCOPY GUIDED T11 AND L1 CORE BONE BIOPSY, RFA AND KYPHOPLASTY  Attending: Dr. Pedro Earls  Assistant: None.  Diagnosis: Pathologic fractures at T11 and L1  Access site: Percutaneous bilateral transpedicular  Anesthesia: Moderate sedation  Medication used: 4 mg Versed IV; 200 mcg Fentanyl IV, 30 mg Toradol.  Complications: None.  Estimated blood loss: Minimal.  Specimen: T11 and L1 core biopsies sent for tissue exam.  Findings: Pathologic fractures at T11 and L1. Bilateral transpedicular approach utilized for core bone biopsy, RFA and kyphoplasty.   The patient tolerated the procedure well without incident or complication and is in stable condition.

## 2021-06-20 NOTE — Progress Notes (Signed)
I think it is interesting that his CEA is only 5.8.  I think that its this was colon cancer, we be looking at a much higher CEA level given the extent of his disease.  He had a liver biopsy yesterday.  I still think he needs to have a colonoscopy to evaluate this mass in the right colon.  He, at first, was going to deny the kyphoplasty.  I talked him at length about this.  He is really troubled by the fact that his bed is so uncomfortable.  He really needs to have an air mattress bed to try to help take pressure off his back.  I am not sure what his pain regimen is.  We may have to make some adjustments with this.  He has been seen by Radiation Oncology.  Again he is quite ambivalent as to whether or not he really wants any treatment.  I really tried to talk to him that our goal is that we want to have him have a much better quality of life and we can get his pain under better control, he will have a better quality of life.  He has not been able to eat much because of his procedures.  I am not sure when the liver biopsy will come back.  Again this will be very instructive.  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  His labs are really not that remarkable.  He does have a markedly elevated alkaline phosphatase.  His albumin is only 2.5.  His saturation is only 15%.  I think we gave him some IV iron.  He has gotten Zometa.  Again, without a tissue diagnosis, it is hard to make any recommendations.  Hopefully, we will be able to know what is going on in a day or so.  Again, he really needs to have an air mattress bed to help with his back.  I know that he is going to get incredible care from all the staff [facility] [address]  I appreciate all of their compassion.  Lattie Haw, MD  Hebrews 12:12

## 2021-06-20 NOTE — Sedation Documentation (Signed)
Patient forehead became diaphoretic. Cold wash cloth applied to forehead and bilateral hands. Skin now dry.

## 2021-06-20 NOTE — Consult Note (Addendum)
Chief Complaint: Patient was seen in consultation today for image guided KP Chief Complaint  Patient presents with   Fall   Back Pain   at the request of Burney Gauze  Referring Physician(s): Burney Gauze  Supervising Physician: Pedro Earls  Patient Status: [facility] - In-pt  History of Present Illness: Raymond Henderson is a 71 y.o. male with no past medical hx who presented to [facility] ED on 06/17/21 due to back pain.   Patient underwent MR of T and L spine, which unfortunately showed widespread osseous metastatic diease as well as pathologic fx of T 11 and L1 with height loss.  Patient was admitted for further evaluation and underwent CT CAP with on 06/18/21 which showed:   1. 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 Transmural extension of tumor is identified with increased pericolonic soft tissue infiltration. There is significant luminal narrowing involving the affected segment of colon without signs of high-grade bowel obstruction. 2. Extensive liver metastases. 3. Upper abdominal and mesenteric adenopathy compatible with metastatic adenopathy. Enlarged subcarinal lymph node compatible with metastatic disease. Please see report from MRI of the thoracic and lumbar spine, also performed today. 4. There are lytic lesions involving bilateral proximal femurs which may be of orthopedic significance. 5. Coronary artery calcifications noted. 6. Aortic Atherosclerosis (ICD10-I70.0) and Emphysema (ICD10-J43.9).   Patient underwent liver lesion bx with IR on 06/18/21, pathology is pending.  NIR was requested for KP for the acute, symptomatic pathologic fx.  Case was reviewed and approved for image guided T 11 and L1 bone lesion bx, ablation, and kyphoplasty by Dr. Karenann Cai.   Patient laying in bed, not in acute distress.  States that he feels terrible this morning as he has not been able to eat/drink.  Also reports persistent mid-lower  back pain this morning ranging from 7 to 9 and worse with movement, the back pain has been making it difficult for him to move/ambulate.  Denise headache, fever, chills, shortness of breath, cough, chest pain, abdominal pain, nausea ,vomiting, and bleeding.   Past Medical History:  Diagnosis Date   No pertinent past medical history     History reviewed. No pertinent surgical history.  Allergies: Penicillins  Medications: Prior to Admission medications   Medication Sig Start Date End Date Taking? Authorizing Provider  acetaminophen (TYLENOL) 650 MG CR tablet Take 1,300 mg by mouth every 8 (eight) hours.   Yes [provider]  benzonatate (TESSALON) 200 MG capsule Take 200 mg by mouth 3 (three) times daily as needed for cough. 06/05/21  Yes [provider]  cyclobenzaprine (FLEXERIL) 5 MG tablet Take 5 mg by mouth 3 (three) times daily as needed for muscle spasms. 06/15/21  Yes [provider]  gabapentin (NEURONTIN) 300 MG capsule Take 300 mg by mouth every 8 (eight) hours. 06/15/21  Yes [provider]  metroNIDAZOLE (FLAGYL) 500 MG tablet Take 500 mg by mouth 3 (three) times daily. Patient not taking: Reported on 06/18/2021 06/15/21   [provider]     Family History  Problem Relation Age of Onset   Heart disease Neg Hx     Social History   Socioeconomic History   Marital status: Married    Spouse name: Not on file   Number of children: Not on file   Years of education: Not on file   Highest education level: Not on file  Occupational History   Not on file  Tobacco Use   Smoking status: Never  Smokeless tobacco: Never  Substance and Sexual Activity   Alcohol use: Never   Drug use: Never   Sexual activity: Not on file  Other Topics Concern   Not on file  Social History Narrative   Not on file   Social Determinants of Health   Financial Resource Strain: Not on file  Food Insecurity: Not on file  Transportation Needs: Not on  file  Physical Activity: Not on file  Stress: Not on file  Social Connections: Not on file     Review of Systems: A 12 point ROS discussed and pertinent positives are indicated in the HPI above.  All other systems are negative.  Vital Signs: BP (!) 149/84 (BP Location: Left Arm)   Pulse 99   Temp 98.7 F (37.1 C) (Oral)   Resp 17   Ht '5\' 9"'$  (1.753 m)   Wt 184 lb (83.5 kg)   SpO2 95%   BMI 27.17 kg/m    Physical Exam Vitals reviewed.  Constitutional:      General: He is not in acute distress.    Appearance: Normal appearance. He is not ill-appearing.  HENT:     Head: Normocephalic.     Mouth/Throat:     Mouth: Mucous membranes are dry.     Pharynx: Oropharynx is clear.  Cardiovascular:     Rate and Rhythm: Regular rhythm. Tachycardia present.     Heart sounds: Normal heart sounds.  Pulmonary:     Effort: Pulmonary effort is normal.     Breath sounds: Normal breath sounds.  Abdominal:     General: Abdomen is flat. Bowel sounds are normal.     Palpations: Abdomen is soft.  Musculoskeletal:        General: Tenderness present.     Cervical back: Neck supple.     Comments: TTP to mid-to lower back   Skin:    General: Skin is warm and dry.     Coloration: Skin is not jaundiced or pale.  Neurological:     Mental Status: He is alert and oriented to person, place, and time.  Psychiatric:        Mood and Affect: Mood normal.        Behavior: Behavior normal.        Judgment: Judgment normal.    MD Evaluation Airway: WNL Heart: WNL Abdomen: WNL Chest/ Lungs: WNL ASA  Classification: 3 Mallampati/Airway Score: Two  Imaging: MR THORACIC SPINE W WO CONTRAST  Result Date: 06/18/2021 CLINICAL DATA:  Initial evaluation for acute back pain, trauma. EXAM: MRI THORACIC WITHOUT AND WITH CONTRAST TECHNIQUE: Multiplanar and multiecho pulse sequences of the thoracic spine were obtained without and with intravenous contrast. CONTRAST:  8.75m GADAVIST GADOBUTROL 1 MMOL/ML IV  SOLN COMPARISON:  None Available. FINDINGS: Alignment: Physiologic with preservation of the normal thoracic kyphosis. Vertebrae: There are innumerable T1 hypointense, stir hyperintense enhancing lesions seen throughout the thoracic spine, consistent with widespread osseous metastatic disease. There is involvement of essentially all levels. Posterior elements are involved at numerous levels as well. Associated pathologic fracture at T11 with up to 35% height loss. Evidence for early epidural extension into the ventral epidural space at T7, T9, and T11. Prominent extra osseous extension with soft tissue component seen about the left rib and transverse process of T7, measuring approximately 3.3 x 2.4 cm (series 20, image 20). Multiple lesion seen involving the partially visualized posterior ribs as well. No significant or high-grade spinal stenosis at this time. Cord: Early epidural extension at  the levels of T7, T9, and T11. No high-grade spinal stenosis or frank cord impingement. Cord itself is normal in caliber in appearance. No abnormal intramedullary enhancement. Paraspinal and other soft tissues: Extraosseous extension with associated soft tissue component about the left transverse process of T7 as above. More mild extraosseous extension also noted on the right at T4 and on the left at T9. Innumerable lesions seen throughout the partially visualized liver, likely reflecting metastatic disease. Disc levels: No significant underlying disc pathology. No high-grade spinal stenosis. Mild foraminal narrowing on the right at T6-7 and T11-12 related to extraosseous extension of tumor. Foramina otherwise remain patent at this time. IMPRESSION: 1. Widespread osseous metastatic disease throughout the thoracic spine as above. 2. Associated pathologic fracture at T11 with up to 35% height loss. 3. Evidence for early epidural extension at the levels of T7, T9, and T11. No high-grade spinal stenosis or frank cord impingement at  this time. 4. Innumerable lesions throughout the partially visualized liver, likely reflecting metastatic disease. Electronically Signed   By: Jeannine Boga M.D.   On: 06/18/2021 01:07   MR Lumbar Spine W Wo Contrast  Result Date: 06/18/2021 CLINICAL DATA:  Initial evaluation for trauma, severe back pain. EXAM: MRI LUMBAR SPINE WITHOUT AND WITH CONTRAST TECHNIQUE: Multiplanar and multiecho pulse sequences of the lumbar spine were obtained without and with intravenous contrast. CONTRAST:  8.20m GADAVIST GADOBUTROL 1 MMOL/ML IV SOLN COMPARISON:  None Available. FINDINGS: Segmentation: Standard. Lowest well-formed disc space labeled the L5-S1 level. Alignment: Physiologic with preservation of the normal lumbar lordosis. No listhesis. Vertebrae: Widespread osseous metastatic disease seen throughout the lumbar spine as well as the visualized sacrum and pelvis. There is involvement of essentially all levels. Scattered posterior element involvement seen at numerable levels as well. Associated pathologic fracture at L1 with mild 20% height loss. Evidence for early epidural extension of tumor into the ventral epidural space at L1 and L5 (series 27, images 9, 7). Conus medullaris and cauda equina: Conus extends to the L1 level. Conus and cauda equina appear normal. No abnormal intramedullary enhancement. Paraspinal and other soft tissues: Mild extraosseous extension of tumor with involvement of the adjacent paraspinous soft tissues noted at multiple levels, most notable at the right transverse process of L3 (series 32, image 19). Scattered edema within the lower posterior paraspinous musculature likely reactive. Enlarged aortocaval lymph nodes measuring up to 1.5 cm in short axis, concerning for nodal metastatic disease (series 29, images 5, 10). Disc levels: T12-L1: Pathologic compression fracture of L1 with mild epidural extension of tumor. Secondary flattening of the ventral thecal sac, eccentric to the left. No  significant spinal stenosis at this time. Foramina remain patent. L1-2:  Unremarkable. L2-3: Small left extraforaminal disc protrusion with annular fissure. Mild facet hypertrophy. No stenosis. L3-4: Disc desiccation with mild disc bulge. Mild facet hypertrophy. No stenosis. L4-5: Disc desiccation with mild disc bulge. Mild to moderate facet hypertrophy. No significant stenosis. L5-S1: Degenerative intervertebral disc space narrowing with diffuse disc bulge and reactive endplate change. Epidural extension of tumor into the right ventral epidural space (series 29, image 33). Mild to moderate bilateral facet hypertrophy. Mild narrowing of the right lateral recess without significant spinal stenosis. Mild left L5 foraminal narrowing. Right neural foramen remains patent. IMPRESSION: 1. Widespread osseous metastatic disease throughout the lumbar spine as well as the visualized sacrum and pelvis. 2. Associated pathologic fracture of L1 with mild 20% height loss. Evidence for early epidural extension of tumor into the ventral epidural space at  L1 and L5. No significant spinal stenosis at this time. 3. Enlarged aortocaval lymph nodes, concerning for nodal metastatic disease. 4. Underlying mild multilevel degenerative spondylosis as above, most pronounced at L5-S1. Electronically Signed   By: Jeannine Boga M.D.   On: 06/18/2021 01:16   CT CHEST ABDOMEN PELVIS W CONTRAST  Result Date: 06/18/2021 CLINICAL DATA:  Metastatic disease.  Staging. EXAM: CT CHEST, ABDOMEN, AND PELVIS WITH CONTRAST TECHNIQUE: Multidetector CT imaging of the chest, abdomen and pelvis was performed following the standard protocol during bolus administration of intravenous contrast. RADIATION DOSE REDUCTION: This exam was performed according to the departmental dose-optimization program which includes automated exposure control, adjustment of the mA and/or kV according to patient size and/or use of iterative reconstruction technique. CONTRAST:   162m OMNIPAQUE IOHEXOL 300 MG/ML  SOLN COMPARISON:  MRI thoracic and lumbar spine 06/18/2021 FINDINGS: CT CHEST FINDINGS Cardiovascular: The heart size appears within normal limits. No pericardial effusion identified. Aortic atherosclerosis and coronary artery calcifications. Mediastinum/Nodes: Thyroid gland, trachea and esophagus are unremarkable. Subcarinal lymph node measures 1.5 cm, image 23/3. No enlarged axillary, supraclavicular, or hilar lymph nodes. Lungs/Pleura: No pleural effusion. Mild changes of emphysema. No suspicious pulmonary nodule or mass. Musculoskeletal: Multifocal lytic bone metastases identified involving the ribs, sternum and spine. Index lesions include: -expansile lesion with associated pathologic fracture involves the anterolateral aspect of the right second rib measuring 2.1 cm, image 19/5. -lesion involving the sternal manubrium measures 0.9 cm, 26/5. -lesion involving the T11 vertebra measures 3.6 cm and extends into the right posterior elements, image 125/5. Associated pathologic compression fracture is noted with loss of approximately 25% of the vertebral body height. (See thoracic spine MRI for further details regarding the thoracic spine metastasis.) CT ABDOMEN PELVIS FINDINGS Hepatobiliary: Diffuse liver metastases are identified throughout both lobes of the liver. Lesions are too numerous to count. Index lesions include: -segment 2 lesion measuring 2.4 x 2.0 cm, image 44/3. -partially exophytic lesion involving segment 6 measures 2.1 x 1.7 cm, image 62/3. -segment 4B lesion measures 2.1 x 1.4 cm, image 5/3. The gallbladder appears surgically absent. Pancreas: Unremarkable. No pancreatic ductal dilatation or surrounding inflammatory changes. Spleen: Normal in size without focal abnormality. Adrenals/Urinary Tract: Adrenal glands are unremarkable. Kidneys are normal, without renal calculi, focal lesion, or hydronephrosis. Bladder is unremarkable. Stomach/Bowel: Stomach appears  normal. No signs of small bowel wall thickening, inflammation, or distension. Circumferential mass is identified involving the mid to distal ascending colon measures 4.4 x 5.2 by 8.9 cm, image 67/3. There is significant luminal narrowing involving the affected segment without signs of bowel obstruction. Transmural extension of tumor is identified with increased pericolonic soft tissue infiltration. Vascular/Lymphatic: Aortic atherosclerosis without aneurysm. Upper abdominal adenopathy is identified: -portacaval lymph node measures 2.3 cm short axis, image 53/3. -porta hepatic lymph node measures 2.3 cm, image 589/2 -ileocolic mesenteric lymph node measures 2.9 cm, image 70/3. No pelvic or inguinal adenopathy. Reproductive: Mild prostate gland enlargement. Other: No ascites. Peritoneal nodule within the left hemiabdomen is identified measuring 8 mm, image 70/3. Musculoskeletal: Multifocal lytic bone metastases identified involving the lumbar spine and bony pelvis. Index lesions include: -L2 vertebral body lesion measures 2.7 cm, image 72/7. -left inferior pubic rami lesion measures 1.5 cm, image 130/3. -right acetabular lesion measures 0.9 cm, image 111/3. Bilateral proximal femur lesions are identified: -Lesion involving the lesser trochanter of the proximal left femur measures 1.7 cm, image 127/3. -intertrochanteric lesion involving the proximal right femur measures 2.3 cm, image 126/3. IMPRESSION: 1. Imaging findings  compatible with primary ascending colon carcinoma. Transmural extension of tumor is identified with increased pericolonic soft tissue infiltration. There is significant luminal narrowing involving the affected segment of colon without signs of high-grade bowel obstruction. 2. Extensive liver metastases. 3. Upper abdominal and mesenteric adenopathy compatible with metastatic adenopathy. Enlarged subcarinal lymph node compatible with metastatic disease. Please see report from MRI of the thoracic and  lumbar spine, also performed today. 4. There are lytic lesions involving bilateral proximal femurs which may be of orthopedic significance. 5. Coronary artery calcifications noted. 6. Aortic Atherosclerosis (ICD10-I70.0) and Emphysema (ICD10-J43.9). Electronically Signed   By: Kerby Moors M.D.   On: 06/18/2021 05:36   US BIOPSY (LIVER)  Result Date: 06/19/2021 INDICATION: 71 year old male with history of ascending colonic mass and multifocal liver masses, presumed metastases. EXAM: ULTRASOUND GUIDED LIVER LESION BIOPSY COMPARISON:  None Available. MEDICATIONS: None ANESTHESIA/SEDATION: Fentanyl 25 mcg IV; Versed 0.5 mg IV Total Moderate Sedation time:  10 minutes. The patient's level of consciousness and vital signs were monitored continuously by radiology nursing throughout the procedure under my direct supervision. COMPLICATIONS: None immediate. PROCEDURE: Informed written consent was obtained from the patient after a discussion of the risks, benefits and alternatives to treatment. The patient understands and consents the procedure. A timeout was performed prior to the initiation of the procedure. Ultrasound scanning was performed of the right upper abdominal quadrant demonstrates hypoechoic mass in the left lobe of the liver with anterior hepatic capsular bulging. Left lobe liver mass was selected for biopsy and the procedure was planned. The right upper abdominal quadrant was prepped and draped in the usual sterile fashion. The overlying soft tissues were anesthetized with 1% lidocaine with epinephrine. A 17 gauge, 6.8 cm co-axial needle was advanced into a peripheral aspect of the lesion. This was followed by 3 core biopsies with an 18 gauge core device under direct ultrasound guidance. The coaxial needle tract was embolized with a small amount of Gel-Foam slurry and superficial hemostasis was obtained with manual compression. Post procedural scanning was negative for definitive area of  hemorrhage or additional complication. A dressing was placed. The patient tolerated the procedure well without immediate post procedural complication. IMPRESSION: Technically successful ultrasound guided core needle biopsy of left lobe liver mass. Ruthann Cancer, MD [facility] Electronically Signed   By: Ruthann Cancer M.D.   On: 06/19/2021 17:28   DG Chest Portable 1 View  Result Date: 06/17/2021 CLINICAL DATA:  Cough with leukocytosis. EXAM: PORTABLE CHEST 1 VIEW COMPARISON:  None Available. FINDINGS: Multiple overlying radiopaque cardiac lead wires are noted. The heart size and mediastinal contours are within normal limits. Both lungs are clear. An ill-defined lytic area is seen involving the lateral aspect of the second left rib. IMPRESSION: 1. No acute infiltrate or pleural effusion. 2. Ill-defined lytic area involving the lateral aspect of the second left rib. Further evaluation with nonemergent chest CT is recommended. Electronically Signed   By: Virgina Norfolk M.D.   On: 06/17/2021 22:41    Labs:  CBC: Recent Labs    06/17/21 2138 06/18/21 0810 06/19/21 0232 06/20/21 0359  WBC 17.0* 14.0* 15.0* 16.3*  HGB 11.4* 11.4* 12.4* 11.9*  HCT 36.4* 36.1* 37.7* 36.8*  PLT 346 326 239 332    COAGS: Recent Labs    06/18/21 0509  INR 1.1  APTT 33    BMP: Recent Labs    06/17/21 2138 06/18/21 0810 06/19/21 0443 06/20/21 0542  NA 132* 135  134* 133*  K 4.0 4.1 4.5 3.9  CL 96* 98 99 96*  CO2 '25 27 27 25  '$ GLUCOSE 110* 106* 123* 137*  BUN 24* '19 20 17  '$ CALCIUM 10.5* 10.8* 11.7* 10.7*  CREATININE 1.01 0.87 0.95 0.86  GFRNONAA >60 >60 >60 >60    LIVER FUNCTION TESTS: Recent Labs    06/18/21 0509 06/18/21 0810 06/19/21 0443 06/20/21 0542  BILITOT 2.0* 1.8* 2.1* 1.7*  AST 73* 68* 63* 59*  ALT 46* 46* 40 38  ALKPHOS 725* 759* 792* 797*  PROT 6.7 6.5 6.6 6.6  ALBUMIN 2.5* 2.4* 2.5* 2.5*    TUMOR MARKERS: No  results for input(s): AFPTM, CEA, CA199, CHROMGRNA in the last 8760 hours.  Assessment and Plan: 71 y.o. male with metastatic osseous lesions as well as symptomatic pathologic FX with height loss of T 11 and L1.   NIR was requested for KP, case reviewed and approved for bone lesion bx,  ablation and kyphoplasty of T 11 and L1 by Dr. Katherina Right Rodirgues.  Insurance approval submitted and NIR was notified that no authorization is required.   The procedure is tentatively scheduled for today pending NIR schedule.   NPO since MN VSS Persistent leukocytosis during this admission, blood cx obtained on 06/18/21 which has shown no growth for 1 day. Spoke with microbiology department, confirmed that blood cx showed no growth x 48 hours today.  Also discussed with attending provider Dr. Cyndia Skeeters, no concern for acute infection and leukocytosis most likely reactive.  INR 1.1 on 06/18/21  Sq heparin held this morning, last given on 6/6 2105 hours  Pt allergic to PCN, Vanc 1 g ordered.   Risks and benefits of bone lesion bx, asblation, and KP were discussed with the patient including, but not limited to education regarding the natural healing process of compression fractures without intervention, bleeding, infection, low yield, cement migration which may cause spinal cord damage, paralysis, pulmonary embolism or even death.  This interventional procedure involves the use of X-rays and because of the nature of the planned procedure, it is possible that we will have prolonged use of X-ray fluoroscopy.  Potential radiation risks to you include (but are not limited to) the following: - A slightly elevated risk for cancer  several years later in life. This risk is typically less than 0.5% percent. This risk is low in comparison to the normal incidence of human cancer, which is 33% for women and 50% for men according to the [place]. - Radiation induced injury can include skin redness, resembling a  rash, tissue breakdown / ulcers and hair loss (which can be temporary or permanent).   The likelihood of either of these occurring depends on the difficulty of the procedure and whether you are sensitive to radiation due to previous procedures, disease, or genetic conditions.   IF your procedure requires a prolonged use of radiation, you will be notified and given written instructions for further action.  It is your responsibility to monitor the irradiated area for the 2 weeks following the procedure and to notify your physician if you are concerned that you have suffered a radiation induced injury.    All of the patient's questions were answered, patient is agreeable to proceed.  Consent signed and in chart.   Thank you for this interesting consult.  I greatly enjoyed meeting Raymond Henderson and look forward to participating in their care.  A copy of this report was sent to the requesting provider on this  date.  Electronically Signed: Tera Mater, PA-C 06/20/2021, 9:38 AM   I spent a total of 40 Minutes    in face to face in clinical consultation, greater than 50% of which was counseling/coordinating care for T11 and L1 pathologic fx.   This chart was dictated using voice recognition software.  Despite best efforts to proofread,  errors can occur which can change the documentation meaning.

## 2021-06-20 NOTE — Assessment & Plan Note (Signed)
Stable  Continue monitoring

## 2021-06-20 NOTE — Assessment & Plan Note (Addendum)
Likely from pain medications.  However, pain control is very essential -Reorientation and delirium precautions -Fall precautions

## 2021-06-20 NOTE — Progress Notes (Signed)
Patient is s/p T 11 and L1 bone lesion ablation and KP by Dr. Karenann Cai today.   Procedure occurred w/o difficulty, patient was transferred back to his room.  Patient was called at 1651 hours.   Patient's only complaints at the moment is the condom catheter.  Informed the patient that it should be removed shortly.  Informed RN that the condom cath can be removed as bedrest should be over.  Further investigation, patient already had the condom cath in place when he came down to radiology this morning.  Will defer management to primary team.  RN updated.   NIR will follow up tomorrow.  Please call for questions and concerns.    Raymond Henderson Humna Moorehouse PA-C 06/20/2021 4:53 PM

## 2021-06-20 NOTE — Assessment & Plan Note (Signed)
Concerning for malignancy.

## 2021-06-20 NOTE — Hospital Course (Addendum)
71 year old M with no known PMH presented to ED with back pain for about 5 weeks and abdominal pain and constipation for about 10 days and found to have ascending colon mass with liver, vertebral and pelvic lesions as well as T11 and L1 pathologic fracture without significant canal stenosis.  Underwent US-guided liver biopsy on 6/5.  Pathology with poorly differentiated adenocarcinoma.  He had T11 and L1 vertebral biopsy and kyphoplasty on 6/7. Path with metastatic high-grade neuroendocrine carcinoma, favor small cell  type.  Radiation oncology consulted and started palliative radiation on 6/8.  Oncology, radiation oncology and palliative medicine following.  Transferring to [facility] for radiation treatment.  Therapy recommended SNF.  Patient getting consolidative radiation therapy till Friday patient has a skilled nursing facility approved for 6/14, SNF will be able to transfer him daily for radiation therapy.  Palliative care ordered and over felt that he is very appropriate for hospice.overall is medically stable.

## 2021-06-20 NOTE — Assessment & Plan Note (Signed)
In the setting of osseous metastasis. -Continue monitoring

## 2021-06-20 NOTE — Progress Notes (Signed)
PROGRESS NOTE  Raymond Henderson GXQ:[id number] DOB: 1950-10-03   PCP: Kathalene Frames, MD  Patient is from: Home.  Lives alone.  DOA: 06/17/2021 LOS: 2  Chief complaints Chief Complaint  Patient presents with   Fall   Back Pain     Brief Narrative / Interim history: 71 year old M with no known PMH presented to ED with back pain for about 5 weeks and abdominal pain and constipation for about 10 days and found to have ascending colon mass with liver, vertebral and pelvic lesions as well as T11 and L1 pathologic fracture without significant canal stenosis.  Underwent US-guided liver biopsy on 6/5, and T11 and L1 vertebral biopsy and kyphoplasty on 6/7.  Oncology consulted.   Plan for radiation oncology beginning with simulation on 6/8.  Oncology following.   Subjective: Seen and examined this afternoon after he returned from kyphoplasty.  He is somewhat confused and trying to get out of the bed.  He is redirectable.  He is oriented to self, place and situation.  Reports some pain in his back.  No other complaints.  Objective: Vitals:   06/20/21 1235 06/20/21 1240 06/20/21 1255 06/20/21 1356  BP: (!) 129/94 (!) 142/87 126/83 122/89  Pulse: 90 88 85 96  Resp: '16 17 16 16  '$ Temp:    98.5 F (36.9 C)  TempSrc:    Oral  SpO2: 100% 97% 96% 98%  Weight:      Height:        Examination:  GENERAL: No apparent distress.  Nontoxic. HEENT: MMM.  Vision and hearing grossly intact.  NECK: Supple.  No apparent JVD.  RESP:  No IWOB.  Fair aeration bilaterally. CVS:  RRR. Heart sounds normal.  ABD/GI/GU: BS+. Abd soft, NTND.  MSK/EXT:  Moves extremities. No apparent deformity. No edema.  SKIN: no apparent skin lesion or wound NEURO: Awake, alert and oriented to self, place and situation.  No apparent focal neuro deficit. PSYCH: Calm. Normal affect.   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  Imaging revealed ascending colon mass with liver and vertebral lesions as well as T11 and L1 pathologic fractures. -S/p US guided liver biopsy on 6/5 -S/p T11 and L1 biopsy as well as kyphoplasty on 6/7 -Plan for radiation starting with simulation on 6/8 -Pain control with fentanyl patch, oxycodone and IV Dilaudid -Bowel regimen  Pathologic T11 and L1 fracture S/p image guided biopsy and kyphoplasty on 6/7. Pain control as above  Hypercalcemia Likely due to malignancy.  Seems to have improved today. -Trial of IV fluid hydration -Consider bisphosphonate  Normocytic anemia Recent Labs    06/17/21 2138 06/18/21 0810 06/19/21 0232 06/20/21 0359  HGB 11.4* 11.4* 12.4* 11.9*  Likely anemia of chronic disease.  Relatively stable.   Goals of care, counseling/discussion See discussion by previous attending.  Goal status changed to DNR/DNI.  Palliative medicine consulted.  Toxic encephalopathy Likely from anesthesia and pain medication. -Reorientation and delirium precautions -Fall precautions -Pain control -Bladder scan to rule out urinary retention  Elevated alkaline phosphatase level In the setting of osseous metastasis. -Continue monitoring  Hyponatremia Stable.  Continue monitoring  Colonic mass Concerning for malignancy.   DVT prophylaxis:  heparin injection 5,000 Units Start: 06/20/21 2200 SCDs Start: 06/18/21 0749  Code Status: DNR/DNI Family Communication: Patient and/or RN. Available if any question.  Level of care: Telemetry Medical Status is: Inpatient Remains inpatient appropriate because: Due to possible metastatic cancer with pathologic fracture requiring pain control   Final disposition: TBD Consultants:  Oncology Palliative medicine  Sch Meds:  Scheduled Meds:  bisacodyl  10 mg Rectal BID   bupivacaine(PF)        bupivacaine(PF)       feeding supplement  237 mL Oral TID BM   fentaNYL  1 patch Transdermal Q72H   fentaNYL       fentaNYL       heparin injection (subcutaneous)  5,000 Units Subcutaneous Q8H   ketorolac       lidocaine       lidocaine       midazolam       midazolam       polyethylene glycol  34 g Oral QID   senna-docusate  2 tablet Oral BID   Continuous Infusions:  sodium chloride 50 mL/hr at 06/19/21 1829   PRN Meds:.acetaminophen **OR** acetaminophen, HYDROmorphone (DILAUDID) injection, iohexol, ondansetron **OR** ondansetron (ZOFRAN) IV, oxyCODONE, polyethylene glycol  Antimicrobials: Anti-infectives (From admission, onward)    Start     Dose/Rate Route Frequency Ordered Stop   06/20/21 1045  vancomycin (VANCOCIN) IVPB 1000 mg/200 mL premix        1,000 mg 200 mL/hr over 60 Minutes Intravenous To Radiology 06/20/21 0950 06/20/21 1316        I have personally reviewed the following labs and images: CBC: Recent Labs  Lab 06/17/21 2138 06/18/21 0810 06/19/21 0232 06/20/21 0359  WBC 17.0* 14.0* 15.0* 16.3*  NEUTROABS  --  10.8* 12.1*  --   HGB 11.4* 11.4* 12.4* 11.9*  HCT 36.4* 36.1* 37.7* 36.8*  MCV 83.3 83.6 81.6 82.1  PLT 346 326 239 332   BMP &GFR Recent Labs  Lab 06/17/21 2138 06/18/21 0810 06/19/21 0443 06/20/21 0542  NA 132* 135 134* 133*  K 4.0 4.1 4.5 3.9  CL 96* 98 99 96*  CO2 '25 27 27 25  '$ GLUCOSE 110* 106* 123* 137*  BUN 24* '19 20 17  '$ CREATININE 1.01 0.87 0.95 0.86  CALCIUM 10.5* 10.8* 11.7* 10.7*  MG  --   --  2.3  --    Estimated Creatinine Clearance: 77.6 mL/min (by C-G formula based on SCr of 0.86 mg/dL). Liver & Pancreas: Recent Labs  Lab 06/18/21 0509 06/18/21 0810 06/19/21 0443 06/20/21 0542  AST 73* 68* 63* 59*  ALT 46* 46* 40 38  ALKPHOS 725* 759* 792* 797*  BILITOT 2.0* 1.8* 2.1* 1.7*  PROT 6.7 6.5 6.6 6.6  ALBUMIN 2.5* 2.4* 2.5* 2.5*   No results for input(s): LIPASE, AMYLASE in the last 168 hours. No results for  input(s): AMMONIA in the last 168 hours. Diabetic: No results for input(s): HGBA1C in the last 72 hours. No results for input(s): GLUCAP in the last 168 hours. Cardiac Enzymes: No results for input(s): CKTOTAL, CKMB, CKMBINDEX, TROPONINI in the last 168 hours. No results for input(s): PROBNP in the last 8760 hours. Coagulation Profile: Recent Labs  Lab 06/18/21 0509  INR 1.1   Thyroid Function Tests: No results for input(s): TSH, T4TOTAL, FREET4, T3FREE, THYROIDAB in the last 72 hours. Lipid Profile: No results for input(s): CHOL, HDL, LDLCALC, TRIG, CHOLHDL, LDLDIRECT in the last 72 hours. Anemia Panel: Recent Labs    06/18/21 0810  TIBC 202*  IRON 31*   Urine analysis:    Component Value Date/Time   COLORURINE YELLOW 06/18/2021 0436   APPEARANCEUR  CLEAR 06/18/2021 0436   LABSPEC 1.012 06/18/2021 0436   PHURINE 7.0 06/18/2021 0436   GLUCOSEU NEGATIVE 06/18/2021 0436   HGBUR NEGATIVE 06/18/2021 0436   BILIRUBINUR NEGATIVE 06/18/2021 0436   KETONESUR NEGATIVE 06/18/2021 0436   PROTEINUR NEGATIVE 06/18/2021 0436   NITRITE NEGATIVE 06/18/2021 0436   LEUKOCYTESUR NEGATIVE 06/18/2021 0436   Sepsis Labs: Invalid input(s): PROCALCITONIN, [place]  Microbiology: Recent Results (from the past 240 hour(s))  Culture, blood (Routine X 2) w Reflex to ID Panel     Status: None (Preliminary result)   Collection Time: 06/18/21  5:09 AM   Specimen: BLOOD LEFT HAND  Result Value Ref Range Status   Specimen Description BLOOD LEFT HAND  Final   Special Requests   Final    AEROBIC BOTTLE ONLY Blood Culture results may not be optimal due to an inadequate volume of blood received in culture bottles   Culture   Final    NO GROWTH 2 DAYS Performed at [facility], [place] [address], [place], [place] [address]    Report Status PENDING  Incomplete  Culture, blood (Routine X 2) w Reflex to ID Panel     Status: None (Preliminary result)   Collection Time: 06/18/21  5:18 AM    Specimen: BLOOD  Result Value Ref Range Status   Specimen Description BLOOD LEFT ANTECUBITAL  Final   Special Requests AEROBIC BOTTLE ONLY Blood Culture adequate volume  Final   Culture   Final    NO GROWTH 2 DAYS Performed at [facility], [place] [address] [place], [place] [address]    Report Status PENDING  Incomplete    Radiology Studies: IR Bone Tumor(s)RF Ablation  Result Date: 06/20/2021 INDICATION: Raymond Henderson is a 71 year old male with no significant past medical history who presented to ED on 06/17/21 due to back pain. 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 Patient rates pain as 9-10/10. She comes to our service for a fluoroscopy guided core bone biopsy, radiofrequency ablation and balloon kyphoplasty. EXAM: FLUOROSCOPY GUIDED T11 AND L1 CORE BONE BIOPSY, RADIOFREQUENCY ABLATION AND BALLOON KYPHOPLASTY COMPARISON:  None Available. MEDICATIONS: Vancomycin 1 g IV; The antibiotic was administered in an appropriate time interval prior to needle puncture of the skin. ANESTHESIA/SEDATION: A total of 4 mg of Versed and 200 mcg of fentanyl were administered intravenously for moderate conscious sedation monitored under my direct supervision. Total intraservice time of sedation was 87 minutes. The patient's vital signs were monitored throughout the procedure and recorded in the patient's medical record by the nurse. FLUOROSCOPY: Radiation Exposure Index (as provided by the fluoroscopic device): 4438 mGy Kerma PSD. COMPLICATIONS: None immediate. TECHNIQUE: Informed written consent was obtained from the patient after a thorough discussion of the procedural risks, benefits and alternatives. All questions were addressed. Maximal Sterile Barrier Technique was utilized including caps, mask, sterile gowns, sterile gloves, sterile drape, hand hygiene and skin antiseptic. A  timeout was performed prior to the initiation of the procedure. The patient was placed in prone position on the angiography table. The thoracolumbar region was prepped and draped in a sterile fashion. Under fluoroscopy, the T11 vertebral body was delineated and the skin area was marked. The skin was infiltrated with a 1% Lidocaine approximately 3.3 cm lateral to the spinous process projection on the right. Using a 22-gauge spinal needle, the soft issue and the peripedicular space and periosteum  were infiltrated with Bupivacaine 0.5%. A skin incision was made at the access site. Subsequently, an 11-gauge Kyphon trocar was inserted under fluoroscopic guidance until contact with the pedicle was obtained. The trocar was inserted into the pedicle with light hammer tapping until the posterior boundaries of the vertebral body was reached. The diamond mandrill was removed and one core biopsy was obtained. The skin was infiltrated with a 1% Lidocaine approximately 3.2 cm lateral to the spinous process projection on the left. Using a 22-gauge spinal needle, the soft issue and the peripedicular space and periosteum were infiltrated with Bupivacaine 0.5%. A skin incision was made at the access site. Subsequently, an 11-gauge Kyphon trocar was inserted under fluoroscopic guidance until contact with the pedicle was obtained. The trocar was inserted with light hammer tapping into the pedicle until the posterior boundaries of the vertebral body was reached. A bone drill was coaxially advanced within the anterior third of the vertebral body on each side for proper RF probes size selection. An OsteoCool RF probe was inserted bilaterally within the mid-posterior third of the vertebral body. The radiofrequency ablation cycle was performed. Thereafter, the RF probes were exchanged for inflatable Kyphon balloons, which were centered within the mid-aspect of the vertebral body. The balloons were inflated to create a void to serve as a  repository for the bone cement. Both balloons were deflated and through both cannulas, under continuous fluoroscopy guidance in the AP and lateral views, the vertebral body was filled with previously mixed polymethyl-methacrylate (PMMA) added to barium for opacification. Both cannulas were later removed. Next, the L1 vertebral body was delineated under fluoroscopy and the skin area was marked. The skin was infiltrated with a 1% Lidocaine approximately 4.5 cm lateral to the spinous process projection on the right. Using a 22-gauge spinal needle, the soft issue and the peripedicular space and periosteum were infiltrated with Bupivacaine 0.5%. A skin incision was made at the access site. Subsequently, an 11-gauge Kyphon trocar was inserted under fluoroscopic guidance until contact with the pedicle was obtained. The trocar was inserted into the pedicle with light hammer tapping until the posterior boundaries of the vertebral body was reached. The diamond mandrill was removed and one core biopsy was obtained. The skin was infiltrated with a 1% Lidocaine approximately 4.5 cm lateral to the spinous process projection on the left. Using a 22-gauge spinal needle, the soft issue and the peripedicular space and periosteum were infiltrated with Bupivacaine 0.5%. A skin incision was made at the access site. Subsequently, an 11-gauge Kyphon trocar was inserted under fluoroscopic guidance until contact with the pedicle was obtained. The trocar was inserted with light hammer tapping into the pedicle until the posterior boundaries of the vertebral body was reached. A bone drill was coaxially advanced within the anterior third of the vertebral body on each side for proper RF probes size selection. An OsteoCool RF probe was inserted bilaterally within the mid-posterior third of the vertebral body. The radiofrequency ablation cycle was performed. Thereafter, the RF probes were exchanged for inflatable Kyphon balloons, which were  centered within the mid-aspect of the vertebral body. The balloons were inflated to create a void to serve as a repository for the bone cement. Both balloons were deflated and through both cannulas, under continuous fluoroscopy guidance in the AP and lateral views, the vertebral body was filled with previously mixed polymethyl-methacrylate (PMMA) added to barium for opacification. Both cannulas were later removed. The skin was cleaned and access sites were covered with sterile bandages. FINDINGS:  Pathologic fractures of the T11 and L1 vertebral bodies, as seen on prior cross-sectional imaging. IMPRESSION: Successful and uncomplicated core bone biopsy of the T11 and L1 vertebral bodies followed by radiofrequency ablation and balloon kyphoplasty for treatment of symptomatic pathologic fractures. Electronically Signed   By: Pedro Earls M.D.   On: 06/20/2021 13:38   IR Bone Tumor(s)RF Ablation  Result Date: 06/20/2021 INDICATION: Raymond Henderson is a 71 year old male with no significant past medical history who presented to ED on 06/17/21 due to back pain. 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 Patient rates pain as 9-10/10. She comes to our service for a fluoroscopy guided core bone biopsy, radiofrequency ablation and balloon kyphoplasty. EXAM: FLUOROSCOPY GUIDED T11 AND L1 CORE BONE BIOPSY, RADIOFREQUENCY ABLATION AND BALLOON KYPHOPLASTY COMPARISON:  None Available. MEDICATIONS: Vancomycin 1 g IV; The antibiotic was administered in an appropriate time interval prior to needle puncture of the skin. ANESTHESIA/SEDATION: A total of 4 mg of Versed and 200 mcg of fentanyl were administered intravenously for moderate conscious sedation monitored under my direct supervision. Total intraservice time of sedation was 87 minutes. The patient's vital signs were monitored throughout  the procedure and recorded in the patient's medical record by the nurse. FLUOROSCOPY: Radiation Exposure Index (as provided by the fluoroscopic device): 4438 mGy Kerma PSD. COMPLICATIONS: None immediate. TECHNIQUE: Informed written consent was obtained from the patient after a thorough discussion of the procedural risks, benefits and alternatives. All questions were addressed. Maximal Sterile Barrier Technique was utilized including caps, mask, sterile gowns, sterile gloves, sterile drape, hand hygiene and skin antiseptic. A timeout was performed prior to the initiation of the procedure. The patient was placed in prone position on the angiography table. The thoracolumbar region was prepped and draped in a sterile fashion. Under fluoroscopy, the T11 vertebral body was delineated and the skin area was marked. The skin was infiltrated with a 1% Lidocaine approximately 3.3 cm lateral to the spinous process projection on the right. Using a 22-gauge spinal needle, the soft issue and the peripedicular space and periosteum were infiltrated with Bupivacaine 0.5%. A skin incision was made at the access site. Subsequently, an 11-gauge Kyphon trocar was inserted under fluoroscopic guidance until contact with the pedicle was obtained. The trocar was inserted into the pedicle with light hammer tapping until the posterior boundaries of the vertebral body was reached. The diamond mandrill was removed and one core biopsy was obtained. The skin was infiltrated with a 1% Lidocaine approximately 3.2 cm lateral to the spinous process projection on the left. Using a 22-gauge spinal needle, the soft issue and the peripedicular space and periosteum were infiltrated with Bupivacaine 0.5%. A skin incision was made at the access site. Subsequently, an 11-gauge Kyphon trocar was inserted under fluoroscopic guidance until contact with the pedicle was obtained. The trocar was inserted with light hammer tapping into the pedicle until the  posterior boundaries of the vertebral body was reached. A bone drill was coaxially advanced within the anterior third of the vertebral body on each side for proper RF probes size selection. An OsteoCool RF probe was inserted bilaterally within the mid-posterior third of the vertebral body. The radiofrequency ablation cycle was performed. Thereafter, the RF probes were exchanged for inflatable Kyphon balloons, which were centered within the mid-aspect of the vertebral body. The balloons were inflated to create a void to serve as a  repository for the bone cement. Both balloons were deflated and through both cannulas, under continuous fluoroscopy guidance in the AP and lateral views, the vertebral body was filled with previously mixed polymethyl-methacrylate (PMMA) added to barium for opacification. Both cannulas were later removed. Next, the L1 vertebral body was delineated under fluoroscopy and the skin area was marked. The skin was infiltrated with a 1% Lidocaine approximately 4.5 cm lateral to the spinous process projection on the right. Using a 22-gauge spinal needle, the soft issue and the peripedicular space and periosteum were infiltrated with Bupivacaine 0.5%. A skin incision was made at the access site. Subsequently, an 11-gauge Kyphon trocar was inserted under fluoroscopic guidance until contact with the pedicle was obtained. The trocar was inserted into the pedicle with light hammer tapping until the posterior boundaries of the vertebral body was reached. The diamond mandrill was removed and one core biopsy was obtained. The skin was infiltrated with a 1% Lidocaine approximately 4.5 cm lateral to the spinous process projection on the left. Using a 22-gauge spinal needle, the soft issue and the peripedicular space and periosteum were infiltrated with Bupivacaine 0.5%. A skin incision was made at the access site. Subsequently, an 11-gauge Kyphon trocar was inserted under fluoroscopic guidance until contact  with the pedicle was obtained. The trocar was inserted with light hammer tapping into the pedicle until the posterior boundaries of the vertebral body was reached. A bone drill was coaxially advanced within the anterior third of the vertebral body on each side for proper RF probes size selection. An OsteoCool RF probe was inserted bilaterally within the mid-posterior third of the vertebral body. The radiofrequency ablation cycle was performed. Thereafter, the RF probes were exchanged for inflatable Kyphon balloons, which were centered within the mid-aspect of the vertebral body. The balloons were inflated to create a void to serve as a repository for the bone cement. Both balloons were deflated and through both cannulas, under continuous fluoroscopy guidance in the AP and lateral views, the vertebral body was filled with previously mixed polymethyl-methacrylate (PMMA) added to barium for opacification. Both cannulas were later removed. The skin was cleaned and access sites were covered with sterile bandages. FINDINGS: Pathologic fractures of the T11 and L1 vertebral bodies, as seen on prior cross-sectional imaging. IMPRESSION: Successful and uncomplicated core bone biopsy of the T11 and L1 vertebral bodies followed by radiofrequency ablation and balloon kyphoplasty for treatment of symptomatic pathologic fractures. Electronically Signed   By: Pedro Earls M.D.   On: 06/20/2021 13:38   IR KYPHO THORACIC WITH BONE BIOPSY  Result Date: 06/20/2021 INDICATION: Raymond Henderson is a 71 year old male with no significant past medical history who presented to ED on 06/17/21 due to back pain. 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 Patient rates pain as 9-10/10. She comes to our service for a fluoroscopy guided core bone biopsy, radiofrequency ablation and balloon kyphoplasty. EXAM:  FLUOROSCOPY GUIDED T11 AND L1 CORE BONE BIOPSY, RADIOFREQUENCY ABLATION AND BALLOON KYPHOPLASTY COMPARISON:  None Available. MEDICATIONS: Vancomycin 1 g IV; The antibiotic was administered in an appropriate time interval prior to needle puncture of the skin. ANESTHESIA/SEDATION: A total of 4 mg of Versed and 200 mcg of fentanyl were administered intravenously for moderate conscious sedation monitored under my direct supervision. Total intraservice time of sedation was 87 minutes. The patient's vital signs were monitored throughout the procedure and recorded  in the patient's medical record by the nurse. FLUOROSCOPY: Radiation Exposure Index (as provided by the fluoroscopic device): 4438 mGy Kerma PSD. COMPLICATIONS: None immediate. TECHNIQUE: Informed written consent was obtained from the patient after a thorough discussion of the procedural risks, benefits and alternatives. All questions were addressed. Maximal Sterile Barrier Technique was utilized including caps, mask, sterile gowns, sterile gloves, sterile drape, hand hygiene and skin antiseptic. A timeout was performed prior to the initiation of the procedure. The patient was placed in prone position on the angiography table. The thoracolumbar region was prepped and draped in a sterile fashion. Under fluoroscopy, the T11 vertebral body was delineated and the skin area was marked. The skin was infiltrated with a 1% Lidocaine approximately 3.3 cm lateral to the spinous process projection on the right. Using a 22-gauge spinal needle, the soft issue and the peripedicular space and periosteum were infiltrated with Bupivacaine 0.5%. A skin incision was made at the access site. Subsequently, an 11-gauge Kyphon trocar was inserted under fluoroscopic guidance until contact with the pedicle was obtained. The trocar was inserted into the pedicle with light hammer tapping until the posterior boundaries of the vertebral body was reached. The diamond mandrill was removed and  one core biopsy was obtained. The skin was infiltrated with a 1% Lidocaine approximately 3.2 cm lateral to the spinous process projection on the left. Using a 22-gauge spinal needle, the soft issue and the peripedicular space and periosteum were infiltrated with Bupivacaine 0.5%. A skin incision was made at the access site. Subsequently, an 11-gauge Kyphon trocar was inserted under fluoroscopic guidance until contact with the pedicle was obtained. The trocar was inserted with light hammer tapping into the pedicle until the posterior boundaries of the vertebral body was reached. A bone drill was coaxially advanced within the anterior third of the vertebral body on each side for proper RF probes size selection. An OsteoCool RF probe was inserted bilaterally within the mid-posterior third of the vertebral body. The radiofrequency ablation cycle was performed. Thereafter, the RF probes were exchanged for inflatable Kyphon balloons, which were centered within the mid-aspect of the vertebral body. The balloons were inflated to create a void to serve as a repository for the bone cement. Both balloons were deflated and through both cannulas, under continuous fluoroscopy guidance in the AP and lateral views, the vertebral body was filled with previously mixed polymethyl-methacrylate (PMMA) added to barium for opacification. Both cannulas were later removed. Next, the L1 vertebral body was delineated under fluoroscopy and the skin area was marked. The skin was infiltrated with a 1% Lidocaine approximately 4.5 cm lateral to the spinous process projection on the right. Using a 22-gauge spinal needle, the soft issue and the peripedicular space and periosteum were infiltrated with Bupivacaine 0.5%. A skin incision was made at the access site. Subsequently, an 11-gauge Kyphon trocar was inserted under fluoroscopic guidance until contact with the pedicle was obtained. The trocar was inserted into the pedicle with light hammer  tapping until the posterior boundaries of the vertebral body was reached. The diamond mandrill was removed and one core biopsy was obtained. The skin was infiltrated with a 1% Lidocaine approximately 4.5 cm lateral to the spinous process projection on the left. Using a 22-gauge spinal needle, the soft issue and the peripedicular space and periosteum were infiltrated with Bupivacaine 0.5%. A skin incision was made at the access site. Subsequently, an 11-gauge Kyphon trocar was inserted under fluoroscopic guidance until contact with the pedicle was obtained. The trocar was  inserted with light hammer tapping into the pedicle until the posterior boundaries of the vertebral body was reached. A bone drill was coaxially advanced within the anterior third of the vertebral body on each side for proper RF probes size selection. An OsteoCool RF probe was inserted bilaterally within the mid-posterior third of the vertebral body. The radiofrequency ablation cycle was performed. Thereafter, the RF probes were exchanged for inflatable Kyphon balloons, which were centered within the mid-aspect of the vertebral body. The balloons were inflated to create a void to serve as a repository for the bone cement. Both balloons were deflated and through both cannulas, under continuous fluoroscopy guidance in the AP and lateral views, the vertebral body was filled with previously mixed polymethyl-methacrylate (PMMA) added to barium for opacification. Both cannulas were later removed. The skin was cleaned and access sites were covered with sterile bandages. FINDINGS: Pathologic fractures of the T11 and L1 vertebral bodies, as seen on prior cross-sectional imaging. IMPRESSION: Successful and uncomplicated core bone biopsy of the T11 and L1 vertebral bodies followed by radiofrequency ablation and balloon kyphoplasty for treatment of symptomatic pathologic fractures. Electronically Signed   By: Pedro Earls M.D.   On: 06/20/2021  13:38   IR KYPHO EA ADDL LEVEL THORACIC OR LUMBAR  Result Date: 06/20/2021 INDICATION: Raymond Henderson is a 71 year old male with no significant past medical history who presented to ED on 06/17/21 due to back pain. 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 Patient rates pain as 9-10/10. She comes to our service for a fluoroscopy guided core bone biopsy, radiofrequency ablation and balloon kyphoplasty. EXAM: FLUOROSCOPY GUIDED T11 AND L1 CORE BONE BIOPSY, RADIOFREQUENCY ABLATION AND BALLOON KYPHOPLASTY COMPARISON:  None Available. MEDICATIONS: Vancomycin 1 g IV; The antibiotic was administered in an appropriate time interval prior to needle puncture of the skin. ANESTHESIA/SEDATION: A total of 4 mg of Versed and 200 mcg of fentanyl were administered intravenously for moderate conscious sedation monitored under my direct supervision. Total intraservice time of sedation was 87 minutes. The patient's vital signs were monitored throughout the procedure and recorded in the patient's medical record by the nurse. FLUOROSCOPY: Radiation Exposure Index (as provided by the fluoroscopic device): 4438 mGy Kerma PSD. COMPLICATIONS: None immediate. TECHNIQUE: Informed written consent was obtained from the patient after a thorough discussion of the procedural risks, benefits and alternatives. All questions were addressed. Maximal Sterile Barrier Technique was utilized including caps, mask, sterile gowns, sterile gloves, sterile drape, hand hygiene and skin antiseptic. A timeout was performed prior to the initiation of the procedure. The patient was placed in prone position on the angiography table. The thoracolumbar region was prepped and draped in a sterile fashion. Under fluoroscopy, the T11 vertebral body was delineated and the skin area was marked. The skin was infiltrated with a 1% Lidocaine  approximately 3.3 cm lateral to the spinous process projection on the right. Using a 22-gauge spinal needle, the soft issue and the peripedicular space and periosteum were infiltrated with Bupivacaine 0.5%. A skin incision was made at the access site. Subsequently, an 11-gauge Kyphon trocar was inserted under fluoroscopic guidance until contact with the pedicle was obtained. The trocar was inserted into the pedicle with light hammer tapping until the posterior boundaries of the vertebral body was reached. The diamond mandrill was removed and one core biopsy was obtained. The skin was infiltrated with a 1% Lidocaine  approximately 3.2 cm lateral to the spinous process projection on the left. Using a 22-gauge spinal needle, the soft issue and the peripedicular space and periosteum were infiltrated with Bupivacaine 0.5%. A skin incision was made at the access site. Subsequently, an 11-gauge Kyphon trocar was inserted under fluoroscopic guidance until contact with the pedicle was obtained. The trocar was inserted with light hammer tapping into the pedicle until the posterior boundaries of the vertebral body was reached. A bone drill was coaxially advanced within the anterior third of the vertebral body on each side for proper RF probes size selection. An OsteoCool RF probe was inserted bilaterally within the mid-posterior third of the vertebral body. The radiofrequency ablation cycle was performed. Thereafter, the RF probes were exchanged for inflatable Kyphon balloons, which were centered within the mid-aspect of the vertebral body. The balloons were inflated to create a void to serve as a repository for the bone cement. Both balloons were deflated and through both cannulas, under continuous fluoroscopy guidance in the AP and lateral views, the vertebral body was filled with previously mixed polymethyl-methacrylate (PMMA) added to barium for opacification. Both cannulas were later removed. Next, the L1 vertebral body  was delineated under fluoroscopy and the skin area was marked. The skin was infiltrated with a 1% Lidocaine approximately 4.5 cm lateral to the spinous process projection on the right. Using a 22-gauge spinal needle, the soft issue and the peripedicular space and periosteum were infiltrated with Bupivacaine 0.5%. A skin incision was made at the access site. Subsequently, an 11-gauge Kyphon trocar was inserted under fluoroscopic guidance until contact with the pedicle was obtained. The trocar was inserted into the pedicle with light hammer tapping until the posterior boundaries of the vertebral body was reached. The diamond mandrill was removed and one core biopsy was obtained. The skin was infiltrated with a 1% Lidocaine approximately 4.5 cm lateral to the spinous process projection on the left. Using a 22-gauge spinal needle, the soft issue and the peripedicular space and periosteum were infiltrated with Bupivacaine 0.5%. A skin incision was made at the access site. Subsequently, an 11-gauge Kyphon trocar was inserted under fluoroscopic guidance until contact with the pedicle was obtained. The trocar was inserted with light hammer tapping into the pedicle until the posterior boundaries of the vertebral body was reached. A bone drill was coaxially advanced within the anterior third of the vertebral body on each side for proper RF probes size selection. An OsteoCool RF probe was inserted bilaterally within the mid-posterior third of the vertebral body. The radiofrequency ablation cycle was performed. Thereafter, the RF probes were exchanged for inflatable Kyphon balloons, which were centered within the mid-aspect of the vertebral body. The balloons were inflated to create a void to serve as a repository for the bone cement. Both balloons were deflated and through both cannulas, under continuous fluoroscopy guidance in the AP and lateral views, the vertebral body was filled with previously mixed  polymethyl-methacrylate (PMMA) added to barium for opacification. Both cannulas were later removed. The skin was cleaned and access sites were covered with sterile bandages. FINDINGS: Pathologic fractures of the T11 and L1 vertebral bodies, as seen on prior cross-sectional imaging. IMPRESSION: Successful and uncomplicated core bone biopsy of the T11 and L1 vertebral bodies followed by radiofrequency ablation and balloon kyphoplasty for treatment of symptomatic pathologic fractures. Electronically Signed   By: Pedro Earls M.D.   On: 06/20/2021 13:38      Raymond Henderson T. [place]  If 7PM-7AM,  please contact night-coverage [email or web address] 06/20/2021, 4:23 PM

## 2021-06-20 NOTE — Progress Notes (Signed)
   Chart reviewed. Attempted to visit patient at the bedside for ongoing goals of care discussions and anticipatory care planning. He was out of his room for procedure. Will follow up tomorrow. Per notes, patient would like to name his friend Horris Latino as Economist.   Dorthy Cooler, [facility] Palliative Medicine Team  Team Phone # [phone]   NO CHARGE

## 2021-06-20 NOTE — Progress Notes (Signed)
MEDICATION-RELATED CONSULT NOTE   IR Procedure Consult - Anticoagulant/Antiplatelet PTA/Inpatient Med List Review by Pharmacist    Procedure: Fluoroscopy guided T-11 and L-1 core bone biopsy, RFA and kyphoplasty    Completed: 06/20/21 at 12:45  Post-Procedural bleeding risk per IR MD assessment:  standard  Antithrombotic medications on inpatient or PTA profile prior to procedure:     Heparin 5000 units SQ q8hrs.    Given 6/6 pm and held this morning.   Recommended restart time per IR Post-Procedure Guidelines:     At least 6 hours post-procedure, or next standard dosing interval  Plan:       Resume SQ heparin at 10pm tonight.   No Pharmacy follow-up needed, signing off.   Arty Baumgartner, Register 06/20/2021 1:19 PM

## 2021-06-21 ENCOUNTER — Ambulatory Visit
Admit: 2021-06-21 | Discharge: 2021-06-21 | Disposition: A | Discharge status: Home / Self Care | Payer: Medicare Other | Attending: Radiation Oncology | Admitting: Radiation Oncology

## 2021-06-21 ENCOUNTER — Encounter: Payer: Self-pay | Admitting: *Deleted

## 2021-06-21 DIAGNOSIS — R748 Abnormal levels of other serum enzymes: Secondary | ICD-10-CM | POA: Diagnosis not present

## 2021-06-21 DIAGNOSIS — K769 Liver disease, unspecified: Secondary | ICD-10-CM | POA: Diagnosis not present

## 2021-06-21 DIAGNOSIS — C189 Malignant neoplasm of colon, unspecified: Secondary | ICD-10-CM | POA: Diagnosis not present

## 2021-06-21 DIAGNOSIS — C799 Secondary malignant neoplasm of unspecified site: Secondary | ICD-10-CM | POA: Diagnosis not present

## 2021-06-21 DIAGNOSIS — G929 Unspecified toxic encephalopathy: Secondary | ICD-10-CM

## 2021-06-21 DIAGNOSIS — Z7189 Other specified counseling: Secondary | ICD-10-CM | POA: Diagnosis not present

## 2021-06-21 DIAGNOSIS — K6389 Other specified diseases of intestine: Secondary | ICD-10-CM | POA: Diagnosis not present

## 2021-06-21 LAB — RENAL FUNCTION PANEL
Albumin: 2.3 g/dL — ABNORMAL LOW (ref 3.5–5.0)
Anion gap: 10 (ref 5–15)
BUN: 25 mg/dL — ABNORMAL HIGH (ref 8–23)
CO2: 24 mmol/L (ref 22–32)
Calcium: 9.3 mg/dL (ref 8.9–10.3)
Chloride: 99 mmol/L (ref 98–111)
Creatinine, Ser: 0.85 mg/dL (ref 0.61–1.24)
GFR, Estimated: >60 mL/min (ref >60)
Glucose, Bld: 102 mg/dL — ABNORMAL HIGH (ref 70–99)
Phosphorus: 2.1 mg/dL — ABNORMAL LOW (ref 2.5–4.6)
Potassium: 3.6 mmol/L (ref 3.5–5.1)
Sodium: 133 mmol/L — ABNORMAL LOW (ref 135–145)

## 2021-06-21 LAB — CBC
HCT: 32.4 % — ABNORMAL LOW (ref 39.0–52.0)
Hemoglobin: 10.4 g/dL — ABNORMAL LOW (ref 13.0–17.0)
MCH: 26.5 pg (ref 26.0–34.0)
MCHC: 32.1 g/dL (ref 30.0–36.0)
MCV: 82.7 fL (ref 80.0–100.0)
Platelets: 283 10*3/uL (ref 150–400)
RBC: 3.92 MIL/uL — ABNORMAL LOW (ref 4.22–5.81)
RDW: 18.6 % — ABNORMAL HIGH (ref 11.5–15.5)
WBC: 15.2 10*3/uL — ABNORMAL HIGH (ref 4.0–10.5)
nRBC: 0 % (ref 0.0–0.2)

## 2021-06-21 LAB — PREALBUMIN: Prealbumin: 5.6 mg/dL — ABNORMAL LOW (ref 18–38)

## 2021-06-21 LAB — SURGICAL PATHOLOGY

## 2021-06-21 LAB — MAGNESIUM: Magnesium: 2 mg/dL (ref 1.7–2.4)

## 2021-06-21 MED ORDER — POTASSIUM PHOSPHATES 15 MMOLE/5ML IV SOLN
15.0000 mmol | Freq: Once | INTRAVENOUS | Status: DC
  Filled 2021-06-21: qty 5

## 2021-06-21 MED ORDER — POLYETHYLENE GLYCOL 3350 17 G PO PACK
17.0000 g | PACK | Freq: Two times a day (BID) | ORAL | Status: DC | PRN
  Administered 2021-06-21: 17 g via ORAL

## 2021-06-21 NOTE — Progress Notes (Signed)
Per Dr Marin Olp, request for [facility] testing sent on specimen [id number] DOS 06/19/21.  Oncology Nurse Navigator Documentation     06/21/2021    8:30 AM  Oncology Nurse Navigator Flowsheets  Navigator Location [facility]  Navigator Encounter Type Molecular Studies  Treatment Phase Pre-Tx/Tx Discussion  Barriers/Navigation Needs Coordination of Care  Interventions Coordination of Care  Acuity Level 2-Minimal Needs (1-2 Barriers Identified)  Coordination of Care Pathology  Time Spent with Patient 30

## 2021-06-21 NOTE — Plan of Care (Signed)

## 2021-06-21 NOTE — Progress Notes (Signed)
Physical Therapy Treatment Patient Details Name: Raymond Henderson MRN: [id number] DOB: [birth year] Today's Date: 06/21/2021   History of Present Illness Pt is a 71 y/o male admitted secondary to worsening back pain. 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 Found to have liver mets and suspected primary lesion in colon. Workup pending. No pertinent PMH.    PT Comments    Pt demonstrated decreased activity tolerance this session and was limited by reports of back pain. Pt required cueing for technique throughout session but demonstrated good recall of cues upon second trials. Did not progress to ambulation this session due to pt tolerance level. RN requested therapist return patient to bed after session to prepare for transport. Pt would continue to benefit from acute PT to improve activity tolerance and functional mobility. Recommending SNF upon discharge at this time, pt would need to progress ambulation distance and activity tolerance in order to safely discharge home.    Recommendations for follow up therapy are one component of a multi-disciplinary discharge planning process, led by the attending physician.  Recommendations may be updated based on patient status, additional functional criteria and insurance authorization.  Follow Up Recommendations  Skilled nursing-short term rehab (<3 hours/day)     Assistance Recommended at Discharge Frequent or constant Supervision/Assistance  Patient can return home with the following A little help with walking and/or transfers;A little help with bathing/dressing/bathroom;Assistance with cooking/housework;Help with stairs or ramp for entrance;Assist for transportation   Equipment Recommendations  Rolling walker (2 wheels)    Recommendations for Other Services       Precautions / Restrictions Precautions Precautions: Back;Fall Precaution Booklet Issued: No Precaution Comments: Provided education on log roll for  comfort Restrictions Weight Bearing Restrictions: No     Mobility  Bed Mobility Overal bed mobility: Needs Assistance Bed Mobility: Rolling, Sidelying to Sit, Sit to Sidelying Rolling: Min assist Sidelying to sit: Mod assist, Min assist     Sit to sidelying: Min assist General bed mobility comments: instructed on log roll technique for comfort. manual assist to reach across to bed rail to roll. modA for firsty sidelying to sit trial with manual trunk assist. Min A for second trial for trunk assist. MinA to BLEs for sit to sidelying    Transfers Overall transfer level: Needs assistance Equipment used: Rolling walker (2 wheels) Transfers: Sit to/from Stand, Bed to chair/wheelchair/BSC Sit to Stand: Mod assist   Step pivot transfers: Min assist       General transfer comment: Cues for hand placement for STS from lowered bed. ModA for power up. MinA for step pivot transfers bed <> recliner to assist with RW and gait belt support. Pt demonstrated improved technique with second transfer trial    Ambulation/Gait                   Stairs             Wheelchair Mobility    Modified Rankin (Stroke Patients Only)       Balance Overall balance assessment: Needs assistance Sitting-balance support: Bilateral upper extremity supported, Feet supported Sitting balance-Leahy Scale: Poor Sitting balance - Comments: reliance on UE support   Standing balance support: Bilateral upper extremity supported, During functional activity, Reliant on assistive device for balance Standing balance-Leahy Scale: Poor Standing balance comment: reliance on RW in standing  Cognition Arousal/Alertness: Awake/alert Behavior During Therapy: WFL for tasks assessed/performed Overall Cognitive Status: Within Functional Limits for tasks assessed                                          Exercises      General Comments         Pertinent Vitals/Pain Pain Assessment Pain Assessment: Faces Faces Pain Scale: Hurts whole lot Pain Location: mid back Pain Descriptors / Indicators: Grimacing, Guarding, Moaning Pain Intervention(s): Limited activity within patient's tolerance, Monitored during session, Patient requesting pain meds-RN notified    Home Living                          Prior Function            PT Goals (current goals can now be found in the care plan section) Acute Rehab PT Goals Patient Stated Goal: to decrease pain PT Goal Formulation: With patient Time For Goal Achievement: 07/02/21 Potential to Achieve Goals: Good Progress towards PT goals: Progressing toward goals    Frequency    Min 3X/week      PT Plan Discharge plan needs to be updated    Co-evaluation              AM-PAC PT "6 Clicks" Mobility   Outcome Measure  Help needed turning from your back to your side while in a flat bed without using bedrails?: A Little Help needed moving from lying on your back to sitting on the side of a flat bed without using bedrails?: A Little Help needed moving to and from a bed to a chair (including a wheelchair)?: A Little Help needed standing up from a chair using your arms (e.g., wheelchair or bedside chair)?: A Lot Help needed to walk in hospital room?: Total Help needed climbing 3-5 steps with a railing? : Total 6 Click Score: 13    End of Session Equipment Utilized During Treatment: Gait belt Activity Tolerance: Patient limited by pain Patient left: in bed;with call bell/phone within reach;with nursing/sitter in room Nurse Communication: Mobility status PT Visit Diagnosis: Unsteadiness on feet (R26.81);Muscle weakness (generalized) (M62.81);Difficulty in walking, not elsewhere classified (R26.2);Pain Pain - part of body:  (back)     Time: 0998-3382 PT Time Calculation (min) (ACUTE ONLY): 35 min  Charges:  $Therapeutic Activity: 23-37 mins                      [name], SPT Acute Rehabilitation Services  Office: [phone]    [name] 06/21/2021, 11:26 AM

## 2021-06-21 NOTE — Progress Notes (Signed)
Supervising Physician: Pedro Earls  Patient Status:  [facility] - In-pt  Chief Complaint:  1 day s/p osteocool/KP T11, L1  Subjective:  Feeling better.  Back pain only a 2/10 today.  Allergies: Penicillins  Medications: Prior to Admission medications   Medication Sig Start Date End Date Taking? Authorizing Provider  acetaminophen (TYLENOL) 650 MG CR tablet Take 1,300 mg by mouth every 8 (eight) hours.   Yes [provider]  benzonatate (TESSALON) 200 MG capsule Take 200 mg by mouth 3 (three) times daily as needed for cough. 06/05/21  Yes [provider]  cyclobenzaprine (FLEXERIL) 5 MG tablet Take 5 mg by mouth 3 (three) times daily as needed for muscle spasms. 06/15/21  Yes [provider]  gabapentin (NEURONTIN) 300 MG capsule Take 300 mg by mouth every 8 (eight) hours. 06/15/21  Yes [provider]  metroNIDAZOLE (FLAGYL) 500 MG tablet Take 500 mg by mouth 3 (three) times daily. Patient not taking: Reported on 06/18/2021 06/15/21   [provider]     Vital Signs: BP (!) 143/79 (BP Location: Right Arm)   Pulse 93   Temp 98 F (36.7 C) (Oral)   Resp 17   Ht '5\' 9"'$  (1.753 m)   Wt 184 lb (83.5 kg)   SpO2 97%   BMI 27.17 kg/m   Physical Exam Vitals reviewed.  Constitutional:      Appearance: He is ill-appearing.  HENT:     Head: Normocephalic and atraumatic.     Mouth/Throat:     Pharynx: Oropharynx is clear.  Eyes:     Extraocular Movements: Extraocular movements intact.  Cardiovascular:     Rate and Rhythm: Normal rate.  Pulmonary:     Effort: Pulmonary effort is normal.  Skin:    General: Skin is warm and dry.  Neurological:     General: No focal deficit present.     Mental Status: He is alert.  Psychiatric:        Mood and Affect: Mood normal.        Behavior: Behavior normal.   Back: Sites are clean and dry without evidence of bleeding.  No erythema or drainage.  Appropriately  bandaged.  Imaging: IR KYPHO THORACIC WITH BONE BIOPSY  Result Date: 06/20/2021 INDICATION: Mann Skaggs is a 71 year old male with no significant past medical history who presented to ED on 06/17/21 due to back pain. 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 Patient rates pain as 9-10/10. She comes to our service for a fluoroscopy guided core bone biopsy, radiofrequency ablation and balloon kyphoplasty. EXAM: FLUOROSCOPY GUIDED T11 AND L1 CORE BONE BIOPSY, RADIOFREQUENCY ABLATION AND BALLOON KYPHOPLASTY COMPARISON:  None Available. MEDICATIONS: Vancomycin 1 g IV; The antibiotic was administered in an appropriate time interval prior to needle puncture of the skin. ANESTHESIA/SEDATION: A total of 4 mg of Versed and 200 mcg of fentanyl were administered intravenously for moderate conscious sedation monitored under my direct supervision. Total intraservice time of sedation was 87 minutes. The patient's vital signs were monitored throughout the procedure and recorded in the patient's medical record by the nurse. FLUOROSCOPY: Radiation Exposure Index (as provided by the fluoroscopic device): 4438 mGy Kerma PSD. COMPLICATIONS: None immediate. TECHNIQUE: Informed written consent was obtained from the patient after a thorough discussion of the procedural risks, benefits and alternatives. All questions were addressed. Maximal Sterile Barrier Technique was  utilized including caps, mask, sterile gowns, sterile gloves, sterile drape, hand hygiene and skin antiseptic. A timeout was performed prior to the initiation of the procedure. The patient was placed in prone position on the angiography table. The thoracolumbar region was prepped and draped in a sterile fashion. Under fluoroscopy, the T11 vertebral body was delineated and the skin area was marked. The skin was infiltrated with a 1% Lidocaine  approximately 3.3 cm lateral to the spinous process projection on the right. Using a 22-gauge spinal needle, the soft issue and the peripedicular space and periosteum were infiltrated with Bupivacaine 0.5%. A skin incision was made at the access site. Subsequently, an 11-gauge Kyphon trocar was inserted under fluoroscopic guidance until contact with the pedicle was obtained. The trocar was inserted into the pedicle with light hammer tapping until the posterior boundaries of the vertebral body was reached. The diamond mandrill was removed and one core biopsy was obtained. The skin was infiltrated with a 1% Lidocaine approximately 3.2 cm lateral to the spinous process projection on the left. Using a 22-gauge spinal needle, the soft issue and the peripedicular space and periosteum were infiltrated with Bupivacaine 0.5%. A skin incision was made at the access site. Subsequently, an 11-gauge Kyphon trocar was inserted under fluoroscopic guidance until contact with the pedicle was obtained. The trocar was inserted with light hammer tapping into the pedicle until the posterior boundaries of the vertebral body was reached. A bone drill was coaxially advanced within the anterior third of the vertebral body on each side for proper RF probes size selection. An OsteoCool RF probe was inserted bilaterally within the mid-posterior third of the vertebral body. The radiofrequency ablation cycle was performed. Thereafter, the RF probes were exchanged for inflatable Kyphon balloons, which were centered within the mid-aspect of the vertebral body. The balloons were inflated to create a void to serve as a repository for the bone cement. Both balloons were deflated and through both cannulas, under continuous fluoroscopy guidance in the AP and lateral views, the vertebral body was filled with previously mixed polymethyl-methacrylate (PMMA) added to barium for opacification. Both cannulas were later removed. Next, the L1 vertebral body  was delineated under fluoroscopy and the skin area was marked. The skin was infiltrated with a 1% Lidocaine approximately 4.5 cm lateral to the spinous process projection on the right. Using a 22-gauge spinal needle, the soft issue and the peripedicular space and periosteum were infiltrated with Bupivacaine 0.5%. A skin incision was made at the access site. Subsequently, an 11-gauge Kyphon trocar was inserted under fluoroscopic guidance until contact with the pedicle was obtained. The trocar was inserted into the pedicle with light hammer tapping until the posterior boundaries of the vertebral body was reached. The diamond mandrill was removed and one core biopsy was obtained. The skin was infiltrated with a 1% Lidocaine approximately 4.5 cm lateral to the spinous process projection on the left. Using a 22-gauge spinal needle, the soft issue and the peripedicular space and periosteum were infiltrated with Bupivacaine 0.5%. A skin incision was made at the access site. Subsequently, an 11-gauge Kyphon trocar was inserted under fluoroscopic guidance until contact with the pedicle was obtained. The trocar was inserted with light hammer tapping into the pedicle until the posterior boundaries of the vertebral body was reached. A bone drill was coaxially advanced within the anterior third of the vertebral body on each side for proper RF probes size selection. An OsteoCool RF probe was inserted bilaterally within the mid-posterior third of the  vertebral body. The radiofrequency ablation cycle was performed. Thereafter, the RF probes were exchanged for inflatable Kyphon balloons, which were centered within the mid-aspect of the vertebral body. The balloons were inflated to create a void to serve as a repository for the bone cement. Both balloons were deflated and through both cannulas, under continuous fluoroscopy guidance in the AP and lateral views, the vertebral body was filled with previously mixed  polymethyl-methacrylate (PMMA) added to barium for opacification. Both cannulas were later removed. The skin was cleaned and access sites were covered with sterile bandages. FINDINGS: Pathologic fractures of the T11 and L1 vertebral bodies, as seen on prior cross-sectional imaging. IMPRESSION: Successful and uncomplicated core bone biopsy of the T11 and L1 vertebral bodies followed by radiofrequency ablation and balloon kyphoplasty for treatment of symptomatic pathologic fractures. Electronically Signed   By: Pedro Earls M.D.   On: 06/20/2021 13:38   IR Bone Tumor(s)RF Ablation  Result Date: 06/20/2021 INDICATION: Raymond Henderson is a 71 year old male with no significant past medical history who presented to ED on 06/17/21 due to back pain. 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 Patient rates pain as 9-10/10. She comes to our service for a fluoroscopy guided core bone biopsy, radiofrequency ablation and balloon kyphoplasty. EXAM: FLUOROSCOPY GUIDED T11 AND L1 CORE BONE BIOPSY, RADIOFREQUENCY ABLATION AND BALLOON KYPHOPLASTY COMPARISON:  None Available. MEDICATIONS: Vancomycin 1 g IV; The antibiotic was administered in an appropriate time interval prior to needle puncture of the skin. ANESTHESIA/SEDATION: A total of 4 mg of Versed and 200 mcg of fentanyl were administered intravenously for moderate conscious sedation monitored under my direct supervision. Total intraservice time of sedation was 87 minutes. The patient's vital signs were monitored throughout the procedure and recorded in the patient's medical record by the nurse. FLUOROSCOPY: Radiation Exposure Index (as provided by the fluoroscopic device): 4438 mGy Kerma PSD. COMPLICATIONS: None immediate. TECHNIQUE: Informed written consent was obtained from the patient after a thorough discussion of the procedural  risks, benefits and alternatives. All questions were addressed. Maximal Sterile Barrier Technique was utilized including caps, mask, sterile gowns, sterile gloves, sterile drape, hand hygiene and skin antiseptic. A timeout was performed prior to the initiation of the procedure. The patient was placed in prone position on the angiography table. The thoracolumbar region was prepped and draped in a sterile fashion. Under fluoroscopy, the T11 vertebral body was delineated and the skin area was marked. The skin was infiltrated with a 1% Lidocaine approximately 3.3 cm lateral to the spinous process projection on the right. Using a 22-gauge spinal needle, the soft issue and the peripedicular space and periosteum were infiltrated with Bupivacaine 0.5%. A skin incision was made at the access site. Subsequently, an 11-gauge Kyphon trocar was inserted under fluoroscopic guidance until contact with the pedicle was obtained. The trocar was inserted into the pedicle with light hammer tapping until the posterior boundaries of the vertebral body was reached. The diamond mandrill was removed and one core biopsy was obtained. The skin was infiltrated with a 1% Lidocaine approximately 3.2 cm lateral to the spinous process projection on the left. Using a 22-gauge spinal needle, the soft issue and the peripedicular space and periosteum were infiltrated with Bupivacaine 0.5%. A skin incision was made at the access site. Subsequently, an 11-gauge Kyphon trocar was inserted under fluoroscopic guidance until contact with the pedicle was obtained. The  trocar was inserted with light hammer tapping into the pedicle until the posterior boundaries of the vertebral body was reached. A bone drill was coaxially advanced within the anterior third of the vertebral body on each side for proper RF probes size selection. An OsteoCool RF probe was inserted bilaterally within the mid-posterior third of the vertebral body. The radiofrequency ablation cycle  was performed. Thereafter, the RF probes were exchanged for inflatable Kyphon balloons, which were centered within the mid-aspect of the vertebral body. The balloons were inflated to create a void to serve as a repository for the bone cement. Both balloons were deflated and through both cannulas, under continuous fluoroscopy guidance in the AP and lateral views, the vertebral body was filled with previously mixed polymethyl-methacrylate (PMMA) added to barium for opacification. Both cannulas were later removed. Next, the L1 vertebral body was delineated under fluoroscopy and the skin area was marked. The skin was infiltrated with a 1% Lidocaine approximately 4.5 cm lateral to the spinous process projection on the right. Using a 22-gauge spinal needle, the soft issue and the peripedicular space and periosteum were infiltrated with Bupivacaine 0.5%. A skin incision was made at the access site. Subsequently, an 11-gauge Kyphon trocar was inserted under fluoroscopic guidance until contact with the pedicle was obtained. The trocar was inserted into the pedicle with light hammer tapping until the posterior boundaries of the vertebral body was reached. The diamond mandrill was removed and one core biopsy was obtained. The skin was infiltrated with a 1% Lidocaine approximately 4.5 cm lateral to the spinous process projection on the left. Using a 22-gauge spinal needle, the soft issue and the peripedicular space and periosteum were infiltrated with Bupivacaine 0.5%. A skin incision was made at the access site. Subsequently, an 11-gauge Kyphon trocar was inserted under fluoroscopic guidance until contact with the pedicle was obtained. The trocar was inserted with light hammer tapping into the pedicle until the posterior boundaries of the vertebral body was reached. A bone drill was coaxially advanced within the anterior third of the vertebral body on each side for proper RF probes size selection. An OsteoCool RF probe was  inserted bilaterally within the mid-posterior third of the vertebral body. The radiofrequency ablation cycle was performed. Thereafter, the RF probes were exchanged for inflatable Kyphon balloons, which were centered within the mid-aspect of the vertebral body. The balloons were inflated to create a void to serve as a repository for the bone cement. Both balloons were deflated and through both cannulas, under continuous fluoroscopy guidance in the AP and lateral views, the vertebral body was filled with previously mixed polymethyl-methacrylate (PMMA) added to barium for opacification. Both cannulas were later removed. The skin was cleaned and access sites were covered with sterile bandages. FINDINGS: Pathologic fractures of the T11 and L1 vertebral bodies, as seen on prior cross-sectional imaging. IMPRESSION: Successful and uncomplicated core bone biopsy of the T11 and L1 vertebral bodies followed by radiofrequency ablation and balloon kyphoplasty for treatment of symptomatic pathologic fractures. Electronically Signed   By: Pedro Earls M.D.   On: 06/20/2021 13:38   IR Bone Tumor(s)RF Ablation  Result Date: 06/20/2021 INDICATION: Raymond Henderson is a 71 year old male with no significant past medical history who presented to ED on 06/17/21 due to back pain. 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 Patient rates pain as 9-10/10. She comes to our service for  a fluoroscopy guided core bone biopsy, radiofrequency ablation and balloon kyphoplasty. EXAM: FLUOROSCOPY GUIDED T11 AND L1 CORE BONE BIOPSY, RADIOFREQUENCY ABLATION AND BALLOON KYPHOPLASTY COMPARISON:  None Available. MEDICATIONS: Vancomycin 1 g IV; The antibiotic was administered in an appropriate time interval prior to needle puncture of the skin. ANESTHESIA/SEDATION: A total of 4 mg of Versed and 200 mcg of fentanyl  were administered intravenously for moderate conscious sedation monitored under my direct supervision. Total intraservice time of sedation was 87 minutes. The patient's vital signs were monitored throughout the procedure and recorded in the patient's medical record by the nurse. FLUOROSCOPY: Radiation Exposure Index (as provided by the fluoroscopic device): 4438 mGy Kerma PSD. COMPLICATIONS: None immediate. TECHNIQUE: Informed written consent was obtained from the patient after a thorough discussion of the procedural risks, benefits and alternatives. All questions were addressed. Maximal Sterile Barrier Technique was utilized including caps, mask, sterile gowns, sterile gloves, sterile drape, hand hygiene and skin antiseptic. A timeout was performed prior to the initiation of the procedure. The patient was placed in prone position on the angiography table. The thoracolumbar region was prepped and draped in a sterile fashion. Under fluoroscopy, the T11 vertebral body was delineated and the skin area was marked. The skin was infiltrated with a 1% Lidocaine approximately 3.3 cm lateral to the spinous process projection on the right. Using a 22-gauge spinal needle, the soft issue and the peripedicular space and periosteum were infiltrated with Bupivacaine 0.5%. A skin incision was made at the access site. Subsequently, an 11-gauge Kyphon trocar was inserted under fluoroscopic guidance until contact with the pedicle was obtained. The trocar was inserted into the pedicle with light hammer tapping until the posterior boundaries of the vertebral body was reached. The diamond mandrill was removed and one core biopsy was obtained. The skin was infiltrated with a 1% Lidocaine approximately 3.2 cm lateral to the spinous process projection on the left. Using a 22-gauge spinal needle, the soft issue and the peripedicular space and periosteum were infiltrated with Bupivacaine 0.5%. A skin incision was made at the access site.  Subsequently, an 11-gauge Kyphon trocar was inserted under fluoroscopic guidance until contact with the pedicle was obtained. The trocar was inserted with light hammer tapping into the pedicle until the posterior boundaries of the vertebral body was reached. A bone drill was coaxially advanced within the anterior third of the vertebral body on each side for proper RF probes size selection. An OsteoCool RF probe was inserted bilaterally within the mid-posterior third of the vertebral body. The radiofrequency ablation cycle was performed. Thereafter, the RF probes were exchanged for inflatable Kyphon balloons, which were centered within the mid-aspect of the vertebral body. The balloons were inflated to create a void to serve as a repository for the bone cement. Both balloons were deflated and through both cannulas, under continuous fluoroscopy guidance in the AP and lateral views, the vertebral body was filled with previously mixed polymethyl-methacrylate (PMMA) added to barium for opacification. Both cannulas were later removed. Next, the L1 vertebral body was delineated under fluoroscopy and the skin area was marked. The skin was infiltrated with a 1% Lidocaine approximately 4.5 cm lateral to the spinous process projection on the right. Using a 22-gauge spinal needle, the soft issue and the peripedicular space and periosteum were infiltrated with Bupivacaine 0.5%. A skin incision was made at the access site. Subsequently, an 11-gauge Kyphon trocar was inserted under fluoroscopic guidance until contact with the pedicle was obtained. The trocar was inserted into the  pedicle with light hammer tapping until the posterior boundaries of the vertebral body was reached. The diamond mandrill was removed and one core biopsy was obtained. The skin was infiltrated with a 1% Lidocaine approximately 4.5 cm lateral to the spinous process projection on the left. Using a 22-gauge spinal needle, the soft issue and the peripedicular  space and periosteum were infiltrated with Bupivacaine 0.5%. A skin incision was made at the access site. Subsequently, an 11-gauge Kyphon trocar was inserted under fluoroscopic guidance until contact with the pedicle was obtained. The trocar was inserted with light hammer tapping into the pedicle until the posterior boundaries of the vertebral body was reached. A bone drill was coaxially advanced within the anterior third of the vertebral body on each side for proper RF probes size selection. An OsteoCool RF probe was inserted bilaterally within the mid-posterior third of the vertebral body. The radiofrequency ablation cycle was performed. Thereafter, the RF probes were exchanged for inflatable Kyphon balloons, which were centered within the mid-aspect of the vertebral body. The balloons were inflated to create a void to serve as a repository for the bone cement. Both balloons were deflated and through both cannulas, under continuous fluoroscopy guidance in the AP and lateral views, the vertebral body was filled with previously mixed polymethyl-methacrylate (PMMA) added to barium for opacification. Both cannulas were later removed. The skin was cleaned and access sites were covered with sterile bandages. FINDINGS: Pathologic fractures of the T11 and L1 vertebral bodies, as seen on prior cross-sectional imaging. IMPRESSION: Successful and uncomplicated core bone biopsy of the T11 and L1 vertebral bodies followed by radiofrequency ablation and balloon kyphoplasty for treatment of symptomatic pathologic fractures. Electronically Signed   By: Pedro Earls M.D.   On: 06/20/2021 13:38   IR KYPHO EA ADDL LEVEL THORACIC OR LUMBAR  Result Date: 06/20/2021 INDICATION: Raymond Henderson is a 71 year old male with no significant past medical history who presented to ED on 06/17/21 due to back pain. MRI of the thoracic and lumbar spine showed widespread osseous metastatic diease with pathologic fractures of T 11 and  L1 with height loss, corresponding to painful levels. Patient rates pain as 9-10/10. She comes to our service for a fluoroscopy guided core bone biopsy, radiofrequency ablation and balloon kyphoplasty. EXAM: FLUOROSCOPY GUIDED T11 AND L1 CORE BONE BIOPSY, RADIOFREQUENCY ABLATION AND BALLOON KYPHOPLASTY COMPARISON:  None Available. MEDICATIONS: Vancomycin 1 g IV; The antibiotic was administered in an appropriate time interval prior to needle puncture of the skin. ANESTHESIA/SEDATION: A total of 4 mg of Versed and 200 mcg of fentanyl were administered intravenously for moderate conscious sedation monitored under my direct supervision. Total intraservice time of sedation was 87 minutes. The patient's vital signs were monitored throughout the procedure and recorded in the patient's medical record by the nurse. FLUOROSCOPY: Radiation Exposure Index (as provided by the fluoroscopic device): 4438 mGy Kerma PSD. COMPLICATIONS: None immediate. TECHNIQUE: Informed written consent was obtained from the patient after a thorough discussion of the procedural risks, benefits and alternatives. All questions were addressed. Maximal Sterile Barrier Technique was utilized including caps, mask, sterile gowns, sterile gloves, sterile drape, hand hygiene and skin antiseptic. A timeout was performed prior to the initiation of the procedure. The patient was placed in prone position on the angiography table. The thoracolumbar region was prepped and draped in a sterile fashion. Under fluoroscopy, the T11 vertebral body was delineated and the skin area was marked. The skin was infiltrated with a 1% Lidocaine approximately 3.3 cm  lateral to the spinous process projection on the right. Using a 22-gauge spinal needle, the soft issue and the peripedicular space and periosteum were infiltrated with Bupivacaine 0.5%. A skin incision was made at the access site. Subsequently, an 11-gauge Kyphon trocar was inserted under fluoroscopic guidance until  contact with the pedicle was obtained. The trocar was inserted into the pedicle with light hammer tapping until the posterior boundaries of the vertebral body was reached. The diamond mandrill was removed and one core biopsy was obtained. The skin was infiltrated with a 1% Lidocaine approximately 3.2 cm lateral to the spinous process projection on the left. Using a 22-gauge spinal needle, the soft issue and the peripedicular space and periosteum were infiltrated with Bupivacaine 0.5%. A skin incision was made at the access site. Subsequently, an 11-gauge Kyphon trocar was inserted under fluoroscopic guidance until contact with the pedicle was obtained. The trocar was inserted with light hammer tapping into the pedicle until the posterior boundaries of the vertebral body was reached. A bone drill was coaxially advanced within the anterior third of the vertebral body on each side for proper RF probes size selection. An OsteoCool RF probe was inserted bilaterally within the mid-posterior third of the vertebral body. The radiofrequency ablation cycle was performed. Thereafter, the RF probes were exchanged for inflatable Kyphon balloons, which were centered within the mid-aspect of the vertebral body. The balloons were inflated to create a void to serve as a repository for the bone cement. Both balloons were deflated and through both cannulas, under continuous fluoroscopy guidance in the AP and lateral views, the vertebral body was filled with previously mixed polymethyl-methacrylate (PMMA) added to barium for opacification. Both cannulas were later removed. Next, the L1 vertebral body was delineated under fluoroscopy and the skin area was marked. The skin was infiltrated with a 1% Lidocaine approximately 4.5 cm lateral to the spinous process projection on the right. Using a 22-gauge spinal needle, the soft issue and the peripedicular space and periosteum were infiltrated with Bupivacaine 0.5%. A skin incision was made  at the access site. Subsequently, an 11-gauge Kyphon trocar was inserted under fluoroscopic guidance until contact with the pedicle was obtained. The trocar was inserted into the pedicle with light hammer tapping until the posterior boundaries of the vertebral body was reached. The diamond mandrill was removed and one core biopsy was obtained. The skin was infiltrated with a 1% Lidocaine approximately 4.5 cm lateral to the spinous process projection on the left. Using a 22-gauge spinal needle, the soft issue and the peripedicular space and periosteum were infiltrated with Bupivacaine 0.5%. A skin incision was made at the access site. Subsequently, an 11-gauge Kyphon trocar was inserted under fluoroscopic guidance until contact with the pedicle was obtained. The trocar was inserted with light hammer tapping into the pedicle until the posterior boundaries of the vertebral body was reached. A bone drill was coaxially advanced within the anterior third of the vertebral body on each side for proper RF probes size selection. An OsteoCool RF probe was inserted bilaterally within the mid-posterior third of the vertebral body. The radiofrequency ablation cycle was performed. Thereafter, the RF probes were exchanged for inflatable Kyphon balloons, which were centered within the mid-aspect of the vertebral body. The balloons were inflated to create a void to serve as a repository for the bone cement. Both balloons were deflated and through both cannulas, under continuous fluoroscopy guidance in the AP and lateral views, the vertebral body was filled with previously mixed polymethyl-methacrylate (PMMA)  added to barium for opacification. Both cannulas were later removed. The skin was cleaned and access sites were covered with sterile bandages. FINDINGS: Pathologic fractures of the T11 and L1 vertebral bodies, as seen on prior cross-sectional imaging. IMPRESSION: Successful and uncomplicated core bone biopsy of the T11 and L1  vertebral bodies followed by radiofrequency ablation and balloon kyphoplasty for treatment of symptomatic pathologic fractures. Electronically Signed   By: Pedro Earls M.D.   On: 06/20/2021 13:38   US BIOPSY (LIVER)  Result Date: 06/19/2021 INDICATION: 71 year old male with history of ascending colonic mass and multifocal liver masses, presumed metastases. EXAM: ULTRASOUND GUIDED LIVER LESION BIOPSY COMPARISON:  None Available. MEDICATIONS: None ANESTHESIA/SEDATION: Fentanyl 25 mcg IV; Versed 0.5 mg IV Total Moderate Sedation time:  10 minutes. The patient's level of consciousness and vital signs were monitored continuously by radiology nursing throughout the procedure under my direct supervision. COMPLICATIONS: None immediate. PROCEDURE: Informed written consent was obtained from the patient after a discussion of the risks, benefits and alternatives to treatment. The patient understands and consents the procedure. A timeout was performed prior to the initiation of the procedure. Ultrasound scanning was performed of the right upper abdominal quadrant demonstrates hypoechoic mass in the left lobe of the liver with anterior hepatic capsular bulging. Left lobe liver mass was selected for biopsy and the procedure was planned. The right upper abdominal quadrant was prepped and draped in the usual sterile fashion. The overlying soft tissues were anesthetized with 1% lidocaine with epinephrine. A 17 gauge, 6.8 cm co-axial needle was advanced into a peripheral aspect of the lesion. This was followed by 3 core biopsies with an 18 gauge core device under direct ultrasound guidance. The coaxial needle tract was embolized with a small amount of Gel-Foam slurry and superficial hemostasis was obtained with manual compression. Post procedural scanning was negative for definitive area of hemorrhage or additional complication. A dressing was placed. The patient tolerated the procedure well without  immediate post procedural complication. IMPRESSION: Technically successful ultrasound guided core needle biopsy of left lobe liver mass. Ruthann Cancer, MD [facility] Electronically Signed   By: Ruthann Cancer M.D.   On: 06/19/2021 17:28   CT CHEST ABDOMEN PELVIS W CONTRAST  Result Date: 06/18/2021 CLINICAL DATA:  Metastatic disease.  Staging. EXAM: CT CHEST, ABDOMEN, AND PELVIS WITH CONTRAST TECHNIQUE: Multidetector CT imaging of the chest, abdomen and pelvis was performed following the standard protocol during bolus administration of intravenous contrast. RADIATION DOSE REDUCTION: This exam was performed according to the departmental dose-optimization program which includes automated exposure control, adjustment of the mA and/or kV according to patient size and/or use of iterative reconstruction technique. CONTRAST:  174m OMNIPAQUE IOHEXOL 300 MG/ML  SOLN COMPARISON:  MRI thoracic and lumbar spine 06/18/2021 FINDINGS: CT CHEST FINDINGS Cardiovascular: The heart size appears within normal limits. No pericardial effusion identified. Aortic atherosclerosis and coronary artery calcifications. Mediastinum/Nodes: Thyroid gland, trachea and esophagus are unremarkable. Subcarinal lymph node measures 1.5 cm, image 23/3. No enlarged axillary, supraclavicular, or hilar lymph nodes. Lungs/Pleura: No pleural effusion. Mild changes of emphysema. No suspicious pulmonary nodule or mass. Musculoskeletal: Multifocal lytic bone metastases identified involving the ribs, sternum and spine. Index lesions include: -expansile lesion with associated pathologic fracture involves the anterolateral aspect of the right second rib measuring 2.1 cm, image 19/5. -lesion involving the sternal manubrium measures 0.9 cm, 26/5. -lesion involving the T11 vertebra measures 3.6 cm and extends into the right posterior elements,  image 125/5. Associated pathologic compression fracture is noted  with loss of approximately 25% of the vertebral body height. (See thoracic spine MRI for further details regarding the thoracic spine metastasis.) CT ABDOMEN PELVIS FINDINGS Hepatobiliary: Diffuse liver metastases are identified throughout both lobes of the liver. Lesions are too numerous to count. Index lesions include: -segment 2 lesion measuring 2.4 x 2.0 cm, image 44/3. -partially exophytic lesion involving segment 6 measures 2.1 x 1.7 cm, image 62/3. -segment 4B lesion measures 2.1 x 1.4 cm, image 5/3. The gallbladder appears surgically absent. Pancreas: Unremarkable. No pancreatic ductal dilatation or surrounding inflammatory changes. Spleen: Normal in size without focal abnormality. Adrenals/Urinary Tract: Adrenal glands are unremarkable. Kidneys are normal, without renal calculi, focal lesion, or hydronephrosis. Bladder is unremarkable. Stomach/Bowel: Stomach appears normal. No signs of small bowel wall thickening, inflammation, or distension. Circumferential mass is identified involving the mid to distal ascending colon measures 4.4 x 5.2 by 8.9 cm, image 67/3. There is significant luminal narrowing involving the affected segment without signs of bowel obstruction. Transmural extension of tumor is identified with increased pericolonic soft tissue infiltration. Vascular/Lymphatic: Aortic atherosclerosis without aneurysm. Upper abdominal adenopathy is identified: -portacaval lymph node measures 2.3 cm short axis, image 53/3. -porta hepatic lymph node measures 2.3 cm, image 53/6. -ileocolic mesenteric lymph node measures 2.9 cm, image 70/3. No pelvic or inguinal adenopathy. Reproductive: Mild prostate gland enlargement. Other: No ascites. Peritoneal nodule within the left hemiabdomen is identified measuring 8 mm, image 70/3. Musculoskeletal: Multifocal lytic bone metastases identified involving the lumbar spine and bony pelvis. Index lesions include: -L2 vertebral body lesion measures 2.7 cm, image 72/7. -left  inferior pubic rami lesion measures 1.5 cm, image 130/3. -right acetabular lesion measures 0.9 cm, image 111/3. Bilateral proximal femur lesions are identified: -Lesion involving the lesser trochanter of the proximal left femur measures 1.7 cm, image 127/3. -intertrochanteric lesion involving the proximal right femur measures 2.3 cm, image 126/3. IMPRESSION: 1. 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 Transmural extension of tumor is identified with increased pericolonic soft tissue infiltration. There is significant luminal narrowing involving the affected segment of colon without signs of high-grade bowel obstruction. 2. Extensive liver metastases. 3. Upper abdominal and mesenteric adenopathy compatible with metastatic adenopathy. Enlarged subcarinal lymph node compatible with metastatic disease. Please see report from MRI of the thoracic and lumbar spine, also performed today. 4. There are lytic lesions involving bilateral proximal femurs which may be of orthopedic significance. 5. Coronary artery calcifications noted. 6. Aortic Atherosclerosis (ICD10-I70.0) and Emphysema (ICD10-J43.9). Electronically Signed   By: Kerby Moors M.D.   On: 06/18/2021 05:36   MR Lumbar Spine W Wo Contrast  Result Date: 06/18/2021 CLINICAL DATA:  Initial evaluation for trauma, severe back pain. EXAM: MRI LUMBAR SPINE WITHOUT AND WITH CONTRAST TECHNIQUE: Multiplanar and multiecho pulse sequences of the lumbar spine were obtained without and with intravenous contrast. CONTRAST:  8.9m GADAVIST GADOBUTROL 1 MMOL/ML IV SOLN COMPARISON:  None Available. FINDINGS: Segmentation: Standard. Lowest well-formed disc space labeled the L5-S1 level. Alignment: Physiologic with preservation of the normal lumbar lordosis. No listhesis. Vertebrae: Widespread osseous metastatic disease seen throughout the lumbar spine as well as the visualized sacrum and pelvis. There is involvement of essentially all levels. Scattered  posterior element involvement seen at numerable levels as well. Associated pathologic fracture at L1 with mild 20% height loss. Evidence for early epidural extension of tumor into the ventral epidural space at L1 and L5 (series 27, images 9, 7). Conus medullaris and cauda  equina: Conus extends to the L1 level. Conus and cauda equina appear normal. No abnormal intramedullary enhancement. Paraspinal and other soft tissues: Mild extraosseous extension of tumor with involvement of the adjacent paraspinous soft tissues noted at multiple levels, most notable at the right transverse process of L3 (series 32, image 19). Scattered edema within the lower posterior paraspinous musculature likely reactive. Enlarged aortocaval lymph nodes measuring up to 1.5 cm in short axis, concerning for nodal metastatic disease (series 29, images 5, 10). Disc levels: T12-L1: Pathologic compression fracture of L1 with mild epidural extension of tumor. Secondary flattening of the ventral thecal sac, eccentric to the left. No significant spinal stenosis at this time. Foramina remain patent. L1-2:  Unremarkable. L2-3: Small left extraforaminal disc protrusion with annular fissure. Mild facet hypertrophy. No stenosis. L3-4: Disc desiccation with mild disc bulge. Mild facet hypertrophy. No stenosis. L4-5: Disc desiccation with mild disc bulge. Mild to moderate facet hypertrophy. No significant stenosis. L5-S1: Degenerative intervertebral disc space narrowing with diffuse disc bulge and reactive endplate change. Epidural extension of tumor into the right ventral epidural space (series 29, image 33). Mild to moderate bilateral facet hypertrophy. Mild narrowing of the right lateral recess without significant spinal stenosis. Mild left L5 foraminal narrowing. Right neural foramen remains patent. IMPRESSION: 1. Widespread osseous metastatic disease throughout the lumbar spine as well as the visualized sacrum and pelvis. 2. Associated pathologic  fracture of L1 with mild 20% height loss. Evidence for early epidural extension of tumor into the ventral epidural space at L1 and L5. No significant spinal stenosis at this time. 3. Enlarged aortocaval lymph nodes, concerning for nodal metastatic disease. 4. Underlying mild multilevel degenerative spondylosis as above, most pronounced at L5-S1. Electronically Signed   By: Jeannine Boga M.D.   On: 06/18/2021 01:16   MR THORACIC SPINE W WO CONTRAST  Result Date: 06/18/2021 CLINICAL DATA:  Initial evaluation for acute back pain, trauma. EXAM: MRI THORACIC WITHOUT AND WITH CONTRAST TECHNIQUE: Multiplanar and multiecho pulse sequences of the thoracic spine were obtained without and with intravenous contrast. CONTRAST:  8.2m GADAVIST GADOBUTROL 1 MMOL/ML IV SOLN COMPARISON:  None Available. FINDINGS: Alignment: Physiologic with preservation of the normal thoracic kyphosis. Vertebrae: There are innumerable T1 hypointense, stir hyperintense enhancing lesions seen throughout the thoracic spine, consistent with widespread osseous metastatic disease. There is involvement of essentially all levels. Posterior elements are involved at numerous levels as well. Associated pathologic fracture at T11 with up to 35% height loss. Evidence for early epidural extension into the ventral epidural space at T7, T9, and T11. Prominent extra osseous extension with soft tissue component seen about the left rib and transverse process of T7, measuring approximately 3.3 x 2.4 cm (series 20, image 20). Multiple lesion seen involving the partially visualized posterior ribs as well. No significant or high-grade spinal stenosis at this time. Cord: Early epidural extension at the levels of T7, T9, and T11. No high-grade spinal stenosis or frank cord impingement. Cord itself is normal in caliber in appearance. No abnormal intramedullary enhancement. Paraspinal and other soft tissues: Extraosseous extension with associated soft tissue  component about the left transverse process of T7 as above. More mild extraosseous extension also noted on the right at T4 and on the left at T9. Innumerable lesions seen throughout the partially visualized liver, likely reflecting metastatic disease. Disc levels: No significant underlying disc pathology. No high-grade spinal stenosis. Mild foraminal narrowing on the right at T6-7 and T11-12 related to extraosseous extension of tumor. Foramina otherwise remain  patent at this time. IMPRESSION: 1. Widespread osseous metastatic disease throughout the thoracic spine as above. 2. Associated pathologic fracture at T11 with up to 35% height loss. 3. Evidence for early epidural extension at the levels of T7, T9, and T11. No high-grade spinal stenosis or frank cord impingement at this time. 4. Innumerable lesions throughout the partially visualized liver, likely reflecting metastatic disease. Electronically Signed   By: Jeannine Boga M.D.   On: 06/18/2021 01:07   DG Chest Portable 1 View  Result Date: 06/17/2021 CLINICAL DATA:  Cough with leukocytosis. EXAM: PORTABLE CHEST 1 VIEW COMPARISON:  None Available. FINDINGS: Multiple overlying radiopaque cardiac lead wires are noted. The heart size and mediastinal contours are within normal limits. Both lungs are clear. An ill-defined lytic area is seen involving the lateral aspect of the second left rib. IMPRESSION: 1. No acute infiltrate or pleural effusion. 2. Ill-defined lytic area involving the lateral aspect of the second left rib. Further evaluation with nonemergent chest CT is recommended. Electronically Signed   By: Virgina Norfolk M.D.   On: 06/17/2021 22:41    Labs:  CBC: Recent Labs    06/18/21 0810 06/19/21 0232 06/20/21 0359 06/21/21 0248  WBC 14.0* 15.0* 16.3* 15.2*  HGB 11.4* 12.4* 11.9* 10.4*  HCT 36.1* 37.7* 36.8* 32.4*  PLT 326 239 332 283    COAGS: Recent Labs    06/18/21 0509  INR 1.1  APTT 33    BMP: Recent Labs     06/18/21 0810 06/19/21 0443 06/20/21 0542 06/21/21 0248  NA 135 134* 133* 133*  K 4.1 4.5 3.9 3.6  CL 98 99 96* 99  CO2 '27 27 25 24  '$ GLUCOSE 106* 123* 137* 102*  BUN '19 20 17 '$ 25*  CALCIUM 10.8* 11.7* 10.7* 9.3  CREATININE 0.87 0.95 0.86 0.85  GFRNONAA >60 >60 >60 >60    LIVER FUNCTION TESTS: Recent Labs    06/18/21 0509 06/18/21 0810 06/19/21 0443 06/20/21 0542 06/21/21 0248  BILITOT 2.0* 1.8* 2.1* 1.7*  --   AST 73* 68* 63* 59*  --   ALT 46* 46* 40 38  --   ALKPHOS 725* 759* 792* 797*  --   PROT 6.7 6.5 6.6 6.6  --   ALBUMIN 2.5* 2.4* 2.5* 2.5* 2.3*    Assessment and Plan:  Vertebral fractures, metastatic origin --1 day s/p osteocool/KP --improved pain --access sites look good  NIR to sign off.  Will remain available for re-consult as needed.  Electronically Signed: Pasty Spillers, PA 06/21/2021, 3:16 PM   I spent a total of 25 Minutes at the the patient's bedside AND on the patient's hospital floor or unit, greater than 50% of which was counseling/coordinating care for kyphoplasty with osteocool treatment.

## 2021-06-21 NOTE — Progress Notes (Signed)
PROGRESS NOTE  Raymond Henderson WLN:[id number] DOB: January 10, 1951   PCP: Kathalene Frames, MD  Patient is from: Home.  Lives alone.  DOA: 06/17/2021 LOS: 3  Chief complaints Chief Complaint  Patient presents with   Fall   Back Pain     Brief Narrative / Interim history: 71 year old M with no known PMH presented to ED with back pain for about 5 weeks and abdominal pain and constipation for about 10 days and found to have ascending colon mass with liver, vertebral and pelvic lesions as well as T11 and L1 pathologic fracture without significant canal stenosis.  Underwent US-guided liver biopsy on 6/5, and T11 and L1 vertebral biopsy and kyphoplasty on 6/7.  Oncology consulted.   Radiation oncology consulted and started palliative radiation on 6/8.  Oncology and palliative medicine following.   Subjective: Seen and examined this afternoon after her return from radiation treatment.  He is awake but somewhat confused.  He does not recall going to [facility] long hospital for evaluation treatment.  Patient's significant other at the bedside.  He denies pain.  Objective: Vitals:   06/20/21 2003 06/21/21 0016 06/21/21 0845 06/21/21 1329  BP: 134/83 (!) 147/84  (!) 143/79  Pulse: 95 (!) 105 (!) 108 93  Resp: '16 19 17 17  '$ Temp: 97.8 F (36.6 C)  98 F (36.7 C) 98 F (36.7 C)  TempSrc: Oral  Oral Oral  SpO2: 95% 98% 97% 97%  Weight:      Height:        Examination:  GENERAL: No apparent distress.  Nontoxic. HEENT: MMM.  Vision and hearing grossly intact.  NECK: Supple.  No apparent JVD.  RESP:  No IWOB.  Fair aeration bilaterally. CVS:  RRR. Heart sounds normal.  ABD/GI/GU: BS+. Abd soft, NTND.  MSK/EXT:  Moves extremities. No apparent deformity. No edema.  SKIN: no apparent skin lesion or wound NEURO: Awake but falls asleep quickly.  Oriented to self and person.  No apparent focal neuro deficit. PSYCH: Calm. Normal affect.   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  Imaging revealed ascending colon mass with liver and vertebral lesions as well as T11 and L1 pathologic fractures. -S/p US guided liver biopsy on 6/5.  Pathology with poorly differentiated adenocarcinoma -S/p T11 and L1 biopsy as well as kyphoplasty on 6/7 -Simulation and palliative radiation 6/8>> -Pain control with fentanyl patch, oxycodone and IV Dilaudid -Bowel regimen  Pathologic T11 and L1 fracture S/p image guided biopsy and kyphoplasty on 6/7. Simulation and palliative radiation 6/8>> Pain control as above  Hypercalcemia Likely due to malignancy.  Seems to have improved today. -Resolved with IV fluid.  Normocytic anemia Recent Labs    06/17/21 2138 06/18/21 0810 06/19/21 0232 06/20/21 0359 06/21/21 0248  HGB 11.4* 11.4* 12.4* 11.9* 10.4*  Likely anemia of chronic disease.  Slight drop for likely from IV fluid. -Monitor   Goals of care, counseling/discussion See discussion by previous attending.  Goal status changed to DNR/DNI.  Palliative medicine consulted.  Toxic encephalopathy Likely from pain medications.  However, pain control is very essential -Reorientation and delirium precautions -Fall precautions  Elevated alkaline phosphatase level In the setting of osseous metastasis. -Continue monitoring  Hyponatremia Stable.  Continue monitoring  Colonic mass Concerning for malignancy.   DVT prophylaxis:  heparin injection 5,000 Units Start: 06/20/21 2200  SCDs Start: 06/18/21 0749  Code Status: DNR/DNI Family Communication: Updated patient's significant other at bedside. Level of care: Med-Surg Status is: Inpatient Remains inpatient appropriate because: Due to possible metastatic cancer with pathologic fracture requiring pain control   Final disposition:  TBD Consultants:  Oncology Palliative medicine  Sch Meds:  Scheduled Meds:  bisacodyl  10 mg Rectal BID   feeding supplement  237 mL Oral TID BM   fentaNYL  1 patch Transdermal Q72H   heparin injection (subcutaneous)  5,000 Units Subcutaneous Q8H   senna-docusate  2 tablet Oral BID   Continuous Infusions:  potassium PHOSPHATE IVPB (in mmol) Stopped (06/21/21 0925)   PRN Meds:.acetaminophen **OR** acetaminophen, HYDROmorphone (DILAUDID) injection, iohexol, ondansetron **OR** ondansetron (ZOFRAN) IV, oxyCODONE, polyethylene glycol  Antimicrobials: Anti-infectives (From admission, onward)    Start     Dose/Rate Route Frequency Ordered Stop   06/20/21 1045  vancomycin (VANCOCIN) IVPB 1000 mg/200 mL premix        1,000 mg 200 mL/hr over 60 Minutes Intravenous To Radiology 06/20/21 0950 06/20/21 1316        I have personally reviewed the following labs and images: CBC: Recent Labs  Lab 06/17/21 2138 06/18/21 0810 06/19/21 0232 06/20/21 0359 06/21/21 0248  WBC 17.0* 14.0* 15.0* 16.3* 15.2*  NEUTROABS  --  10.8* 12.1*  --   --   HGB 11.4* 11.4* 12.4* 11.9* 10.4*  HCT 36.4* 36.1* 37.7* 36.8* 32.4*  MCV 83.3 83.6 81.6 82.1 82.7  PLT 346 326 239 332 283   BMP &GFR Recent Labs  Lab 06/17/21 2138 06/18/21 0810 06/19/21 0443 06/20/21 0542 06/21/21 0248  NA 132* 135 134* 133* 133*  K 4.0 4.1 4.5 3.9 3.6  CL 96* 98 99 96* 99  CO2 '25 27 27 25 24  '$ GLUCOSE 110* 106* 123* 137* 102*  BUN 24* '19 20 17 '$ 25*  CREATININE 1.01 0.87 0.95 0.86 0.85  CALCIUM 10.5* 10.8* 11.7* 10.7* 9.3  MG  --   --  2.3  --  2.0  PHOS  --   --   --   --  2.1*   Estimated Creatinine Clearance: 78.6 mL/min (by C-G formula based on SCr of 0.85 mg/dL). Liver & Pancreas: Recent Labs  Lab 06/18/21 0509 06/18/21 0810 06/19/21 0443 06/20/21 0542 06/21/21 0248  AST 73* 68* 63* 59*  --   ALT 46* 46* 40 38  --   ALKPHOS 725* 759* 792* 797*  --   BILITOT 2.0* 1.8* 2.1* 1.7*  --   PROT 6.7 6.5 6.6  6.6  --   ALBUMIN 2.5* 2.4* 2.5* 2.5* 2.3*   No results for input(s): "LIPASE", "AMYLASE" in the last 168 hours. No results for input(s): "AMMONIA" in the last 168 hours. Diabetic: No results for input(s): "HGBA1C" in the last 72 hours. No results for input(s): "GLUCAP" in the last 168 hours. Cardiac Enzymes: No results for input(s): "CKTOTAL", "CKMB", "CKMBINDEX", "TROPONINI" in the last 168 hours. No results for input(s): "PROBNP" in the last 8760 hours. Coagulation Profile: Recent Labs  Lab 06/18/21 0509  INR 1.1   Thyroid Function Tests: No results for input(s): "TSH", "T4TOTAL", "FREET4", "T3FREE", "THYROIDAB" in the last 72 hours. Lipid Profile: No results for input(s): "CHOL", "HDL", "LDLCALC", "TRIG", "CHOLHDL", "LDLDIRECT" in the last 72 hours. Anemia Panel: No results for input(s): "VITAMINB12", "FOLATE", "FERRITIN", "TIBC", "IRON", "RETICCTPCT" in the last 72 hours.  Urine analysis:    Component Value Date/Time   COLORURINE YELLOW 06/18/2021 Ramirez-Perez  06/18/2021 0436   LABSPEC 1.012 06/18/2021 0436   PHURINE 7.0 06/18/2021 0436   GLUCOSEU NEGATIVE 06/18/2021 0436   HGBUR NEGATIVE 06/18/2021 0436   BILIRUBINUR NEGATIVE 06/18/2021 0436   KETONESUR NEGATIVE 06/18/2021 0436   PROTEINUR NEGATIVE 06/18/2021 0436   NITRITE NEGATIVE 06/18/2021 0436   LEUKOCYTESUR NEGATIVE 06/18/2021 0436   Sepsis Labs: Invalid input(s): "PROCALCITONIN", "LACTICIDVEN"  Microbiology: Recent Results (from the past 240 hour(s))  Culture, blood (Routine X 2) w Reflex to ID Panel     Status: None (Preliminary result)   Collection Time: 06/18/21  5:09 AM   Specimen: BLOOD LEFT HAND  Result Value Ref Range Status   Specimen Description BLOOD LEFT HAND  Final   Special Requests   Final    AEROBIC BOTTLE ONLY Blood Culture results may not be optimal due to an inadequate volume of blood received in culture bottles   Culture   Final    NO GROWTH 3 DAYS Performed at [facility], [place] [address], [place], [place] [address]    Report Status PENDING  Incomplete  Culture, blood (Routine X 2) w Reflex to ID Panel     Status: None (Preliminary result)   Collection Time: 06/18/21  5:18 AM   Specimen: BLOOD  Result Value Ref Range Status   Specimen Description BLOOD LEFT ANTECUBITAL  Final   Special Requests AEROBIC BOTTLE ONLY Blood Culture adequate volume  Final   Culture   Final    NO GROWTH 3 DAYS Performed at [facility], [place] [address] [place], [place] [address]    Report Status PENDING  Incomplete    Radiology Studies: No results found.    Fines Raymond Henderson  If 7PM-7AM, please contact night-coverage [email or web address] 06/21/2021, 5:22 PM

## 2021-06-21 NOTE — Care Management Important Message (Signed)
Important Message  Patient Details  Name: Raymond Henderson MRN: [id number] Date of Birth: 18-Sep-1950   Medicare Important Message Given:  Yes     Deon Ivey Montine Circle 06/21/2021, 4:46 PM

## 2021-06-21 NOTE — Progress Notes (Signed)
Surprisingly, the biopsy came back already.  I guess I should not be surprised that this is a poorly differentiated adenocarcinoma.  I think this would explain why the CEA is not all that high.  I think he did have his vertebroplasty yesterday.  Hopefully, this will help with his back pain.  He was quite emotional this morning.  He says that he does not want any treatment for his cancer.  I know that he could easily change his mind.  I do think that he will be very hard to treat his malignancy.  I do think that Radiation would not be a bad idea to help with pain.  I did talk to him about Hospice.  I explained what hospice was.  I think he might consider this.  I am not sure he is eating all that much.  I am sending off a prealbumin on him so we can see what his nutritional reserves are.  He has had no cough.  He has had no nausea or vomiting.  He has had no bleeding.  His chemistry studies show BUN 25 creatinine 0.85.  His white cell count is 15.2.  Hemoglobin 10.4.  Platelet count 283,000.  His vital signs show a temperature of 97.8.  Pulse 105.  Blood pressure 147/84.  His lungs are clear.  Cardiac exam regular rate and rhythm.  Abdomen is soft.  Bowel sounds are present.  He has no guarding or rebound tenderness.  Extremity shows no clubbing, cyanosis or edema.  He is now on a air mattress.  I think this might be helping his back a little bit.  Again, we will have to see what his prealbumin is.  I think this will be very important.  I probably will have to talk to him again about Hospice.  Again he might change his mind as to whether or not he wants any treatment.  I still think that radiation would not be a bad idea to his back just to try to help with the pain.  Hopefully, the vertebroplasty has helped.  I realize that this is a tough situation for him.  I do appreciate the incredible care that he is getting from all the staff up on 5[address].  Lattie Haw, MD  [facility]op 6:9

## 2021-06-21 NOTE — Progress Notes (Addendum)
This chaplain is present with the Pt. for Advance Directive: HCPOA and Living Will education.The Pt. is able to ask clarifying questions and is ready to proceed with notarizing his AD.  The notary was called for possible notarizing today. The chaplain understands the Pt. is choosing [facility] as his healthcare agent.  This chaplain is available for F/U spiritual care as needed.  **[id number] The chaplain is present to update the Pt. on the notary process. The Pt. is sleeping. The Pt. significant other-Bonnie is present at the bedside. The notary is not available today. The chaplain will F/U with Horris Latino and the Pt. on Friday. Horris Latino is holding the incomplete AD to review with the Pt.  Chaplain Sallyanne Kuster [phone]

## 2021-06-21 NOTE — Progress Notes (Signed)
Daily Progress Note   Patient Name: Raymond Henderson       Date: 06/21/2021 DOB: 1950/08/24  Age: 71 y.o. MRN#: [id number] Attending Physician: Mercy Riding, MD Primary Care Physician: Kathalene Frames, MD Admit Date: 06/17/2021  Reason for Consultation/Follow-up: Establishing goals of care  Subjective: Medical records reviewed including progress notes, labs. Patient assessed at the bedside.  He has just finished working with PT, requesting help calling Horris Latino.  He is tearful on the phone telling her that he is being sent home today and does not understand why.  I provided clarification and reassurance that he is being transferred to [facility] for radiation simulation, not home.  He tells her that he talked to the oncologist this morning but he is having a hard time recalling the details. He is still very focused on determining an exact prognosis to share with his family and does not feel that he will pursue any treatment other than radiation for pain control.   Advance directives were discussed and Jordynn confirms he would like to name Horris Latino as his Air traffic controller. He thought that he already "gave the ok" and I provided education on the importance of AD documentation with notarization for legal validity. He is agreeable to this PA reaching out to chaplain for completion.  Called Bonnie in the afternoon at her request and discussed poor prognosis with metastatic cancer, provided updates on liver biopsy results showing adenocarcinoma. She reiterates that Nnamdi does not want to call family several times with updates given their strained relationship. He wants to be able to tell them how long he has during the initial conversation and move forward with his next decisions. Emotional support and  therapeutic listening was provided as she reflected on the difficulty she and her family have had in processing Latravious's cancer diagnosis. Sadly, he was just getting settled into work and his apartment for the past few months after many years struggling. Informed her that Dr. Marin Olp will be at the bedside to discuss again tomorrow and she states that she will be there for support and further questions.  Questions and concerns addressed. PMT will continue to support holistically.   Length of Stay: 3   Physical Exam Vitals and nursing note reviewed.  Constitutional:      General: He is not in acute  distress. Cardiovascular:     Rate and Rhythm: Tachycardia present.  Pulmonary:     Effort: Pulmonary effort is normal. No respiratory distress.  Skin:    General: Skin is warm and dry.  Neurological:     Mental Status: He is alert and oriented to person, place, and time.  Psychiatric:        Mood and Affect: Mood normal.            Vital Signs: BP (!) 147/84 (BP Location: Left Arm)   Pulse (!) 108   Temp 98 F (36.7 C) (Oral)   Resp 17   Ht '5\' 9"'$  (1.753 m)   Wt 83.5 kg   SpO2 97%   BMI 27.17 kg/m  SpO2: SpO2: 97 % O2 Device: O2 Device: Room Air O2 Flow Rate: O2 Flow Rate (L/min): 2 L/min      Palliative Assessment/Data: 50%       Palliative Care Assessment & Plan   Patient Profile: 71 y.o. male  with no significant past medical history admitted on 06/17/2021 with severe midthoracic back pain for several weeks.    Patient reports that he last had a colonoscopy approximately 15 years ago in [place] and has not seen a PCP in approximately that many years. Upon arrival to ED, initial work-up identified widespread osseous metastatic disease seen in the lumbar spine sacrum and pelvis, an associated pathologic L1 fracture, liver metastases, and a mass in the ascending colon, likely the primary malignancy.   PMT has been consulted to assist with goals of care  conversation.  Assessment: Metastatic cancer Pathological lumbar and thoracic vertebrae    Recommendations/Plan: DNR Continue current care Patient's friend Horris Latino will be present tomorrow morning for further discussion with oncology Spiritual care consult for HCPOA completion appreciated, patient to name Horris Latino as surrogate decision-maker Psychosocial and emotional support provided PMT will continue to follow   Prognosis:  Poor prognosis with metastatic cancer and functional decline, hospice appropriate if aligned with goals of care  Discharge Planning: To Be Determined   Total time: I spent 50 minutes in the care of the patient today in the above activities and documenting the encounter.          Prentiss Hammett Johnnette Litter, PA-C  Palliative Medicine Team Team phone # [phone]  Thank you for allowing the Palliative Medicine Team to assist in the care of this patient. Please utilize secure chat with additional questions, if there is no response within 30 minutes please call the above phone number.  Palliative Medicine Team providers are available by phone from 7am to 7pm daily and can be reached through the team cell phone.  Should this patient require assistance outside of these hours, please call the patient's attending physician.

## 2021-06-21 NOTE — Plan of Care (Signed)
  Problem: Nutrition: Goal: Adequate nutrition will be maintained Outcome: Progressing   Problem: Coping: Goal: Level of anxiety will decrease Outcome: Progressing   Problem: Elimination: Goal: Will not experience complications related to bowel motility Outcome: Progressing   Problem: Pain Managment: Goal: General experience of comfort will improve Outcome: Progressing   Problem: Safety: Goal: Ability to remain free from injury will improve Outcome: Progressing   

## 2021-06-21 NOTE — Progress Notes (Signed)
This chaplain responded to PMT PA-Josseline consult for creating the Pt. HCPOA.    The Pt. RN-Joe provided an update. The chaplain understands the Pt. is at [facility] for radiation. The Pt. expected return is approx. 2 hours. This chaplain will attempt a revisit this afternoon.  Chaplain Sallyanne Kuster [phone]

## 2021-06-22 DIAGNOSIS — Z66 Do not resuscitate: Secondary | ICD-10-CM

## 2021-06-22 DIAGNOSIS — R748 Abnormal levels of other serum enzymes: Secondary | ICD-10-CM | POA: Diagnosis not present

## 2021-06-22 DIAGNOSIS — Z789 Other specified health status: Secondary | ICD-10-CM

## 2021-06-22 DIAGNOSIS — C799 Secondary malignant neoplasm of unspecified site: Secondary | ICD-10-CM | POA: Diagnosis not present

## 2021-06-22 DIAGNOSIS — R5381 Other malaise: Secondary | ICD-10-CM

## 2021-06-22 DIAGNOSIS — K769 Liver disease, unspecified: Secondary | ICD-10-CM | POA: Diagnosis not present

## 2021-06-22 DIAGNOSIS — Z515 Encounter for palliative care: Secondary | ICD-10-CM

## 2021-06-22 DIAGNOSIS — K6389 Other specified diseases of intestine: Secondary | ICD-10-CM | POA: Diagnosis not present

## 2021-06-22 DIAGNOSIS — Z7189 Other specified counseling: Secondary | ICD-10-CM | POA: Diagnosis not present

## 2021-06-22 DIAGNOSIS — R52 Pain, unspecified: Secondary | ICD-10-CM

## 2021-06-22 DIAGNOSIS — R638 Other symptoms and signs concerning food and fluid intake: Secondary | ICD-10-CM

## 2021-06-22 DIAGNOSIS — M8448XA Pathological fracture, other site, initial encounter for fracture: Secondary | ICD-10-CM | POA: Diagnosis not present

## 2021-06-22 DIAGNOSIS — C778 Secondary and unspecified malignant neoplasm of lymph nodes of multiple regions: Secondary | ICD-10-CM | POA: Diagnosis not present

## 2021-06-22 LAB — SURGICAL PATHOLOGY

## 2021-06-22 MED ORDER — CARVEDILOL 6.25 MG PO TABS
6.2500 mg | ORAL_TABLET | Freq: Two times a day (BID) | ORAL | Status: DC
  Administered 2021-06-22 – 2021-06-23 (×2): 6.25 mg via ORAL
  Filled 2021-06-22 (×2): qty 1

## 2021-06-22 NOTE — NC FL2 (Signed)
[place] LEVEL OF CARE SCREENING TOOL     IDENTIFICATION  Patient Name: Raymond Henderson Birthdate: [birth year] Sex: male Admission Date (Current Location): 06/17/2021  [facility] and [place] Number:  Herbalist and Address:  [facility] [place], [place] [address] [place], [place] [address]      Provider Number: [id number]  Attending Physician Name and Address:  Mercy Riding, MD  Relative Name and Phone Number:  Malka So Significant other [phone]    Current Level of Care: Hospital Recommended Level of Care: [place] Prior Approval Number:    Date Approved/Denied:   PASRR Number: [id number]  Discharge Plan: SNF    Current Diagnoses: Patient Active Problem List   Diagnosis Date Noted   Physical deconditioning 06/22/2021   Colonic mass 06/20/2021   Liver lesion 06/20/2021   Hyponatremia 06/20/2021   Elevated alkaline phosphatase level 06/20/2021   Toxic encephalopathy 06/20/2021   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 06/18/2021   Pathologic T11 and L1 fracture 06/18/2021   Pathologic fracture of thoracic vertebrae, initial encounter 06/18/2021   Hypercalcemia 06/18/2021   Normocytic anemia 06/18/2021   Goals of care, counseling/discussion 06/18/2021    Orientation RESPIRATION BLADDER Height & Weight     Self, Time, Situation, Place  Normal Continent Weight: 184 lb (83.5 kg) Height:  '5\' 9"'$  (175.3 cm)  BEHAVIORAL SYMPTOMS/MOOD NEUROLOGICAL BOWEL NUTRITION STATUS      Incontinent Diet (see discharge summary)  AMBULATORY STATUS COMMUNICATION OF NEEDS Skin   Total Care Verbally Normal                       Personal Care Assistance Level of Assistance  Bathing, Feeding, Dressing Bathing Assistance: Maximum assistance Feeding assistance: Limited assistance Dressing Assistance: Maximum assistance     Functional Limitations Info  Sight, Hearing, Speech Sight Info: Adequate Hearing Info:  Adequate Speech Info: Adequate    SPECIAL CARE FACTORS FREQUENCY  PT (By licensed PT), OT (By licensed OT)     PT Frequency: 5x week OT Frequency: 5x week            Contractures Contractures Info: Not present    Additional Factors Info  Code Status, Allergies Code Status Info: DNR Allergies Info: penicillins           Current Medications (06/22/2021):  This is the current hospital active medication list Current Facility-Administered Medications  Medication Dose Route Frequency Provider Last Rate Last Admin   acetaminophen (TYLENOL) tablet 650 mg  650 mg Oral Q6H PRN Shalhoub, Sherryll Burger, MD       Or   acetaminophen (TYLENOL) suppository 650 mg  650 mg Rectal Q6H PRN Shalhoub, Sherryll Burger, MD       bisacodyl (DULCOLAX) suppository 10 mg  10 mg Rectal BID Elgergawy, Silver Huguenin, MD   10 mg at 06/21/21 2134   carvedilol (COREG) tablet 6.25 mg  6.25 mg Oral BID WC Gonfa, Taye T, MD       feeding supplement (ENSURE ENLIVE / ENSURE PLUS) liquid 237 mL  237 mL Oral TID BM Elgergawy, Silver Huguenin, MD   237 mL at 06/22/21 1418   fentaNYL (DURAGESIC) 25 MCG/HR 1 patch  1 patch Transdermal Q72H Volanda Napoleon, MD   1 patch at 06/20/21 2059   heparin injection 5,000 Units  5,000 Units Subcutaneous Q8H Skeet Simmer, RPH   5,000 Units at 06/22/21 1417   HYDROmorphone (DILAUDID) injection 1 mg  1  mg Intravenous Q2H PRN Volanda Napoleon, MD   1 mg at 06/21/21 0426   iohexol (OMNIPAQUE) 300 MG/ML solution 100 mL  100 mL Intravenous Once PRN Shalhoub, Sherryll Burger, MD       ondansetron [facility]) tablet 4 mg  4 mg Oral Q6H PRN Shalhoub, Sherryll Burger, MD       Or   ondansetron [facility]) injection 4 mg  4 mg Intravenous Q6H PRN Shalhoub, Sherryll Burger, MD       oxyCODONE (Oxy IR/ROXICODONE) immediate release tablet 10 mg  10 mg Oral Q3H PRN Volanda Napoleon, MD   10 mg at 06/22/21 1416   polyethylene glycol (MIRALAX / GLYCOLAX) packet 17 g  17 g Oral BID PRN Wendee Beavers T, MD   17 g at 06/21/21 1312   potassium  PHOSPHATE 15 mmol in dextrose 5 % 250 mL infusion  15 mmol Intravenous Once Mercy Riding, MD   Stopped at 06/21/21 0925   senna-docusate (Senokot-S) tablet 2 tablet  2 tablet Oral BID Elgergawy, Silver Huguenin, MD   2 tablet at 06/22/21 5449     Discharge Medications: Please see discharge summary for a list of discharge medications.  Relevant Imaging Results:  Relevant Lab Results:   Additional Information SSN: [id number]  Joanne Chars, LCSW

## 2021-06-22 NOTE — Progress Notes (Incomplete)
Daily Progress Note   Patient Name: Raymond Henderson       Date: 06/22/2021 DOB: 07/03/1950  Age: 71 y.o. MRN#: [id number] Attending Physician: Mercy Riding, MD Primary Care Physician: Kathalene Frames, MD Admit Date: 06/17/2021  Reason for Consultation/Follow-up: {Reason for Consult:23484}  Subjective: ***  All questions and concerns addressed. Encouraged to call with questions and/or concerns. PMT card provided.  Length of Stay: 4  Current Medications: Scheduled Meds:   bisacodyl  10 mg Rectal BID   feeding supplement  237 mL Oral TID BM   fentaNYL  1 patch Transdermal Q72H   heparin injection (subcutaneous)  5,000 Units Subcutaneous Q8H   senna-docusate  2 tablet Oral BID    Continuous Infusions:  potassium PHOSPHATE IVPB (in mmol) Stopped (06/21/21 0925)    PRN Meds: acetaminophen **OR** acetaminophen, HYDROmorphone (DILAUDID) injection, iohexol, ondansetron **OR** ondansetron (ZOFRAN) IV, oxyCODONE, polyethylene glycol  Physical Exam          Vital Signs: BP (!) 157/90 (BP Location: Right Arm)   Pulse 95   Temp 98 F (36.7 C) (Oral)   Resp 17   Ht '5\' 9"'$  (1.753 m)   Wt 83.5 kg   SpO2 96%   BMI 27.17 kg/m  SpO2: SpO2: 96 % O2 Device: O2 Device: Room Air O2 Flow Rate: O2 Flow Rate (L/min): 2 L/min  Intake/output summary:  Intake/Output Summary (Last 24 hours) at 06/22/2021 1258 Last data filed at 06/22/2021 1100 Gross per 24 hour  Intake 136.61 ml  Output 1452 ml  Net -1315.39 ml   LBM: Last BM Date : 06/21/21 Baseline Weight: Weight: 83.5 kg Most recent weight: Weight: 83.5 kg       Palliative Assessment/Data:      Patient Active Problem List   Diagnosis Date Noted   Colonic mass 06/20/2021   Liver lesion 06/20/2021   Hyponatremia 06/20/2021    Elevated alkaline phosphatase level 06/20/2021   Toxic encephalopathy 06/20/2021   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 06/18/2021   Pathologic T11 and L1 fracture 06/18/2021   Pathologic fracture of thoracic vertebrae, initial encounter 06/18/2021   Hypercalcemia 06/18/2021   Normocytic anemia 06/18/2021   Goals of care, counseling/discussion 06/18/2021    Palliative Care Assessment & Plan   Patient Profile: ***  Assessment: Principal Problem:   Metastatic cancer  with liver and osseous mets Active Problems:   Pathologic T11 and L1 fracture   Pathologic fracture of thoracic vertebrae, initial encounter   Hypercalcemia   Normocytic anemia   Goals of care, counseling/discussion   Colonic mass   Liver lesion   Hyponatremia   Elevated alkaline phosphatase level   Toxic encephalopathy   Recommendations/Plan: Patient has decided to continue palliative radiation for pain management. He has opted not to pursue systemic therapies  Continue DNR/DNI as previously documented - durable DNR form completed and placed in shadow chart. Copy was made and will be scanned into Vynca/ACP tab Goal is for discharge to rehab vs AL vs LTC with outpatient Palliative Care to follow while undergoing palliative radiation - patient and SO discussing options. Patient understands rehab will likely not be beneficial at this time [facility] notified and consulted for: outpatient Palliative Care referral - requesting [facility] Discussed case with [facility] liaison - after review by their MD, patient would qualify for hospice services while undergoing palliative radiation. Will discuss this option with patient and his SO tomorrow 6/10 Patient's pain is well managed today - continue current medication regimen  PMT will continue to follow and support holistically  Goals of Care and Additional Recommendations: Limitations on Scope of Treatment: {Recommended Scope and Preferences:21019}  Code Status:     Code Status Orders  (From admission, onward)           Start     Ordered   06/18/21 0749  Do not attempt resuscitation (DNR)  Continuous       Question Answer Comment  In the event of cardiac or respiratory ARREST Do not call a "code blue"   In the event of cardiac or respiratory ARREST Do not perform Intubation, CPR, defibrillation or ACLS   In the event of cardiac or respiratory ARREST Use medication by any route, position, wound care, and other measures to relive pain and suffering. May use oxygen, suction and manual treatment of airway obstruction as needed for comfort.      06/18/21 0749           Code Status History     This patient has a current code status but no historical code status.       Prognosis:  {Palliative Care Prognosis:23504}  Discharge Planning: {Palliative dispostion:23505}  Care plan was discussed with ***  Thank you for allowing the Palliative Medicine Team to assist in the care of this patient.   Time In: *** Time Out: *** Total Time *** Prolonged Time Billed  {YES NO:22349}       Greater than 50%  of this time was spent counseling and coordinating care related to the above assessment and plan.  Lin Landsman, NP  Please contact Palliative Medicine Team phone at [phone] for questions and concerns.    *Portions of this note are a verbal dictation therefore any spelling and/or grammatical errors are due to the "[employer] One" system interpretation.

## 2021-06-22 NOTE — Progress Notes (Addendum)
The chaplain is present with the Pt. for F/U on the Pt. Advance Directive.  The Pt. is awake and spends a few minutes telling the chaplain about his dog "Wynelle Bourgeois."   The Pt. is not aware of a time Horris Latino will be visiting today and gives the chaplain permission to phone [place]. The chaplain left a voice mail for Horris Latino inquiring about a time to finish yesterday's AD conversation.  The Pt. shares his pain is manageable today. The chaplain understands the Pt. is feeling the effects of the pain medicine as he attempts to have a conversation with the chaplain. The chaplain agrees to F/U later in the day.  **1141 The Pt. significant other-Bonnie returned the chaplain's phone call. The chaplain understands Horris Latino and the Pt. will meet with the chaplain on Monday.  The chaplain offered a listening presence as Horris Latino reflected on the meeting with Dr. Marin Olp this morning. The chaplain understands Horris Latino will continue to be the Pt. companion in discerning the next steps, however the Pt. will make his own decisions. Horris Latino shares she was surprised by her own emotional response and will look toward the medical team for continued education. The chaplain introduced the supportive role of Palliative Care and updated PMT NP-Amber on today's conversation.  Chaplain Sallyanne Kuster [phone]

## 2021-06-22 NOTE — Progress Notes (Signed)
PROGRESS NOTE  Raymond Henderson YDX:[id number] DOB: July 17, 1950   PCP: Kathalene Frames, MD  Patient is from: Home.  Lives alone.  DOA: 06/17/2021 LOS: 4  Chief complaints Chief Complaint  Patient presents with   Fall   Back Pain     Brief Narrative / Interim history: 71 year old M with no known PMH presented to ED with back pain for about 5 weeks and abdominal pain and constipation for about 10 days and found to have ascending colon mass with liver, vertebral and pelvic lesions as well as T11 and L1 pathologic fracture without significant canal stenosis.  Underwent US-guided liver biopsy on 6/5.  Pathology with poorly differentiated adenocarcinoma.  He had T11 and L1 vertebral biopsy and kyphoplasty on 6/7. Path with metastatic high-grade neuroendocrine carcinoma, favor small cell  type.   Radiation oncology consulted and started palliative radiation on 6/8.  Oncology, radiation oncology and palliative medicine following.  Transferring to [facility] for radiation treatment.  Therapy recommended SNF.   Subjective: Seen and examined earlier this morning.  No major events overnight of this morning.  He has some back pain.  He rates pain 5/10.  No other complaints.  Objective: Vitals:   06/21/21 1329 06/21/21 1937 06/22/21 0446 06/22/21 0811  BP: (!) 143/79 (!) 148/82 (!) 147/93 (!) 157/90  Pulse: 93 99 (!) 103 95  Resp: '17 16 16 17  '$ Temp: 98 F (36.7 C) 98.2 F (36.8 C)  98 F (36.7 C)  TempSrc: Oral   Oral  SpO2: 97% 97% 98% 96%  Weight:      Height:        Examination: GENERAL: No apparent distress.  Nontoxic. HEENT: MMM.  Vision and hearing grossly intact.  NECK: Supple.  No apparent JVD.  RESP:  No IWOB.  Fair aeration bilaterally. CVS:  RRR. Heart sounds normal.  ABD/GI/GU: BS+. Abd soft, NTND.  MSK/EXT:  Moves extremities. No apparent deformity. No edema.  SKIN: no apparent skin lesion or wound NEURO: Awake and alert. Oriented appropriately.  No apparent focal  neuro deficit. PSYCH: Calm. Normal affect.   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  Imaging revealed ascending colon mass with liver and vertebral lesions as well as T11 and L1 pathologic fractures. -S/p US guided liver biopsy on 6/5.  Pathology with poorly differentiated adenocarcinoma -S/p T11 and L1 biopsy as well as kyphoplasty on 6/7. Path with metastatic high-grade neuroendocrine carcinoma, favor small cell  type -Simulation and palliative radiation 6/8>> -Pain control with fentanyl patch, oxycodone and IV Dilaudid -Bowel regimen -Transfer to [facility].  Pathologic T11 and L1 fracture S/p image guided biopsy and kyphoplasty on 6/7.  Pathology as above. Simulation and palliative radiation 6/8>> Pain control as above  Hypercalcemia Likely due to malignancy.  Seems to have improved today. -Resolved with IV fluid.  Normocytic anemia Recent Labs    06/17/21 2138 06/18/21 0810 06/19/21 0232 06/20/21 0359 06/21/21 0248  HGB 11.4* 11.4* 12.4* 11.9* 10.4*  Likely anemia of chronic disease.  Slight drop for likely from IV fluid. -Monitor   Goals of care, counseling/discussion See discussion by previous attending.  Goal status changed to DNR/DNI.   -Palliative medicine following.  Physical deconditioning PT/OT recommended SNF.  Patient in agreement -TOC on board  Toxic encephalopathy Likely from pain medications.  However, pain control is  very essential -Reorientation and delirium precautions -Fall precautions  Elevated alkaline phosphatase level In the setting of osseous metastasis. -Continue monitoring  Hyponatremia Stable.  Continue monitoring  Colonic mass Concerning for malignancy.   DVT prophylaxis:  heparin injection 5,000 Units Start: 06/20/21  2200 SCDs Start: 06/18/21 0749  Code Status: DNR/DNI Family Communication: Updated patient's significant other at bedside. Level of care: Med-Surg.  We will transfer to [facility] for daily radiation treatment. Status is: Inpatient Remains inpatient appropriate because:  metastatic cancer with pathologic fracture requiring pain control   Final disposition: SNF Consultants:  Oncology Radiation oncology Palliative medicine  Sch Meds:  Scheduled Meds:  bisacodyl  10 mg Rectal BID   feeding supplement  237 mL Oral TID BM   fentaNYL  1 patch Transdermal Q72H   heparin injection (subcutaneous)  5,000 Units Subcutaneous Q8H   senna-docusate  2 tablet Oral BID   Continuous Infusions:  potassium PHOSPHATE IVPB (in mmol) Stopped (06/21/21 0925)   PRN Meds:.acetaminophen **OR** acetaminophen, HYDROmorphone (DILAUDID) injection, iohexol, ondansetron **OR** ondansetron (ZOFRAN) IV, oxyCODONE, polyethylene glycol  Antimicrobials: Anti-infectives (From admission, onward)    Start     Dose/Rate Route Frequency Ordered Stop   06/20/21 1045  vancomycin (VANCOCIN) IVPB 1000 mg/200 mL premix        1,000 mg 200 mL/hr over 60 Minutes Intravenous To Radiology 06/20/21 0950 06/20/21 1316        I have personally reviewed the following labs and images: CBC: Recent Labs  Lab 06/17/21 2138 06/18/21 0810 06/19/21 0232 06/20/21 0359 06/21/21 0248  WBC 17.0* 14.0* 15.0* 16.3* 15.2*  NEUTROABS  --  10.8* 12.1*  --   --   HGB 11.4* 11.4* 12.4* 11.9* 10.4*  HCT 36.4* 36.1* 37.7* 36.8* 32.4*  MCV 83.3 83.6 81.6 82.1 82.7  PLT 346 326 239 332 283   BMP &GFR Recent Labs  Lab 06/17/21 2138 06/18/21 0810 06/19/21 0443 06/20/21 0542 06/21/21 0248  NA 132* 135 134* 133* 133*  K 4.0 4.1 4.5 3.9 3.6  CL 96* 98 99 96* 99  CO2 '25 27 27 25 24  '$ GLUCOSE 110* 106* 123* 137* 102*  BUN 24* '19 20 17 '$ 25*  CREATININE 1.01 0.87 0.95 0.86 0.85  CALCIUM 10.5* 10.8* 11.7* 10.7* 9.3  MG  --   --   2.3  --  2.0  PHOS  --   --   --   --  2.1*   Estimated Creatinine Clearance: 78.6 mL/min (by C-G formula based on SCr of 0.85 mg/dL). Liver & Pancreas: Recent Labs  Lab 06/18/21 0509 06/18/21 0810 06/19/21 0443 06/20/21 0542 06/21/21 0248  AST 73* 68* 63* 59*  --   ALT 46* 46* 40 38  --   ALKPHOS 725* 759* 792* 797*  --   BILITOT 2.0* 1.8* 2.1* 1.7*  --   PROT 6.7 6.5 6.6 6.6  --   ALBUMIN 2.5* 2.4* 2.5* 2.5* 2.3*   No results for input(s): "LIPASE", "AMYLASE" in the last 168 hours. No results for input(s): "AMMONIA" in the last 168 hours. Diabetic: No results for input(s): "HGBA1C" in the last 72 hours. No results for input(s): "GLUCAP" in the last 168 hours. Cardiac Enzymes: No results for input(s): "CKTOTAL", "CKMB", "CKMBINDEX", "TROPONINI" in the last 168 hours. No results for input(s): "PROBNP" in the last 8760 hours. Coagulation Profile: Recent Labs  Lab 06/18/21 0509  INR 1.1   Thyroid Function Tests: No results for input(s): "TSH", "T4TOTAL", "FREET4", "T3FREE", "THYROIDAB" in the  last 72 hours. Lipid Profile: No results for input(s): "CHOL", "HDL", "LDLCALC", "TRIG", "CHOLHDL", "LDLDIRECT" in the last 72 hours. Anemia Panel: No results for input(s): "VITAMINB12", "FOLATE", "FERRITIN", "TIBC", "IRON", "RETICCTPCT" in the last 72 hours.  Urine analysis:    Component Value Date/Time   COLORURINE YELLOW 06/18/2021 0436   APPEARANCEUR CLEAR 06/18/2021 0436   LABSPEC 1.012 06/18/2021 0436   PHURINE 7.0 06/18/2021 0436   GLUCOSEU NEGATIVE 06/18/2021 0436   HGBUR NEGATIVE 06/18/2021 0436   BILIRUBINUR NEGATIVE 06/18/2021 0436   KETONESUR NEGATIVE 06/18/2021 0436   PROTEINUR NEGATIVE 06/18/2021 0436   NITRITE NEGATIVE 06/18/2021 0436   LEUKOCYTESUR NEGATIVE 06/18/2021 0436   Sepsis Labs: Invalid input(s): "PROCALCITONIN", "LACTICIDVEN"  Microbiology: Recent Results (from the past 240 hour(s))  Culture, blood (Routine X 2) w Reflex to ID Panel     Status:  None (Preliminary result)   Collection Time: 06/18/21  5:09 AM   Specimen: BLOOD LEFT HAND  Result Value Ref Range Status   Specimen Description BLOOD LEFT HAND  Final   Special Requests   Final    AEROBIC BOTTLE ONLY Blood Culture results may not be optimal due to an inadequate volume of blood received in culture bottles   Culture   Final    NO GROWTH 4 DAYS Performed at [facility], [place] [address], [place], [place] [address]    Report Status PENDING  Incomplete  Culture, blood (Routine X 2) w Reflex to ID Panel     Status: None (Preliminary result)   Collection Time: 06/18/21  5:18 AM   Specimen: BLOOD  Result Value Ref Range Status   Specimen Description BLOOD LEFT ANTECUBITAL  Final   Special Requests AEROBIC BOTTLE ONLY Blood Culture adequate volume  Final   Culture   Final    NO GROWTH 4 DAYS Performed at [facility], [place] [address] [place], [place] [address]    Report Status PENDING  Incomplete    Radiology Studies: No results found.    Skylier Kretschmer T. Mitchell  If 7PM-7AM, please contact night-coverage [email or web address] 06/22/2021, 2:11 PM

## 2021-06-22 NOTE — Progress Notes (Signed)
Inpatient Rehabilitation Admissions Coordinator   Patient not a CIR candidate. Feel other rehab venues need to be pursued.  Danne Baxter, RN, MSN Rehab Admissions Coordinator [phone] 06/22/2021 1:11 PM

## 2021-06-22 NOTE — Progress Notes (Addendum)
Brief Palliative Medicine Progress Note:  Chart reviewed. [place] discussion completed today with patient and his SO/Bonnie - full note to follow:  Recommendations/Plan: Patient has decided to continue palliative radiation for pain management. He has opted not to pursue systemic therapy Continue DNR/DNI as previously documented - durable DNR form completed and placed in shadow chart. Copy was made and will be scanned into Vynca/ACP tab If can't complete palliative radiation inpatient, goal is for discharge to rehab vs AL vs LTC with outpatient Palliative Care to follow - patient and SO discussing options. Patient understands rehab will likely not be beneficial at this time [facility] notified and consulted for: outpatient Palliative Care referral - requesting [facility] Discussed case with [facility] liaison - after review by their MD, patient would qualify for hospice services while undergoing palliative radiation. Will discuss this option with patient and his SO tomorrow 6/10 Patient's pain is well managed today - continue current medication regimen  PMT will continue to follow and support holistically  Thank you for allowing PMT to assist in the care of this patient.  Shawana Knoch M. Tamala [facility] Palliative Medicine Team Team Phone: [phone] NO CHARGE

## 2021-06-22 NOTE — TOC Initial Note (Addendum)
Transition of Care [facility]) - Initial/Assessment Note    Patient Details  Name: Raymond Henderson MRN: [id number] Date of Birth: 08-10-1950  [facility] CM/SW Contact:    Joanne Chars, LCSW Phone Number: 06/22/2021, 3:00 PM  Clinical Narrative:   CSW met with pt regarding recommendation for SNF.  Pt tearful, states he has been unable to reach his wife/Sig other Horris Latino and is concerned.  Pt appears a little confused, states he is worried Horris Latino has abandoned him, then states she was here this AM and said she would be back shortly when she left.    CSW talked about decisions, pt says he has decided he wants to pursue radiation treatment and he also would like to pursue SNF as well.  Permission given to speak with Horris Latino, permission given to send out referral in hub.  CSW returned to the room 15 minutes later with choice document and Horris Latino in room, she also confirms desire for SNF.  Referral sent out in hub.      Pt also being considered for residential hospice/[facility].  CSW also provided list of ALF.           Expected Discharge Plan: Skilled Nursing Facility Barriers to Discharge: Continued Medical Work up, SNF Pending bed offer   Patient Goals and CMS Choice Patient states their goals for this hospitalization and ongoing recovery are:: pt does want to pursue radiation treatment CMS Medicare.gov Compare Post Acute Care list provided to:: Patient Represenative (must comment) Choice offered to / list presented to : Spouse Horris Latino)  Expected Discharge Plan and Services Expected Discharge Plan: [place] In-house Referral: Clinical Social Work   Post Acute Care Choice: [place] Living arrangements for the past 2 months: Apartment                                      Prior Living Arrangements/Services Living arrangements for the past 2 months: Apartment Lives with:: Spouse Horris Latino) Patient language and need for interpreter  reviewed:: Yes Do you feel safe going back to the place where you live?: Yes      Need for Family Participation in Patient Care: Yes (Comment) Care giver support system in place?: Yes (comment) Current home services: Other (comment) (none) Criminal Activity/Legal Involvement Pertinent to Current Situation/Hospitalization: No - Comment as needed  Activities of Daily Living   ADL Screening (condition at time of admission) Patient's cognitive ability adequate to safely complete daily activities?: Yes Is the patient deaf or have difficulty hearing?: No Does the patient have difficulty seeing, even when wearing glasses/contacts?: No Does the patient have difficulty concentrating, remembering, or making decisions?: No Patient able to express need for assistance with ADLs?: Yes Does the patient have difficulty dressing or bathing?: Yes Independently performs ADLs?: No Communication: Independent Dressing (OT): Needs assistance Is this a change from baseline?: Change from baseline, expected to last <3days Grooming: Needs assistance Is this a change from baseline?: Change from baseline, expected to last <3 days Feeding: Needs assistance Is this a change from baseline?: Change from baseline, expected to last <3 days Bathing: Needs assistance Is this a change from baseline?: Change from baseline, expected to last <3 days Toileting: Needs assistance Is this a change from baseline?: Change from baseline, expected to last <3 days In/Out Bed: Needs assistance Is this a change from baseline?: Change from baseline, expected to last <3 days Walks in  Home: Needs assistance Is this a change from baseline?: Change from baseline, expected to last <3 days Does the patient have difficulty walking or climbing stairs?: Yes Weakness of Legs: None Weakness of Arms/Hands: None  Permission Sought/Granted Permission sought to share information with : Family Supports Permission granted to share information with  : Yes, Verbal Permission Granted  Share Information with NAME: wife Horris Latino  Permission granted to share info w AGENCY: SNF        Emotional Assessment Appearance:: Appears stated age Attitude/Demeanor/Rapport: Crying Affect (typically observed): Tearful/Crying Orientation: : Oriented to Self, Oriented to Place, Oriented to  Time, Oriented to Situation Alcohol / Substance Use: Not Applicable Psych Involvement: No (comment)  Admission diagnosis:  Metastatic cancer ([place]) [C79.9] 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] Generalized weakness [R53.1] Pathologic fracture of thoracic vertebrae, initial encounter [G98.42JI] Severe pain [R52] Pathological fracture due to neoplastic disease, unspecified fracture site, initial encounter [M84.50XA] Patient Active Problem List   Diagnosis Date Noted   Physical deconditioning 06/22/2021   Colonic mass 06/20/2021   Liver lesion 06/20/2021   Hyponatremia 06/20/2021   Elevated alkaline phosphatase level 06/20/2021   Toxic encephalopathy 06/20/2021   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 06/18/2021   Pathologic T11 and L1 fracture 06/18/2021   Pathologic fracture of thoracic vertebrae, initial encounter 06/18/2021   Hypercalcemia 06/18/2021   Normocytic anemia 06/18/2021   Goals of care, counseling/discussion 06/18/2021   PCP:  Kathalene Frames, MD Pharmacy:   [facility] [phone] [address], [address] AT [facility] [place] [address] Phone: [phone] Fax: [phone]     Social Determinants of Health (SDOH) Interventions    Readmission Risk Interventions     No data to display

## 2021-06-22 NOTE — Progress Notes (Signed)
I had a long talk with Mr. Mortimer and his friend this morning.  I explained to him the diagnosis.  This is a poorly differentiated colonic adenocarcinoma.  Again, I am surprised that the CEA really has not been all that high.  He says that his pain is doing a little bit better.  He had the vertebroplasty a couple days ago.  Hopefully this is starting to help him a little bit.  I think he went over for radiation simulation.  I told him that if he does only do 1 thing, that radiation would be what I would recommend just to help with his pain to help heal the cancer in his back.  I told him that if he did not wish to have any systemic therapy, I would not think that he would make it more than 2 months or so.  His prealbumin is only 5.6.  This is a truly ominous finding.  I would told him that his nutritional state really needs to improve if we are going to have her think about treating him.  It be nice to get his prealbumin above 10.  Again, this is a tough situation.  I told him that if he did decide to take systemic therapy and that it worked, that he could certainly live for more than a year.  I think the chance of systemic chemotherapy working probably is going to be about 25-30% at best.  He just does not have a great performance status right now.  I am not sure he is going be able to go home since he lives by himself.  He really needs to have radiation therapy.  I do not know if this can be done as an inpatient.  It certainly would be difficult getting him back and forth to radiation.  I told he and his friend that he may have to go to assisted living.  He may have to go to some rehab facility to try to get a little more functional.  I did send off molecular analysis on his cancer.  This probable come back for another couple weeks.  This could certainly give [place] a bit more information as to the prognosis for him.  I know that he is trying.  I know that he is quite emotional.  That is friend  was with him.  She seems very nice.  I know that she obviously cares about him.  I told him just to think about things.  I told him that he really needs to try to improve his nutritional intake.  I think this will help with quite a bit.  Again, I think if he took radiation, this would certainly help him out.  I would think he would probably need at least 10 treatments of radiation.  Again, how much can be done as an inpatient versus outpatient is difficult to say.  I do appreciate all the great care that he is getting from the staff up on [facility].   Lattie Haw, MD  Proverbs 12:25

## 2021-06-22 NOTE — Progress Notes (Signed)
Called and tried to give report to [facility]. Was unsuccessful in the attempt. They will call after shift change.

## 2021-06-22 NOTE — Assessment & Plan Note (Signed)
PT/OT recommended SNF.  Patient in agreement -TOC on board

## 2021-06-23 DIAGNOSIS — C799 Secondary malignant neoplasm of unspecified site: Secondary | ICD-10-CM | POA: Diagnosis not present

## 2021-06-23 DIAGNOSIS — C7951 Secondary malignant neoplasm of bone: Secondary | ICD-10-CM | POA: Diagnosis not present

## 2021-06-23 DIAGNOSIS — R52 Pain, unspecified: Secondary | ICD-10-CM | POA: Diagnosis not present

## 2021-06-23 DIAGNOSIS — Z7189 Other specified counseling: Secondary | ICD-10-CM | POA: Diagnosis not present

## 2021-06-23 DIAGNOSIS — K6389 Other specified diseases of intestine: Secondary | ICD-10-CM | POA: Diagnosis not present

## 2021-06-23 DIAGNOSIS — Z515 Encounter for palliative care: Secondary | ICD-10-CM | POA: Diagnosis not present

## 2021-06-23 LAB — CULTURE, BLOOD (ROUTINE X 2)
Culture: NO GROWTH
Culture: NO GROWTH
Special Requests: ADEQUATE

## 2021-06-23 MED ORDER — SODIUM CHLORIDE 0.9 % IV BOLUS
500.0000 mL | Freq: Once | INTRAVENOUS | Status: AC
  Administered 2021-06-23: 500 mL via INTRAVENOUS

## 2021-06-23 NOTE — TOC Progression Note (Signed)
Transition of Care [facility]) - Progression Note    Patient Details  Name: Raymond Henderson MRN: [id number] Date of Birth: 1950/12/25  Transition of [facility] CM/SW Contact  Leeroy Cha, RN Phone Number: 06/23/2021, 2:43 PM  Clinical Narrative:    [facility] to look at for residential hospice.   Expected Discharge Plan: [place] Barriers to Discharge: Continued Medical Work up, SNF Pending bed offer  Expected Discharge Plan and Services Expected Discharge Plan: [place] In-house Referral: Clinical Social Work   Post Acute Care Choice: [place] Living arrangements for the past 2 months: Apartment                                       Social Determinants of Health (SDOH) Interventions    Readmission Risk Interventions     No data to display

## 2021-06-23 NOTE — Progress Notes (Addendum)
[facility] Progress Note  Patient: Raymond Henderson     FUX:[id number]  DOA: 06/17/2021   PCP: Kathalene Frames, MD       Brief hospital course: This is a 71 year old male who was admitted to the hospital on 06/17/2021 for back pain, abdominal pain and constipation that has been going on for about 5 weeks.  He has not seen a PCP in many years. In the ED: Found to have ascending colon mass with liver vertebral and pelvic metastasis, T11 L1 pathological fractures. 6/5 ultrasound-guided liver biopsy> poorly differentiated adenocarcinoma 6/7-T11 L1 vertebral biopsy> high-grade neuroendocrine carcinoma favor small cell - Kyphoplasty performed on the same day Radiation oncology plans to start palliative radiation for his pain Palliative care following  Subjective:  Patient evaluated this morning.  He had no complaints and was sitting up in the chair.  RN noted that his blood pressure was in the 80s this morning.  Patient feels that he has been eating and drinking sufficiently.  Pain is controlled on current medications.  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 Pathologic T11 and L1 fracture Elevated LFTs -Back pain and abdominal pain -Plan for palliative care with palliative radiation to his spine to control pain -Status post vertebral kyphoplasty to help control pain -Currently on fentanyl patch at 25 mcg with as needed IV Dilaudid and oxycodone- - The patient has not yet a candidate for hospice home - Continue to follow for pain control -According to the physical therapy note from 6/8, he has been unable to ambulate  Active Problems: Hypotension, hyponatremia - Systolic BP in the 02R this morning - Coreg was started yesterday as he appeared to be hypertensive - He has a condom catheter and a Foley bag attached with very little urine output and dark concentrated urine-I suspect he may  be dehydrated as well - Have ordered a 500 cc normal saline bolus and will hold Coreg for now    Hypercalcemia -Related to malignancy - Resolved after IV fluids    Toxic encephalopathy -Related to pain medications-    DVT prophylaxis:  heparin injection 5,000 Units Start: 06/20/21 2200 SCDs Start: 06/18/21 0749     Code Status: DNR  Consultants: Medical oncology, radiation oncology, palliative care Level of Care: Level of care: Med-Surg Disposition Plan:  Status is: Inpatient Remains inpatient appropriate because:Needs a skilled nursing facility  Objective:   Vitals:   06/22/21 2117 06/23/21 0125 06/23/21 0639 06/23/21 1000  BP: 114/71 117/66 123/76 (!) 89/64  Pulse: 89 88 94 88  Resp: '18 18 20 15  '$ Temp: 98.5 F (36.9 C) 99.3 F (37.4 C) 97.6 F (36.4 C) 97.6 F (36.4 C)  TempSrc: Oral Oral Oral Oral  SpO2: 95% 94% 97% 94%  Weight:      Height:       Filed Weights   06/18/21 0835  Weight: 83.5 kg   Exam: General exam: Appears comfortable  HEENT: PERRLA, oral mucosa moist, no sclera icterus or thrush Respiratory system: Clear to auscultation. Respiratory effort normal. Cardiovascular system: S1 & S2 heard, regular rate and rhythm Gastrointestinal system: Abdomen soft, non-tender, nondistended. Normal bowel sounds   Central nervous system: Alert and oriented. No focal neurological deficits. Extremities: No cyanosis, clubbing or edema Skin: No rashes or ulcers Psychiatry:  Mood & affect appropriate.    Imaging and lab data was personally reviewed    CBC: Recent Labs  Lab 06/17/21 2138 06/18/21  4650 06/19/21 0232 06/20/21 0359 06/21/21 0248  WBC 17.0* 14.0* 15.0* 16.3* 15.2*  NEUTROABS  --  10.8* 12.1*  --   --   HGB 11.4* 11.4* 12.4* 11.9* 10.4*  HCT 36.4* 36.1* 37.7* 36.8* 32.4*  MCV 83.3 83.6 81.6 82.1 82.7  PLT 346 326 239 332 354   Basic Metabolic Panel: Recent Labs  Lab 06/17/21 2138 06/18/21 0810 06/19/21 0443 06/20/21 0542  06/21/21 0248  NA 132* 135 134* 133* 133*  K 4.0 4.1 4.5 3.9 3.6  CL 96* 98 99 96* 99  CO2 '25 27 27 25 24  '$ GLUCOSE 110* 106* 123* 137* 102*  BUN 24* '19 20 17 '$ 25*  CREATININE 1.01 0.87 0.95 0.86 0.85  CALCIUM 10.5* 10.8* 11.7* 10.7* 9.3  MG  --   --  2.3  --  2.0  PHOS  --   --   --   --  2.1*   GFR: Estimated Creatinine Clearance: 78.6 mL/min (by C-G formula based on SCr of 0.85 mg/dL).  Scheduled Meds:  bisacodyl  10 mg Rectal BID   feeding supplement  237 mL Oral TID BM   fentaNYL  1 patch Transdermal Q72H   heparin injection (subcutaneous)  5,000 Units Subcutaneous Q8H   senna-docusate  2 tablet Oral BID   Continuous Infusions:  potassium PHOSPHATE IVPB (in mmol) Stopped (06/21/21 0925)     LOS: 5 days   Author: Debbe Odea  06/23/2021 1:47 PM

## 2021-06-23 NOTE — Progress Notes (Signed)
Daily Progress Note   Patient Name: Raymond Henderson       Date: 06/23/2021 DOB: 08-30-1950  Age: 71 y.o. MRN#: [id number] Attending Physician: Debbe Odea, MD Primary Care Physician: Kathalene Frames, MD Admit Date: 06/17/2021 Length of Stay: 5 days  Reason for Consultation/Follow-up: Establishing goals of care and Hospice Evaluation  HPI/Patient Profile:  71 y.o. male  with no significant past medical history admitted on 06/17/2021 with severe midthoracic back pain for several weeks.    Patient reports that he last had a colonoscopy approximately 15 years ago in [place] and has not seen a PCP in approximately that many years. Upon arrival to ED, initial work-up identified widespread osseous metastatic disease seen in the lumbar spine sacrum and pelvis, an associated pathologic L1 fracture, liver metastases, and a mass in the ascending colon, likely the primary malignancy.  He was given options for chemo and associated prognosis: If no chemo estimated life expectancy of 2 months; with chemo (and IF it worked, 25-30% chance) then life expectancy of 1 year. He decided to not undergo chemo and opted for palliative radiation for symptom management.   PMT has been consulted to assist with goals of care conversation.  Subjective:   Subjective: Chart Reviewed. Updates received. Patient Assessed. Created space and opportunity for patient  and family to explore thoughts and feelings regarding current medical situation.  Today's Discussion: Today I met with the patient's bedside with the patient and his significant other Raymond Henderson.  We discussed options including skilled nursing facility versus more long-term care (assisted living versus LTC/nursing home).  I shared that I did not feel rehab would be beneficial to him at this point.  They seemed to agree with this.  We discussed assisted living versus long-term care and the patient's significant other Raymond Henderson has identified to facilities close to her  home: [facility] and rehab, [place] [facility] and rehab.  She is planning to potentially tour these facilities to see which when she feels would be best for him.  I did share that our team has consulted with [facility] and they have notified us that he could continue to get palliative radiation along with hospice care.  I shared that hospice care can be provided in the long-term care setting which would allow him to have 24-hour care with hospice involvement including palliative radiation for symptom management.  They did ask about residential hospice and I shared that I do not feel he is appropriate at this time (especially for the specific hospice agency they have selected), however he will likely become appropriate as things progress.  At that time they can transition him to residential hospice/hospice home if they decide this is what he would want.  I discussed with the patient's significant other about resources for long-term care versus assisted living.  She states that she was not aware there will be an out-of-pocket expense and he does not have resources to cover this.  I provided emotional and general support therapeutic listening, empathy, sharing of stories, and other techniques.  Answered all questions addressed all concerns to the best my ability.  Review of Systems  Constitutional:  Positive for fatigue.  Respiratory:  Negative for shortness of breath.   Cardiovascular:  Negative for chest pain.  Gastrointestinal:  Negative for abdominal pain, nausea and vomiting.    Objective:   Vital Signs:  BP (!) 89/64 (BP Location: Left Arm)   Pulse 88   Temp 97.6 F (36.4 C) (Oral)  Resp 15   Ht '5\' 9"'  (1.753 m)   Wt 83.5 kg   SpO2 94%   BMI 27.17 kg/m   Physical Exam: Physical Exam Vitals and nursing note reviewed.  Constitutional:      General: He is not in acute distress.    Appearance: He is ill-appearing.  HENT:     Head: Normocephalic and atraumatic.   Pulmonary:     Effort: Pulmonary effort is normal. No respiratory distress.  Abdominal:     General: Abdomen is flat.  Skin:    General: Skin is warm and dry.  Neurological:     General: No focal deficit present.     Mental Status: He is alert.  Psychiatric:        Mood and Affect: Mood normal.        Behavior: Behavior normal.     Palliative Assessment/Data: 50-60%   Assessment & Plan:   Impression: Present on Admission:  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  Pathologic T11 and L1 fracture  Pathologic fracture of thoracic vertebrae, initial encounter  Hypercalcemia  Normocytic anemia  71 year old male with multiple health concerns as described above.  At this point significant cancer illness where and he has declined chemotherapy and prognosis per oncology is around 2 months.  They have transferred him to [facility] for palliative radiation to help with bony mets pain.  They have elected hospice care as well.  Initially considering assisted living versus long-term care, however the patient does not have resources for private pay for this level of care.  At this point, disposition may become tricky.  Short-term rehab facilities typically will not take patients undergoing radiation.  We can certainly reach out to radiation oncology on Monday to discuss the goal of radiation being palliative and see how many treatments they could get away with to control his symptoms and not have a prolonged course.  At this point options would be evaluated for residential hospice (specifically [facility] [place] typically more liberal with timeframe requirements for residential).  Additionally, [facility] may be an option as well for similar reasons.  I last resort placement to short-term rehab to promote 24-hour care until ready for residential hospice could also be an option.  SUMMARY OF RECOMMENDATIONS   Continue DNR Plan for palliative radiation this  coming week Continue to work towards disposition Palliative medicine will follow-up on Monday Please call us for any significant changes in the meantime  Symptom Management:  Per primary team PMT is available to assist as needed  Code Status: DNR  Prognosis: < 3 months  Discharge Planning: To Be Determined  Discussed with: Medical team, nursing team, patient, patient's significant other, TOC team  Thank you for allowing [place] to participate in the care of Raymond Henderson PMT will continue to support holistically.  Time Total: 90 min  Visit consisted of counseling and education dealing with the complex and emotionally intense issues of symptom management and palliative care in the setting of serious and potentially life-threatening illness. Greater than 50%  of this time was spent counseling and coordinating care related to the above assessment and plan.  Walden Field, NP Palliative Medicine Team  Team Phone # [phone] (Nights/Weekends)  09/12/2020, 8:17 AM

## 2021-06-24 DIAGNOSIS — Z515 Encounter for palliative care: Secondary | ICD-10-CM | POA: Diagnosis not present

## 2021-06-24 DIAGNOSIS — Z7189 Other specified counseling: Secondary | ICD-10-CM | POA: Diagnosis not present

## 2021-06-24 DIAGNOSIS — C799 Secondary malignant neoplasm of unspecified site: Secondary | ICD-10-CM | POA: Diagnosis not present

## 2021-06-24 DIAGNOSIS — R52 Pain, unspecified: Secondary | ICD-10-CM | POA: Diagnosis not present

## 2021-06-24 LAB — BASIC METABOLIC PANEL
Anion gap: 10 (ref 5–15)
BUN: 22 mg/dL (ref 8–23)
CO2: 28 mmol/L (ref 22–32)
Calcium: 8.7 mg/dL — ABNORMAL LOW (ref 8.9–10.3)
Chloride: 98 mmol/L (ref 98–111)
Creatinine, Ser: 0.67 mg/dL (ref 0.61–1.24)
GFR, Estimated: >60 mL/min (ref >60)
Glucose, Bld: 156 mg/dL — ABNORMAL HIGH (ref 70–99)
Potassium: 3.9 mmol/L (ref 3.5–5.1)
Sodium: 136 mmol/L (ref 135–145)

## 2021-06-24 LAB — CBC
HCT: 35.3 % — ABNORMAL LOW (ref 39.0–52.0)
Hemoglobin: 11.1 g/dL — ABNORMAL LOW (ref 13.0–17.0)
MCH: 26.6 pg (ref 26.0–34.0)
MCHC: 31.4 g/dL (ref 30.0–36.0)
MCV: 84.4 fL (ref 80.0–100.0)
Platelets: 319 10*3/uL (ref 150–400)
RBC: 4.18 MIL/uL — ABNORMAL LOW (ref 4.22–5.81)
RDW: 20.3 % — ABNORMAL HIGH (ref 11.5–15.5)
WBC: 15.3 10*3/uL — ABNORMAL HIGH (ref 4.0–10.5)
nRBC: 0 % (ref 0.0–0.2)

## 2021-06-24 NOTE — Progress Notes (Signed)
[facility] Progress Note  Patient: Raymond Henderson     KNL:[id number]  DOA: 06/17/2021   PCP: Kathalene Frames, MD       Brief hospital course: This is a 71 year old male who was admitted to the hospital on 06/17/2021 for back pain, abdominal pain and constipation that has been going on for about 5 weeks.  He has not seen a PCP in many years. In the ED: Found to have ascending colon mass with liver vertebral and pelvic metastasis, T11 L1 pathological fractures. 6/5 ultrasound-guided liver biopsy> poorly differentiated adenocarcinoma 6/7-T11 L1 vertebral biopsy> high-grade neuroendocrine carcinoma favor small cell - Kyphoplasty performed on the same day Radiation oncology plans to start palliative radiation for his pain Palliative care following  Subjective:  Pain is around 2-4/10. No other complaints.   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 Pathologic T11 and L1 fracture Elevated LFTs -Back pain and abdominal pain -Plan for palliative care with palliative radiation to his spine to control pain -Status post vertebral kyphoplasty to help control pain -Currently on fentanyl patch at 25 mcg with as needed IV Dilaudid and oxycodone- Pain much better controlled on oral meds- if radiation can help reduce the dose of narcotics he is taking, it will be helpful - The patient has not yet a candidate for hospice home as he is eating quite well - Continue to follow for pain control -According to the physical therapy note from 6/8, he has been unable to ambulate - will be discussing radiation tomorrow once rad onc dept opens  Active Problems: Hypotension, hyponatremia - Systolic BP in the 79K this morning due to Coreg and poor oral intake on the day before - resolved with IVF and holding Coreg    Hypercalcemia -Related to malignancy - Resolved after IV fluids    Toxic  encephalopathy -Related to pain medications- resolved  Hypoalbuminemia - related to malignancy -    DVT prophylaxis:  heparin injection 5,000 Units Start: 06/20/21 2200 SCDs Start: 06/18/21 0749     Code Status: DNR  Consultants: Medical oncology, radiation oncology, palliative care Level of Care: Level of care: Med-Surg Disposition Plan:  Status is: Inpatient Remains inpatient appropriate because:Needs a skilled nursing facility  Objective:   Vitals:   06/23/21 2126 06/23/21 2126 06/24/21 0447 06/24/21 1333  BP: 124/74 124/74 135/86 124/79  Pulse: 87 88 98 87  Resp: '18 18 18 16  '$ Temp: 98.3 F (36.8 C) 98.3 F (36.8 C) (!) 97.5 F (36.4 C) 98.1 F (36.7 C)  TempSrc: Oral Oral Oral Oral  SpO2: 97% 96% 95% 96%  Weight:      Height:       Filed Weights   06/18/21 0835  Weight: 83.5 kg   Exam: General exam: Appears comfortable  HEENT: PERRLA, oral mucosa moist, no sclera icterus or thrush Respiratory system: Clear to auscultation. Respiratory effort normal. Cardiovascular system: S1 & S2 heard, regular rate and rhythm Gastrointestinal system: Abdomen soft, non-tender, nondistended. Normal bowel sounds   Central nervous system: Alert and oriented. No focal neurological deficits. Extremities: No cyanosis, clubbing or edema Skin: No rashes or ulcers Psychiatry:  Mood & affect appropriate.     Imaging and lab data was personally reviewed    CBC: Recent Labs  Lab 06/18/21 0810 06/19/21 0232 06/20/21 0359 06/21/21 0248 06/24/21 0953  WBC 14.0* 15.0* 16.3* 15.2* 15.3*  NEUTROABS 10.8* 12.1*  --   --   --  HGB 11.4* 12.4* 11.9* 10.4* 11.1*  HCT 36.1* 37.7* 36.8* 32.4* 35.3*  MCV 83.6 81.6 82.1 82.7 84.4  PLT 326 239 332 283 607    Basic Metabolic Panel: Recent Labs  Lab 06/18/21 0810 06/19/21 0443 06/20/21 0542 06/21/21 0248 06/24/21 0953  NA 135 134* 133* 133* 136  K 4.1 4.5 3.9 3.6 3.9  CL 98 99 96* 99 98  CO2 '27 27 25 24 28  '$ GLUCOSE 106* 123*  137* 102* 156*  BUN '19 20 17 '$ 25* 22  CREATININE 0.87 0.95 0.86 0.85 0.67  CALCIUM 10.8* 11.7* 10.7* 9.3 8.7*  MG  --  2.3  --  2.0  --   PHOS  --   --   --  2.1*  --     GFR: Estimated Creatinine Clearance: 83.5 mL/min (by C-G formula based on SCr of 0.67 mg/dL).  Scheduled Meds:  bisacodyl  10 mg Rectal BID   feeding supplement  237 mL Oral TID BM   fentaNYL  1 patch Transdermal Q72H   heparin injection (subcutaneous)  5,000 Units Subcutaneous Q8H   senna-docusate  2 tablet Oral BID   Continuous Infusions:  potassium PHOSPHATE IVPB (in mmol) Stopped (06/21/21 0925)     LOS: 6 days   Author: Debbe Odea  06/24/2021 3:05 PM

## 2021-06-24 NOTE — Progress Notes (Signed)
Occupational Therapy Treatment Patient Details Name: Raymond Henderson MRN: [id number] DOB: [birth year] Today's Date: 06/24/2021   History of present illness Pt is a 71 y/o male admitted secondary to worsening back pain. 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 Found to have liver mets and suspected primary lesion in colon. Workup pending. No pertinent PMH.   OT comments  Patient was noted to have increased difficulty with reaching posteriorly for hygiene tasks. Patient was educated on toileting wand use with patient reporting he would look one [place] on [place] Patient would continue to benefit from skilled OT services at this time while admitted and after d/c to address noted deficits in order to improve overall safety and independence in ADLs.     Recommendations for follow up therapy are one component of a multi-disciplinary discharge planning process, led by the attending physician.  Recommendations may be updated based on patient status, additional functional criteria and insurance authorization.    Follow Up Recommendations  Acute inpatient rehab (3hours/day)    Assistance Recommended at Discharge Frequent or constant Supervision/Assistance  Patient can return home with the following  A lot of help with walking and/or transfers;A lot of help with bathing/dressing/bathroom;Assistance with cooking/housework;Assist for transportation   Equipment Recommendations       Recommendations for Other Services      Precautions / Restrictions Precautions Precautions: Back;Fall Precaution Booklet Issued: No Precaution Comments: Provided education on log roll for comfort Restrictions Weight Bearing Restrictions: No       Mobility Bed Mobility Overal bed mobility: Needs Assistance Bed Mobility: Rolling, Sidelying to Sit, Sit to Sidelying Rolling: Min assist Sidelying to sit: Mod assist       General bed mobility comments: with cues for log rolling  after education was provided prior to movement.    Transfers                         Balance Overall balance assessment: Needs assistance Sitting-balance support: Bilateral upper extremity supported, Feet supported Sitting balance-Leahy Scale: Poor Sitting balance - Comments: reliance on UE support   Standing balance support: Bilateral upper extremity supported, During functional activity, Reliant on assistive device for balance                               ADL either performed or assessed with clinical judgement   ADL Overall ADL's : Needs assistance/impaired     Grooming: Set up;Sitting;Brushing hair Grooming Details (indicate cue type and reason): in recliner                   Toilet Transfer Details (indicate cue type and reason): transfer from edge of bed to recliner in room with min A with increased time and RW Toileting- Clothing Manipulation and Hygiene: Maximal assistance;Sit to/from stand Toileting - Clothing Manipulation Details (indicate cue type and reason): patient was unable to reach bottom without severe twisting movement. patient was stopped and educated on toileting buddy. patient reported he would look on [employer]       General ADL Comments: Limited by significant back pain with movement though agreeable to attempt all tasks with cues for back precautions to minimize pain when possible    Extremity/Trunk Assessment              Vision       Perception     Praxis  Cognition Arousal/Alertness: Awake/alert Behavior During Therapy: WFL for tasks assessed/performed Overall Cognitive Status: Within Functional Limits for tasks assessed                                 General Comments: patietn noted to have some moments of confusion during session. reported he does not have much time left        Exercises      Shoulder Instructions       General Comments      Pertinent Vitals/ Pain       Pain  Assessment Pain Assessment: Faces Faces Pain Scale: Hurts little more Pain Location: mid back Pain Descriptors / Indicators: Grimacing, Guarding, Moaning Pain Intervention(s): Limited activity within patient's tolerance, Patient requesting pain meds-RN notified, Repositioned  Home Living                                          Prior Functioning/Environment              Frequency  Min 2X/week        Progress Toward Goals  OT Goals(current goals can now be found in the care plan section)  Progress towards OT goals: Progressing toward goals     Plan Discharge plan remains appropriate    Co-evaluation                 AM-PAC OT "6 Clicks" Daily Activity     Outcome Measure   Help from another person eating meals?: A Little Help from another person taking care of personal grooming?: A Little Help from another person toileting, which includes using toliet, bedpan, or urinal?: A Lot Help from another person bathing (including washing, rinsing, drying)?: A Lot Help from another person to put on and taking off regular upper body clothing?: A Little Help from another person to put on and taking off regular lower body clothing?: A Lot 6 Click Score: 15    End of Session Equipment Utilized During Treatment: Rolling walker (2 wheels)  OT Visit Diagnosis: Other abnormalities of gait and mobility (R26.89);Pain   Activity Tolerance Patient tolerated treatment well;Patient limited by pain   Patient Left in chair;with call bell/phone within reach;with chair alarm set   Nurse Communication Patient requests pain meds;Mobility status        Time: 1452-1511 OT Time Calculation (min): 19 min  Charges: OT General Charges $OT Visit: 1 Visit OT Treatments $Self Care/Home Management : 8-22 mins  Jackelyn Poling OTR/L, MS Acute Rehabilitation Department Office# [phone] Pager# [phone]   Marcellina Millin 06/24/2021, 3:40 PM

## 2021-06-25 ENCOUNTER — Ambulatory Visit
Admit: 2021-06-25 | Discharge: 2021-06-25 | Disposition: A | Discharge status: Home / Self Care | Payer: Medicare Other | Attending: Radiation Oncology | Admitting: Radiation Oncology

## 2021-06-25 ENCOUNTER — Other Ambulatory Visit: Payer: Self-pay

## 2021-06-25 ENCOUNTER — Telehealth: Payer: Self-pay | Admitting: *Deleted

## 2021-06-25 DIAGNOSIS — K6389 Other specified diseases of intestine: Secondary | ICD-10-CM | POA: Diagnosis not present

## 2021-06-25 DIAGNOSIS — R748 Abnormal levels of other serum enzymes: Secondary | ICD-10-CM | POA: Diagnosis not present

## 2021-06-25 DIAGNOSIS — Z7189 Other specified counseling: Secondary | ICD-10-CM | POA: Diagnosis not present

## 2021-06-25 DIAGNOSIS — C778 Secondary and unspecified malignant neoplasm of lymph nodes of multiple regions: Secondary | ICD-10-CM

## 2021-06-25 DIAGNOSIS — C7951 Secondary malignant neoplasm of bone: Secondary | ICD-10-CM | POA: Diagnosis not present

## 2021-06-25 LAB — RAD ONC ARIA SESSION SUMMARY

## 2021-06-25 NOTE — Progress Notes (Signed)
[facility] Progress Note  Patient: Raymond Henderson     LTJ:[id number]  DOA: 06/17/2021   PCP: Kathalene Frames, MD       Brief hospital course: This is a 71 year old male who was admitted to the hospital on 06/17/2021 for back pain, abdominal pain and constipation that has been going on for about 5 weeks.  He has not seen a PCP in many years. In the ED: Found to have ascending colon mass with liver vertebral and pelvic metastasis, T11 L1 pathological fractures. 6/5 ultrasound-guided liver biopsy> poorly differentiated adenocarcinoma 6/7-T11 L1 vertebral biopsy> high-grade neuroendocrine carcinoma favor small cell - Kyphoplasty performed on the same day Radiation oncology plans to start palliative radiation for his pain Palliative care following  Subjective:  Pain remains controlled on pain meds.   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 Pathologic T11 and L1 fracture Elevated LFTs -Back pain and abdominal pain -Plan for palliative care with palliative radiation to his spine to control pain -Status post vertebral kyphoplasty to help control pain -Currently on fentanyl patch at 25 mcg with as needed IV Dilaudid and oxycodone- - Pain much better controlled   - if radiation can help reduce the dose of narcotics he is taking, it will be helpful - The patient has not yet a candidate for hospice home as he is eating quite well -According to the physical therapy note from 6/8, he has been unable to ambulate- looking for SNF - awaiting rac onc eval today and then SNF arrangement for tomorrow  Active Problems: Hypotension, hyponatremia - 0/76> Systolic BP in the 22Q this morning due to Coreg and poor oral intake on the day before - resolved with IVF and holding Coreg - cont to hold Coreg    Hypercalcemia -Related to malignancy - Resolved after IV fluids    Toxic  encephalopathy -Related to pain medications- resolved  Hypoalbuminemia - related to malignancy -    DVT prophylaxis:  heparin injection 5,000 Units Start: 06/20/21 2200 SCDs Start: 06/18/21 0749     Code Status: DNR  Consultants: Medical oncology, radiation oncology, palliative care Level of Care: Level of care: Med-Surg Disposition Plan:  Status is: Inpatient Remains inpatient appropriate because:Needs a skilled nursing facility  Objective:   Vitals:   06/24/21 1333 06/24/21 2109 06/25/21 0457 06/25/21 1339  BP: 124/79 (!) 151/84 125/88 134/82  Pulse: 87 (!) 103 (!) 106 100  Resp: '16 18 18   '$ Temp: 98.1 F (36.7 C) (!) 97.5 F (36.4 C) 97.8 F (36.6 C) 98.2 F (36.8 C)  TempSrc: Oral Oral Oral Oral  SpO2: 96% 96% 97% 97%  Weight:      Height:       Filed Weights   06/18/21 0835  Weight: 83.5 kg   Exam: General exam: Appears comfortable  HEENT: PERRLA, oral mucosa moist, no sclera icterus or thrush Respiratory system: Clear to auscultation. Respiratory effort normal. Cardiovascular system: S1 & S2 heard, regular rate and rhythm Gastrointestinal system: Abdomen soft, non-tender, nondistended. Normal bowel sounds   Central nervous system: Alert and oriented. No focal neurological deficits. Extremities: No cyanosis, clubbing or edema Skin: No rashes or ulcers Psychiatry:  Mood & affect appropriate.     Imaging and lab data was personally reviewed    CBC: Recent Labs  Lab 06/19/21 0232 06/20/21 0359 06/21/21 0248 06/24/21 0953  WBC 15.0* 16.3* 15.2* 15.3*  NEUTROABS 12.1*  --   --   --  HGB 12.4* 11.9* 10.4* 11.1*  HCT 37.7* 36.8* 32.4* 35.3*  MCV 81.6 82.1 82.7 84.4  PLT 239 332 283 282    Basic Metabolic Panel: Recent Labs  Lab 06/19/21 0443 06/20/21 0542 06/21/21 0248 06/24/21 0953  NA 134* 133* 133* 136  K 4.5 3.9 3.6 3.9  CL 99 96* 99 98  CO2 '27 25 24 28  '$ GLUCOSE 123* 137* 102* 156*  BUN 20 17 25* 22  CREATININE 0.95 0.86 0.85 0.67   CALCIUM 11.7* 10.7* 9.3 8.7*  MG 2.3  --  2.0  --   PHOS  --   --  2.1*  --     GFR: Estimated Creatinine Clearance: 83.5 mL/min (by C-G formula based on SCr of 0.67 mg/dL).  Scheduled Meds:  bisacodyl  10 mg Rectal BID   feeding supplement  237 mL Oral TID BM   fentaNYL  1 patch Transdermal Q72H   heparin injection (subcutaneous)  5,000 Units Subcutaneous Q8H   senna-docusate  2 tablet Oral BID   Continuous Infusions:     LOS: 7 days   Author: Debbe Odea  06/25/2021 1:54 PM

## 2021-06-25 NOTE — Progress Notes (Signed)
Overall, Raymond Henderson is about the same.  He is getting radiation treatments for his back.  He had the vertebroplasty for his back.  He is decided to forego any chemotherapy.  I understand this.  I am certainly would have no problems with this.  He is on a fentanyl patch.  He says his pain is doing better.  He is getting some physical therapy and Occupational Therapy.  I think this will certainly help.  His appetite is not all that great right now.  He has had no nausea or vomiting.  He has had no fever.  There has been no cough or shortness of breath.  There are no labs yet today.  Now, our focus is clearly quality of life.  I know that radiation therapy will help more than anything else with this.  His vital signs are pretty stable.  Temperature 97.8.  Pulse 106.  Blood pressure 125/88.  His head neck exam shows no ocular or oral lesions.  He has no adenopathy in the neck.  Lungs are clear bilaterally.  Has good air movement bilaterally.  Cardiac exam slightly tachycardic but regular.  He has no murmurs, rubs or bruits.  Abdomen is soft.  He is slightly distended.  Bowel sounds are present.  He has no guarding or rebound tenderness.  Extremity shows no clubbing, cyanosis or edema.  Neurological exam is nonfocal.  Again, Raymond Henderson has metastatic poorly differentiated adenocarcinoma of the colon.  He has liver and bony metastasis.  Our goal is quality of life.  This is to try to help improve his functionality.  He will continue radiation therapy.  At this point, I am not sure what the discharge plan for him will be or where he will go.  I know that he will get incredible care from all the staff up on 6 E.  Lattie Haw, MD  Darlyn Chamber 1:19

## 2021-06-25 NOTE — Care Management Important Message (Signed)
Important Message  Patient Details IM Letter given to the Patient. Name: Raymond Henderson MRN: [id number] Date of Birth: November 25, 1950   Medicare Important Message Given:  Yes     Kerin Salen 06/25/2021, 9:39 AM

## 2021-06-25 NOTE — Progress Notes (Signed)
Chaplain provided Ronne with a new Ecologist.  Teng and his dear friend, who is assigning as his healthcare agent, are working on completing the document today and Chaplain will come to have it notarized tomorrow.  Chaplain offered education and support.     06/25/21 1600  Clinical Encounter Type  Visited With Patient and family together  Visit Type Initial;Social support  Spiritual Encounters  Spiritual Needs Brochure;Literature

## 2021-06-25 NOTE — Progress Notes (Signed)
PT Cancellation Note  Patient Details Name: Braxley Balandran MRN: [id number] DOB: [birth year]   Cancelled Treatment:    Reason Eval/Treat Not Completed: Patient at procedure or test/unavailable (I've attempted to see pt 2x today, he was unavailable due to being at radiation, then was meeting with another provider. Will follow.)  Philomena Doheny PT 06/25/2021  Acute Rehabilitation Services Pager [phone] Office [phone]

## 2021-06-25 NOTE — Progress Notes (Addendum)
Daily Progress Note   Patient Name: Raymond Henderson       Date: 06/25/2021 DOB: 10-12-1950  Age: 71 y.o. MRN#: [id number] Attending Physician: Debbe Odea, MD Primary Care Physician: Kathalene Frames, MD Admit Date: 06/17/2021 Length of Stay: 7 days  Reason for Consultation/Follow-up: Establishing goals of care and Hospice Evaluation  HPI/Patient Profile:  71 y.o. male  with no significant past medical history admitted on 06/17/2021 with severe midthoracic back pain for several weeks.    Patient reports that he last had a colonoscopy approximately 15 years ago in [place] and has not seen a PCP in approximately that many years. Upon arrival to ED, initial work-up identified widespread osseous metastatic disease seen in the lumbar spine sacrum and pelvis, an associated pathologic L1 fracture, liver metastases, and a mass in the ascending colon, likely the primary malignancy.  He was given options for chemo and associated prognosis: If no chemo estimated life expectancy of 2 months; with chemo (and IF it worked, 25-30% chance) then life expectancy of 1 year. He decided to not undergo chemo and opted for palliative radiation for symptom management, which is started today.   PMT has been consulted to assist with goals of care conversation.  Subjective:   Subjective: Chart Reviewed. Updates received. Patient Assessed. Created space and opportunity for patient  and family to explore thoughts and feelings regarding current medical situation.  Today's Discussion: Today I met at the patient's bedside with the patient and his significant other Raymond Henderson.  We discussed his palliative radiation treatments and that I had initially/for right now that it is planned to go through next Friday (10 total treatments).  However, after discussion with radiation oncology there may be potential to shorten the course.  They stated that they met with the liaison from hospice who has sent his records to their medical  director for determination of appropriate placement (home versus residential).  We discussed need to take it a day at a time in order to see what our discharge disposition options are.  I expressed that PT is still recommending skilled nursing facility/rehab, despite 35-monthprognosis.  I spoke with the hospital liaison who feels that he is quite borderline and may not qualify for residential hospice which is generally reserved for patients with 2 weeks of life expectancy.  We discussed the difficult situation he is in where he lives alone and unable to go home alone.  His sister is apparently coming up for some time, unsure how long she is staying.  There may be a potential for the patient's sister and his significant other to take turns helping him at home.  We anticipate a rapid decline over the next few months.  Regardless of his level of care, he will likely soon be eligible for residential hospice.  I provided emotional and general support therapeutic listening, empathy, sharing of stories, and other techniques.  Answered all questions addressed all concerns to the best my ability.  Review of Systems  Constitutional:  Positive for fatigue.  Respiratory:  Negative for shortness of breath.   Cardiovascular:  Negative for chest pain.  Gastrointestinal:  Negative for abdominal pain, nausea and vomiting.    Objective:   Vital Signs:  BP 134/82 (BP Location: Left Arm)   Pulse 100   Temp 98.2 F (36.8 C) (Oral)   Resp 18   Ht _0  (1.753 m)   Wt 83.5 kg   SpO2 97%   BMI 27.17 kg/m   Physical Exam: Physical  Exam Vitals and nursing note reviewed.  Constitutional:      General: He is not in acute distress.    Appearance: He is ill-appearing.  HENT:     Head: Normocephalic and atraumatic.  Pulmonary:     Effort: Pulmonary effort is normal. No respiratory distress.  Abdominal:     General: Abdomen is flat.  Skin:    General: Skin is warm and dry.  Neurological:     General: No  focal deficit present.     Mental Status: He is alert.  Psychiatric:        Mood and Affect: Mood normal.        Behavior: Behavior normal.     Palliative Assessment/Data: 50-60%   Assessment & Plan:   Impression: Present on Admission:  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  Pathologic T11 and L1 fracture  Pathologic fracture of thoracic vertebrae, initial encounter  Hypercalcemia  Normocytic anemia  71 year old male with multiple health concerns as described above.  At this point significant cancer illness where and he has declined chemotherapy and prognosis per oncology is around 2 months.  They have transferred him to [facility] long hospital for palliative radiation to help with bony mets pain.  They have elected hospice care as well.  Initially considering assisted living versus long-term care, however the patient does not have resources for private pay for this level of care.  At this point, disposition may become tricky.  Short-term rehab facilities typically will not take patients undergoing radiation.  We can certainly reach out to radiation oncology on Monday to discuss the goal of radiation being palliative and see how many treatments they could get away with to control his symptoms and not have a prolonged course.  At this point options would be evaluated for residential hospice (specifically hospice of the [place] typically more liberal with timeframe requirements for residential).  Additionally, hospice of [facility] may be an option as well for similar reasons.  I last resort placement to short-term rehab to promote 24-hour care until ready for residential hospice could also be an option.  Follow-up with radiation oncology for anticipated duration of treatments.  SUMMARY OF RECOMMENDATIONS   Continue DNR Continue palliative radiation, extent pending Continue to work towards disposition PMT will continue to follow peripherally Please call us for any significant  changes in the meantime  Symptom Management:  Per primary team PMT is available to assist as needed  Code Status: DNR  Prognosis: < 3 months  Discharge Planning: To Be Determined  Discussed with: Medical team, nursing team, patient, patient's significant other, TOC team  Thank you for allowing [place] to participate in the care of Raymond Henderson PMT will continue to support holistically.  Billing based on MDM: High  Problems Addressed: One acute or chronic illness or injury that poses a threat to life or bodily function  Amount and/or Complexity of Data: Category 3:Discussion of management or test interpretation with external physician/other qualified health care professional/appropriate source (not separately reported)  Risks: Parenteral controlled substances and Decision not to resuscitate or to de-escalate care because of poor prognosis   Walden Field, NP Palliative Medicine Team  Team Phone # [phone] (Nights/Weekends)  09/12/2020, 8:17 AM

## 2021-06-25 NOTE — Telephone Encounter (Signed)
Received notification from [facility] that Specimen was received

## 2021-06-25 NOTE — Progress Notes (Signed)
   This pt was referred to Hospice care. I have met with both pt and friend at bedside. Discussion had about goals of care. He is definitely appropriate for hospice care. They are requesting that after he completes his radiation he transfer to the Hospice home in [facility]. WE discussed criteria for this facility. I have reviewed with my MD at Hospice home and at this time the pt doe snot meet our criteria. However we will continue to follow the pt knowing that at this fragile state anything could change. I have updated the CM Alinda Sierras.   We did discuss hospice care at home and in a SNF. He does not have 24/7 care givers at home and does not feel this is possible. He is considering SNF.   Samik Balkcom RN [phone]

## 2021-06-26 ENCOUNTER — Ambulatory Visit
Admit: 2021-06-26 | Discharge: 2021-06-26 | Disposition: A | Discharge status: Home / Self Care | Payer: Medicare Other | Attending: Radiation Oncology | Admitting: Radiation Oncology

## 2021-06-26 ENCOUNTER — Other Ambulatory Visit: Payer: Self-pay

## 2021-06-26 DIAGNOSIS — C778 Secondary and unspecified malignant neoplasm of lymph nodes of multiple regions: Secondary | ICD-10-CM | POA: Diagnosis not present

## 2021-06-26 DIAGNOSIS — E871 Hypo-osmolality and hyponatremia: Secondary | ICD-10-CM | POA: Diagnosis not present

## 2021-06-26 DIAGNOSIS — K6389 Other specified diseases of intestine: Secondary | ICD-10-CM | POA: Diagnosis not present

## 2021-06-26 DIAGNOSIS — C7951 Secondary malignant neoplasm of bone: Secondary | ICD-10-CM | POA: Diagnosis not present

## 2021-06-26 LAB — RAD ONC ARIA SESSION SUMMARY
Course Elapsed Days: 1
Plan Fractions Treated to Date: 1
Plan Fractions Treated to Date: 1
Plan Prescribed Dose Per Fraction: 4 Gy
Plan Prescribed Dose Per Fraction: 4 Gy
Plan Total Fractions Prescribed: 4
Plan Total Fractions Prescribed: 4
Plan Total Prescribed Dose: 16 Gy
Plan Total Prescribed Dose: 16 Gy
Reference Point Dosage Given to Date: 7 Gy
Reference Point Dosage Given to Date: 7 Gy
Reference Point Session Dosage Given: 4 Gy
Reference Point Session Dosage Given: 4 Gy
Session Number: 2

## 2021-06-26 MED ORDER — HYDROMORPHONE HCL 2 MG PO TABS
1.0000 mg | ORAL_TABLET | ORAL | Status: DC | PRN
  Administered 2021-06-26: 1 mg via ORAL
  Filled 2021-06-26: qty 1

## 2021-06-26 MED ORDER — BISACODYL 10 MG RE SUPP
10.0000 mg | Freq: Every day | RECTAL | Status: DC | PRN
  Administered 2021-06-26: 10 mg via RECTAL

## 2021-06-26 NOTE — Progress Notes (Signed)
Overall, Mr. Raymond Henderson is feeling better.  I think that the kyphoplasty certainly has helped.  The radiation is helping.  He seems to be in a much better mood.  He is not hurting as much which is certainly helpful.  I do appreciate Hospice seeing him.  I do think that he would be very appropriate for Hospice.  He is eating.  It is hard to say how much he is eating.  I know that physical therapy is working with him.  He does have a bladder catheter in.  A lot of this needs to be taken out.  He says he was prefers to have this in the right now.  There has been no fever.  He has had no bleeding.  He has had no problems with bowels or bladder.  His vital signs are temperature of 97.7.  Pulse 103.  Blood pressure 130/74.  His lungs sound clear bilaterally.  Cardiac exam regular rate and rhythm.  Abdomen is soft.  There may be some slight distention.  Bowel sounds are present although may be a little bit decreased.  Extremity shows no clubbing, cyanosis or edema.  [name] is getting radiation therapy.  Hopefully, this will further help his back.  I think the kyphoplasty definitely has helped his back.  I do appreciate the outstanding care he is getting from all the staff on 6 E.  Lattie Haw, MD  Hebrews 12:12

## 2021-06-26 NOTE — Progress Notes (Signed)
Nutrition Follow-up   INTERVENTION:   -Ensure Plus High Protein po TID, each supplement provides 350 kcal and 20 grams of protein.   NUTRITION DIAGNOSIS:   Increased nutrient needs related to chronic illness (carcinoma) as evidenced by estimated needs.  Ongoing.  GOAL:   Patient will meet greater than or equal to 90% of their needs  Progressing.  MONITOR:   PO intake, Supplement acceptance, Labs, Weight trends, Skin, I & O's  ASSESSMENT:   71 year old male presents with 5 weeks of back pain. MRI imaging of the thoracolumbar spine which identified widespread osseous metastatic disease seen in the lumbar spine sacrum and pelvis with an associated pathologic L1 fracture with evidence for early epidural extension of tumor into the ventral epidural space. CT chest/abdomen/pelvis with contrast showed findings compatible with primary ascending colon carcinoma. Pt with abnormal imaging findings concerning for metastatic colon cancer.  6/6: s/p liver biopsy 6/7: T11 and L1 vertebral biopsy and kyphoplasty   Patient currently consuming 50-100% of meals at this time. Accepting Ensure supplements most of the time.  Per MD note, expected to discharge to SNF.  Admission weight: 184 lbs No new weights this admission  Medications: Senokot, Dulcolax  Labs reviewed:  Low Phos  Diet Order:   Diet Order             Diet regular Room service appropriate? Yes; Fluid consistency: Thin  Diet effective now                   EDUCATION NEEDS:   Not appropriate for education at this time  Skin:  Skin Assessment: Skin Integrity Issues: Skin Integrity Issues:: Incisions Incisions: abdomen  Last BM:  6/12 -type 5  Height:   Ht Readings from Last 1 Encounters:  06/18/21 '5\' 9"'$  (1.753 m)    Weight:   Wt Readings from Last 1 Encounters:  06/18/21 83.5 kg    BMI:  Body mass index is 27.17 kg/m.  Estimated Nutritional Needs:   Kcal:  2050-2250  Protein:  105-115  grams  Fluid:  >/= 2 L/day  Clayton Bibles, MS, RD, LDN Inpatient Clinical Dietitian Contact information available via Amion

## 2021-06-26 NOTE — Progress Notes (Signed)
   06/26/21 1643  Assess: MEWS Score  Temp 97.7 F (36.5 C)  BP 140/75  MAP (mmHg) 92  Pulse Rate (!) 117  Resp 18  SpO2 95 %  Assess: MEWS Score  MEWS Temp 0  MEWS Systolic 0  MEWS Pulse 2  MEWS RR 0  MEWS LOC 0  MEWS Score 2  MEWS Score Color Yellow  Assess: if the MEWS score is Yellow or Red  Were vital signs taken at a resting state? Yes  Focused Assessment No change from prior assessment  MEWS guidelines implemented *See Row Information* Yes  Take Vital Signs  Increase Vital Sign Frequency  Yellow: Q 2hr X 2 then Q 4hr X 2, if remains yellow, continue Q 4hrs  Notify: Charge Nurse/RN  Name of Charge Nurse/RN Notified Ekua RN  Date Charge Nurse/RN Notified 06/26/21  Time Charge Nurse/RN Notified 1643  Notify: Provider  Provider Name/Title Debbe Odea MD  Date Provider Notified 06/26/21  Time Provider Notified 1700  Method of Notification Page  Notification Reason Change in status (Tachy)  Provider response No new orders  Assess: SIRS CRITERIA  SIRS Temperature  0  SIRS Pulse 1  SIRS Respirations  0  SIRS WBC 0  SIRS Score Sum  1

## 2021-06-26 NOTE — Progress Notes (Signed)
[facility] Progress Note  Patient: Raymond Henderson     POE:[id number]  DOA: 06/17/2021   PCP: Kathalene Frames, MD       Brief hospital course: This is a 71 year old male who was admitted to the hospital on 06/17/2021 for back pain, abdominal pain and constipation that has been going on for about 5 weeks.  He has not seen a PCP in many years. In the ED: Found to have ascending colon mass with liver vertebral and pelvic metastasis, T11 L1 pathological fractures. 6/5 ultrasound-guided liver biopsy> poorly differentiated adenocarcinoma 6/7-T11 L1 vertebral biopsy> high-grade neuroendocrine carcinoma favor small cell - Kyphoplasty performed on the same day  Palliative radiation started yesterday.  Plan to go to skilled nursing facility.  As he needed a couple of doses of IV Dilaudid last night, we have held off on transferring him to SNF today I will follow him on only oral pain meds PRN in addition to the fentanyl patch to see if his pain is controlled.  Subjective:  Continues to state that his pain is controlled.  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 Pathologic T11 and L1 fracture Elevated LFTs -Back pain and abdominal pain -Plan for palliative care with palliative radiation to his spine to control pain -Status post vertebral kyphoplasty to help control pain -Currently on fentanyl patch at 25 mcg with as needed IV Dilaudid and oxycodone- - if radiation can help reduce the dose of narcotics he is taking, it will be helpful - The patient has not yet a candidate for hospice home as he is eating quite well -Following PT notes-the patient is having significant difficulty ambulating - looking for SNF - Pain much better controlled  until last night when he used 2 doses of IV Dilaudid w/o any Oxycodone - Switch IV Dilaudid to oral today so that he has only oral meds - If he has a need for  multiple doses, may need to increase the Fentanyl patch -Continue MiraLAX and senna twice daily and change Dulcolax suppository from twice daily to daily as needed    Active Problems: Hypotension, hyponatremia - 3/15> Systolic BP in the 40G this morning due to Coreg and poor oral intake on the day before - resolved with IVF and holding Coreg - cont to hold Coreg    Hypercalcemia -Related to malignancy - Resolved after IV fluids    Toxic encephalopathy -Related to pain medications- resolved  Hypoalbuminemia - related to malignancy    DVT prophylaxis:  heparin injection 5,000 Units Start: 06/20/21 2200 SCDs Start: 06/18/21 0749     Code Status: DNR  Consultants: Medical oncology, radiation oncology, palliative care Level of Care: Level of care: Med-Surg Disposition Plan:  Status is: Inpatient Remains inpatient appropriate because:Needs a skilled nursing facility  Objective:   Vitals:   06/25/21 0457 06/25/21 1339 06/25/21 1957 06/26/21 0620  BP: 125/88 134/82 (!) 129/93 130/74  Pulse: (!) 106 100 100 (!) 103  Resp: '18  18 18  '$ Temp: 97.8 F (36.6 C) 98.2 F (36.8 C) 98.3 F (36.8 C) 97.7 F (36.5 C)  TempSrc: Oral Oral Oral Oral  SpO2: 97% 97% 95% 95%  Weight:      Height:       Filed Weights   06/18/21 0835  Weight: 83.5 kg   Exam: General exam: Appears comfortable  HEENT: PERRLA, oral mucosa moist, no sclera icterus or thrush Respiratory system: Clear to auscultation. Respiratory  effort normal. Cardiovascular system: S1 & S2 heard, regular rate and rhythm Gastrointestinal system: Abdomen soft, non-tender, nondistended. Normal bowel sounds   Central nervous system: Alert and oriented. No focal neurological deficits. Extremities: No cyanosis, clubbing or edema Skin: No rashes or ulcers Psychiatry:  Mood & affect appropriate.     Imaging and lab data was personally reviewed    CBC: Recent Labs  Lab 06/20/21 0359 06/21/21 0248 06/24/21 0953  WBC  16.3* 15.2* 15.3*  HGB 11.9* 10.4* 11.1*  HCT 36.8* 32.4* 35.3*  MCV 82.1 82.7 84.4  PLT 332 283 734    Basic Metabolic Panel: Recent Labs  Lab 06/20/21 0542 06/21/21 0248 06/24/21 0953  NA 133* 133* 136  K 3.9 3.6 3.9  CL 96* 99 98  CO2 '25 24 28  '$ GLUCOSE 137* 102* 156*  BUN 17 25* 22  CREATININE 0.86 0.85 0.67  CALCIUM 10.7* 9.3 8.7*  MG  --  2.0  --   PHOS  --  2.1*  --     GFR: Estimated Creatinine Clearance: 83.5 mL/min (by C-G formula based on SCr of 0.67 mg/dL).  Scheduled Meds:  bisacodyl  10 mg Rectal BID   feeding supplement  237 mL Oral TID BM   fentaNYL  1 patch Transdermal Q72H   heparin injection (subcutaneous)  5,000 Units Subcutaneous Q8H   senna-docusate  2 tablet Oral BID   Continuous Infusions:     LOS: 8 days   Author: Debbe Odea  06/26/2021 12:50 PM

## 2021-06-26 NOTE — Progress Notes (Signed)
   06/26/21 1643  What Happened  Was fall witnessed? No  Was patient injured? Unsure  Patient found on floor  Found by Staff-comment (RN, Financial controller)  Stated prior activity other (comment) (rolled out of bed)  Follow Up  MD notified Saima Rizwan  Time MD notified 6  Family notified Yes - comment (spouse)  Time family notified 1729  Adult Fall Risk Assessment  Risk Factor Category (scoring not indicated) High fall risk per protocol (document High fall risk);Fall has occurred during this admission (document High fall risk)  Age 71  Fall History: Fall within 6 months prior to admission 0  Elimination; Bowel and/or Urine Incontinence 0  Elimination; Bowel and/or Urine Urgency/Frequency 0  Medications: includes PCA/Opiates, Anti-convulsants, Anti-hypertensives, Diuretics, Hypnotics, Laxatives, Sedatives, and Psychotropics 5  Patient Care Equipment 0  Mobility-Assistance 2  Mobility-Gait 2  Mobility-Sensory Deficit 0  Altered awareness of immediate physical environment 0  Impulsiveness 2  Lack of understanding of one's physical/cognitive limitations 4  Total Score 17  Patient Fall Risk Level High fall risk  Adult Fall Risk Interventions  Required Bundle Interventions *See Row Information* High fall risk - low, moderate, and high requirements implemented  Additional Interventions Use of appropriate toileting equipment (bedpan, BSC, etc.)  Vitals  Temp 97.7 F (36.5 C)  Temp Source Oral  BP 140/75  MAP (mmHg) 92  BP Location Left Arm  Patient Position (if appropriate) Lying  Pulse Rate (!) 117  Pulse Rate Source Dinamap  Resp 18  Oxygen Therapy  SpO2 95 %

## 2021-06-26 NOTE — Progress Notes (Signed)
Physical Therapy Treatment Patient Details Name: Raymond Henderson MRN: [id number] DOB: 1950-11-14 Today's Date: 06/26/2021   History of Present Illness Pt is a 71 y/o male admitted secondary to worsening back pain. 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 Found to have liver mets and suspected primary lesion in colon. Workup pending. No pertinent PMH.    PT Comments    Patient making good progress with mobility and ambulated ~45' with RW and min guard for safety. Pt's steps low and trunk flexed due to pain and fatigue. Encouraged time OOB for meals and educated on LE exercises pt can complete while sitting in recliner. Will continue to progress mobility as able. Recommendation for SNF for rehab remains appropriate at this time.    Recommendations for follow up therapy are one component of a multi-disciplinary discharge planning process, led by the attending physician.  Recommendations may be updated based on patient status, additional functional criteria and insurance authorization.  Follow Up Recommendations  Skilled nursing-short term rehab (<3 hours/day)     Assistance Recommended at Discharge Frequent or constant Supervision/Assistance  Patient can return home with the following A little help with walking and/or transfers;A little help with bathing/dressing/bathroom;Assistance with cooking/housework;Help with stairs or ramp for entrance;Assist for transportation   Equipment Recommendations  Rolling walker (2 wheels)    Recommendations for Other Services       Precautions / Restrictions Precautions Precautions: Back;Fall Precaution Booklet Issued: No Precaution Comments: Provided education on log roll for comfort Restrictions Weight Bearing Restrictions: No     Mobility  Bed Mobility Overal bed mobility: Needs Assistance Bed Mobility: Supine to Sit     Supine to sit: HOB elevated, Min guard     General bed mobility comments:  guarding with cues for log roll technique. HOB slightly elevated and pt using bed rail to roll and raise trunk.    Transfers Overall transfer level: Needs assistance Equipment used: Rolling walker (2 wheels) Transfers: Sit to/from Stand Sit to Stand: Min guard           General transfer comment: VC's for hand placement to power up, pt steady with rise and guarding only for safety.    Ambulation/Gait Ambulation/Gait assistance: Min assist, Min guard Gait Distance (Feet): 45 Feet Assistive device: Rolling walker (2 wheels) Gait Pattern/deviations: Step-through pattern, Decreased stride length, Trunk flexed Gait velocity: decr     General Gait Details: pt with slow guarded gait, no overt LOB throughout. pt steps with reduced hip/knee flexion due to weakness.   Stairs             Wheelchair Mobility    Modified Rankin (Stroke Patients Only)       Balance Overall balance assessment: Needs assistance Sitting-balance support: Feet supported Sitting balance-Leahy Scale: Fair     Standing balance support: Bilateral upper extremity supported, During functional activity, Reliant on assistive device for balance Standing balance-Leahy Scale: Poor Standing balance comment: reliant on RW for gait                            Cognition Arousal/Alertness: Awake/alert Behavior During Therapy: WFL for tasks assessed/performed Overall Cognitive Status: Within Functional Limits for tasks assessed  Exercises General Exercises - Lower Extremity Ankle Circles/Pumps: AROM, Both, 20 reps Long Arc Quad: AROM, Both, 10 reps Hip Flexion/Marching: AROM, Both, 10 reps    General Comments        Pertinent Vitals/Pain Pain Assessment Pain Assessment: 0-10 Faces Pain Scale: Hurts little more Pain Location: mid back Pain Descriptors / Indicators: Discomfort Pain Intervention(s): Limited activity within  patient's tolerance, Monitored during session, Repositioned, RN gave pain meds during session    Home Living                          Prior Function            PT Goals (current goals can now be found in the care plan section) Acute Rehab PT Goals Patient Stated Goal: to decrease pain PT Goal Formulation: With patient Time For Goal Achievement: 07/02/21 Potential to Achieve Goals: Good Progress towards PT goals: Progressing toward goals    Frequency    Min 3X/week      PT Plan Current plan remains appropriate    Co-evaluation              AM-PAC PT "6 Clicks" Mobility   Outcome Measure  Help needed turning from your back to your side while in a flat bed without using bedrails?: A Little Help needed moving from lying on your back to sitting on the side of a flat bed without using bedrails?: A Little Help needed moving to and from a bed to a chair (including a wheelchair)?: A Little Help needed standing up from a chair using your arms (e.g., wheelchair or bedside chair)?: A Little Help needed to walk in hospital room?: A Little Help needed climbing 3-5 steps with a railing? : Total 6 Click Score: 16    End of Session Equipment Utilized During Treatment: Gait belt Activity Tolerance: Patient tolerated treatment well Patient left: in chair;with call bell/phone within reach;with chair alarm set Nurse Communication: Mobility status PT Visit Diagnosis: Unsteadiness on feet (R26.81);Muscle weakness (generalized) (M62.81);Difficulty in walking, not elsewhere classified (R26.2);Pain     Time: 1030-1050 PT Time Calculation (min) (ACUTE ONLY): 20 min  Charges:  $Gait Training: 8-22 mins                     Verner Mould, DPT Acute Rehabilitation Services Office [phone] Pager [phone]  06/26/21 12:26 PM

## 2021-06-26 NOTE — Plan of Care (Signed)
Pt reporting pain 4/10 this morning. Denied need for any PRNs at this time.  No further complaints.   Problem: Education: Goal: Knowledge of General Education information will improve Description: Including pain rating scale, medication(s)/side effects and non-pharmacologic comfort measures Outcome: Progressing   Problem: Health Behavior/Discharge Planning: Goal: Ability to manage health-related needs will improve Outcome: Progressing   Problem: Clinical Measurements: Goal: Ability to maintain clinical measurements within normal limits will improve Outcome: Progressing Goal: Will remain free from infection Outcome: Progressing Goal: Diagnostic test results will improve Outcome: Progressing Goal: Respiratory complications will improve Outcome: Progressing Goal: Cardiovascular complication will be avoided Outcome: Progressing   Problem: Activity: Goal: Risk for activity intolerance will decrease Outcome: Progressing   Problem: Nutrition: Goal: Adequate nutrition will be maintained Outcome: Progressing   Problem: Coping: Goal: Level of anxiety will decrease Outcome: Progressing   Problem: Elimination: Goal: Will not experience complications related to bowel motility Outcome: Progressing Goal: Will not experience complications related to urinary retention Outcome: Progressing   Problem: Safety: Goal: Ability to remain free from injury will improve Outcome: Progressing   Problem: Skin Integrity: Goal: Risk for impaired skin integrity will decrease Outcome: Progressing

## 2021-06-26 NOTE — Progress Notes (Signed)
We were asked if Mr. Qu treatment could be consolidated into a shorter course given his plans to discharge to hospice after completing therapy. Dr. Lisbeth Renshaw is in agreement to shorten his treatment to 5 fractions and he will finish this Friday. I communicated this with his partner Ms. Faucette.    Carola Rhine, PAC

## 2021-06-27 ENCOUNTER — Ambulatory Visit
Admit: 2021-06-27 | Discharge: 2021-06-27 | Disposition: A | Discharge status: Home / Self Care | Payer: Medicare Other | Attending: Radiation Oncology | Admitting: Radiation Oncology

## 2021-06-27 ENCOUNTER — Other Ambulatory Visit: Payer: Self-pay

## 2021-06-27 DIAGNOSIS — C7951 Secondary malignant neoplasm of bone: Secondary | ICD-10-CM | POA: Diagnosis not present

## 2021-06-27 DIAGNOSIS — Z7189 Other specified counseling: Secondary | ICD-10-CM | POA: Diagnosis not present

## 2021-06-27 DIAGNOSIS — C778 Secondary and unspecified malignant neoplasm of lymph nodes of multiple regions: Secondary | ICD-10-CM | POA: Diagnosis not present

## 2021-06-27 DIAGNOSIS — Z515 Encounter for palliative care: Secondary | ICD-10-CM | POA: Diagnosis not present

## 2021-06-27 LAB — RAD ONC ARIA SESSION SUMMARY
Course Elapsed Days: 2
Plan Fractions Treated to Date: 2
Plan Fractions Treated to Date: 2
Plan Prescribed Dose Per Fraction: 4 Gy
Plan Prescribed Dose Per Fraction: 4 Gy
Plan Total Fractions Prescribed: 4
Plan Total Fractions Prescribed: 4
Plan Total Prescribed Dose: 16 Gy
Plan Total Prescribed Dose: 16 Gy
Reference Point Dosage Given to Date: 11 Gy
Reference Point Dosage Given to Date: 11 Gy
Reference Point Session Dosage Given: 4 Gy
Reference Point Session Dosage Given: 4 Gy
Session Number: 3

## 2021-06-27 MED ORDER — GABAPENTIN 100 MG PO CAPS
100.0000 mg | ORAL_CAPSULE | Freq: Three times a day (TID) | ORAL | Status: DC
  Administered 2021-06-27 – 2021-06-29 (×6): 100 mg via ORAL
  Filled 2021-06-27 (×6): qty 1

## 2021-06-27 NOTE — Progress Notes (Signed)
OT Cancellation Note  Patient Details Name: Raymond Henderson MRN: [id number] DOB: [birth year]   Cancelled Treatment:    Reason Eval/Treat Not Completed: Fatigue/lethargy limiting ability to participate. Patient declined therapy stating he was tired and needing to rest from a busy day. Will f/u as able.  Corine Solorio L Kaityln Kallstrom 06/27/2021, 3:31 PM

## 2021-06-27 NOTE — Discharge Summary (Signed)
Physician Discharge Summary  Raymond Henderson XBD:[id number] DOB: November 21, 1950 DOA: 06/17/2021  PCP: Kathalene Frames, MD  Admit date: 06/17/2021 Discharge date: 06/29/2021 Recommendations for Outpatient Follow-up:  Follow up with PCP in 1 weeks-call for appointment Please obtain BMP/CBC in one week  Discharge Dispo:SNF Discharge Condition: Stable Code Status:   Code Status: DNR Diet recommendation:  Diet Order             Diet regular Room service appropriate? Yes; Fluid consistency: Thin  Diet effective now                 Brief/Interim Summary: 71 year old M with no known PMH presented to ED with back pain for about 5 weeks and abdominal pain and constipation for about 10 days and found to have ascending colon mass with liver, vertebral and pelvic lesions as well as T11 and L1 pathologic fracture without significant canal stenosis.  Underwent US-guided liver biopsy on 6/5.  Pathology with poorly differentiated adenocarcinoma.  He had T11 and L1 vertebral biopsy and kyphoplasty on 6/7. Path with metastatic high-grade neuroendocrine carcinoma, favor small cell  type.  Radiation oncology consulted and started palliative radiation on 6/8.  Oncology, radiation oncology and palliative medicine following.  Transferring to [facility] for radiation treatment.  Therapy recommended SNF.  Patient getting consolidative radiation therapy till Friday patient has a skilled nursing facility approved for 6/14, SNF will be able to transfer him daily for radiation therapy.  Palliative care ordered and over felt that he is very appropriate for hospice.overall is medically stable.  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 Continue current fentanyl patch, off IV opiates, on oral opiates at this time, completed radiation therapy 6/16, continue PT OT.He is not yet a candidate for hospice home as he is eating fairly okay.Continue PT OT, pending skilled nursing facility once patient along with his visiting brother and sister makes decision.     Hypotension-BP has stabilized.  He had drpped in the 80s due to Coreg and poor oral intake, Coreg on hold, BP improved with IV fluids. Hypercalcemia:calcium last at 10. Normocytic anemia: hb stable last at 11 gm. Leucocytosis-monitor. Goals of care, counseling/discussion: DNR. Palliative care following. Hyponatremia:resolved Acute toxic encephalopathy: Due to pain medication resolved, mentating well.Monitor Physical deconditioning-cont pt ot, needs SNF  Consults: Radiation oncology, heme-onc IR for kyphoplasty  Subjective: Alert awake oriented pain is controlled resting comfortably.  Discharge Exam: Vitals:   06/28/21 1947 06/29/21 0347  BP: 125/67 120/79  Pulse: (!) 109 (!) 106  Resp: 16 16  Temp: 97.9 F (36.6 C) 99.2 F (37.3 C)  SpO2: 98% 93%   General: Pt is alert, awake, not in acute distress Cardiovascular: RRR, S1/S2 +, no rubs, no gallops Respiratory: CTA bilaterally, no wheezing, no rhonchi Abdominal:  Soft, NT, ND, bowel sounds + Extremities: no edema, no cyanosis  Discharge Instructions  Discharge Instructions     Discharge  instructions   Complete by: As directed    Follow-up with palliative care at the facility.  Follow-up with oncology team.  Please call call MD or return to ER for similar or worsening recurring problem that brought you to hospital or if any fever,nausea/vomiting,abdominal pain, uncontrolled pain, chest pain,  shortness of breath or any other alarming symptoms.  Please follow-up your doctor as instructed in a week time and call the office for appointment.  Please avoid alcohol, smoking, or any other illicit substance and maintain healthy habits including taking your regular medications as prescribed.  You were cared for by a hospitalist during your hospital stay. If you have any questions about your discharge medications or the care you received while you were in the hospital after you are discharged, you can call the unit and ask to speak with the hospitalist on call if the hospitalist that took care of you is not available.  Once you are discharged, your primary care physician will handle any further medical issues. Please note that NO REFILLS for any discharge medications will be authorized once you are discharged, as it is imperative that you return to your primary care physician (or establish a relationship with a primary care physician if you do not have one) for your aftercare needs so that they can reassess your need for medications and monitor your lab values   Increase activity slowly   Complete by: As directed    No wound care   Complete by: As directed       Allergies as of 06/29/2021       Reactions   Penicillins Other (See Comments)   Unknown reaction as a teenager        Medication List     STOP taking these medications    acetaminophen 650 MG CR tablet Commonly known as: TYLENOL Replaced by: acetaminophen 325 MG tablet   benzonatate 200 MG capsule Commonly known as: TESSALON   cyclobenzaprine 5 MG tablet Commonly known as: FLEXERIL   metroNIDAZOLE 500 MG  tablet Commonly known as: FLAGYL       TAKE these medications    acetaminophen 325 MG tablet Commonly known as: TYLENOL Take 2 tablets (650 mg total) by mouth every 6 (six) hours as needed for mild pain (or Fever >/= 101). Replaces: acetaminophen 650 MG CR tablet   bisacodyl 10 MG suppository Commonly known as: DULCOLAX Place 1 suppository (10 mg total) rectally daily as needed for moderate constipation.   feeding supplement Liqd Take 237 mLs by mouth 3 (three) times daily between meals.   fentaNYL 25 MCG/HR Commonly known as: [place] 1 patch onto the skin every 3 (three) days. Start taking on: July 02, 2021   gabapentin 300 MG capsule Commonly known as: NEURONTIN Take 300 mg by mouth every 8 (eight) hours.   ondansetron 4 MG tablet Commonly known as: ZOFRAN Take 1 tablet (4 mg total) by mouth every 6 (six) hours as needed for nausea.   Oxycodone HCl 10 MG Tabs Take 1 tablet (10 mg total) by mouth every 6 (six) hours as needed for up to 5 doses for moderate pain or severe pain.   polyethylene glycol 17 g packet Commonly known as: MIRALAX / GLYCOLAX Take 17 g by mouth 2 (two) times daily as needed for mild constipation.   senna-docusate 8.6-50 MG tablet Commonly known  as: Senokot-S Take 2 tablets by mouth 2 (two) times daily.        Follow-up Information     Kathalene Frames, MD Follow up in 1 week(s).   Specialty: Internal Medicine Contact information: [address], [address] [place] [address] [phone]         Volanda Napoleon, MD Follow up in 1 week(s).   Specialty: Oncology Contact information: [place] [address] [phone]                Allergies  Allergen Reactions   Penicillins Other (See Comments)    Unknown reaction as a teenager    The results of significant diagnostics from this hospitalization (including imaging, microbiology, ancillary and laboratory) are listed  below for reference.    Microbiology: No results found for this or any previous visit (from the past 240 hour(s)).   Procedures/Studies: IR KYPHO THORACIC WITH BONE BIOPSY  Result Date: 06/20/2021 INDICATION: Raymond Henderson is a 71 year old male with no significant past medical history who presented to ED on 06/17/21 due to back pain. 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 Patient rates pain as 9-10/10. She comes to our service for a fluoroscopy guided core bone biopsy, radiofrequency ablation and balloon kyphoplasty. EXAM: FLUOROSCOPY GUIDED T11 AND L1 CORE BONE BIOPSY, RADIOFREQUENCY ABLATION AND BALLOON KYPHOPLASTY COMPARISON:  None Available. MEDICATIONS: Vancomycin 1 g IV; The antibiotic was administered in an appropriate time interval prior to needle puncture of the skin. ANESTHESIA/SEDATION: A total of 4 mg of Versed and 200 mcg of fentanyl were administered intravenously for moderate conscious sedation monitored under my direct supervision. Total intraservice time of sedation was 87 minutes. The patient's vital signs were monitored throughout the procedure and recorded in the patient's medical record by the nurse. FLUOROSCOPY: Radiation Exposure Index (as provided by the fluoroscopic device): 4438 mGy Kerma PSD. COMPLICATIONS: None immediate. TECHNIQUE: Informed written consent was obtained from the patient after a thorough discussion of the procedural risks, benefits and alternatives. All questions were addressed. Maximal Sterile Barrier Technique was utilized including caps, mask, sterile gowns, sterile gloves, sterile drape, hand hygiene and skin antiseptic. A timeout was performed prior to the initiation of the procedure. The patient was placed in prone position on the angiography table. The thoracolumbar region was prepped and draped in a sterile fashion. Under  fluoroscopy, the T11 vertebral body was delineated and the skin area was marked. The skin was infiltrated with a 1% Lidocaine approximately 3.3 cm lateral to the spinous process projection on the right. Using a 22-gauge spinal needle, the soft issue and the peripedicular space and periosteum were infiltrated with Bupivacaine 0.5%. A skin incision was made at the access site. Subsequently, an 11-gauge Kyphon trocar was inserted under fluoroscopic guidance until contact with the pedicle was obtained. The trocar was inserted into the pedicle with light hammer tapping until the posterior boundaries of the vertebral body was reached. The diamond mandrill was removed and one core biopsy was obtained. The skin was infiltrated with a 1% Lidocaine approximately 3.2 cm lateral to the spinous process projection on the left. Using a 22-gauge spinal needle, the soft issue and the peripedicular space and periosteum were infiltrated with Bupivacaine 0.5%. A skin incision was made at the access site. Subsequently, an 11-gauge Kyphon trocar was inserted under fluoroscopic guidance until contact with the pedicle  was obtained. The trocar was inserted with light hammer tapping into the pedicle until the posterior boundaries of the vertebral body was reached. A bone drill was coaxially advanced within the anterior third of the vertebral body on each side for proper RF probes size selection. An OsteoCool RF probe was inserted bilaterally within the mid-posterior third of the vertebral body. The radiofrequency ablation cycle was performed. Thereafter, the RF probes were exchanged for inflatable Kyphon balloons, which were centered within the mid-aspect of the vertebral body. The balloons were inflated to create a void to serve as a repository for the bone cement. Both balloons were deflated and through both cannulas, under continuous fluoroscopy guidance in the AP and lateral views, the vertebral body was filled with previously mixed  polymethyl-methacrylate (PMMA) added to barium for opacification. Both cannulas were later removed. Next, the L1 vertebral body was delineated under fluoroscopy and the skin area was marked. The skin was infiltrated with a 1% Lidocaine approximately 4.5 cm lateral to the spinous process projection on the right. Using a 22-gauge spinal needle, the soft issue and the peripedicular space and periosteum were infiltrated with Bupivacaine 0.5%. A skin incision was made at the access site. Subsequently, an 11-gauge Kyphon trocar was inserted under fluoroscopic guidance until contact with the pedicle was obtained. The trocar was inserted into the pedicle with light hammer tapping until the posterior boundaries of the vertebral body was reached. The diamond mandrill was removed and one core biopsy was obtained. The skin was infiltrated with a 1% Lidocaine approximately 4.5 cm lateral to the spinous process projection on the left. Using a 22-gauge spinal needle, the soft issue and the peripedicular space and periosteum were infiltrated with Bupivacaine 0.5%. A skin incision was made at the access site. Subsequently, an 11-gauge Kyphon trocar was inserted under fluoroscopic guidance until contact with the pedicle was obtained. The trocar was inserted with light hammer tapping into the pedicle until the posterior boundaries of the vertebral body was reached. A bone drill was coaxially advanced within the anterior third of the vertebral body on each side for proper RF probes size selection. An OsteoCool RF probe was inserted bilaterally within the mid-posterior third of the vertebral body. The radiofrequency ablation cycle was performed. Thereafter, the RF probes were exchanged for inflatable Kyphon balloons, which were centered within the mid-aspect of the vertebral body. The balloons were inflated to create a void to serve as a repository for the bone cement. Both balloons were deflated and through both cannulas, under  continuous fluoroscopy guidance in the AP and lateral views, the vertebral body was filled with previously mixed polymethyl-methacrylate (PMMA) added to barium for opacification. Both cannulas were later removed. The skin was cleaned and access sites were covered with sterile bandages. FINDINGS: Pathologic fractures of the T11 and L1 vertebral bodies, as seen on prior cross-sectional imaging. IMPRESSION: Successful and uncomplicated core bone biopsy of the T11 and L1 vertebral bodies followed by radiofrequency ablation and balloon kyphoplasty for treatment of symptomatic pathologic fractures. Electronically Signed   By: Pedro Earls M.D.   On: 06/20/2021 13:38   IR Bone Tumor(s)RF Ablation  Result Date: 06/20/2021 INDICATION: Raymond Henderson is a 71 year old male with no significant past medical history who presented to ED on 06/17/21 due to back pain. 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 Patient rates pain as 9-10/10. She comes to  our service for a fluoroscopy guided core bone biopsy, radiofrequency ablation and balloon kyphoplasty. EXAM: FLUOROSCOPY GUIDED T11 AND L1 CORE BONE BIOPSY, RADIOFREQUENCY ABLATION AND BALLOON KYPHOPLASTY COMPARISON:  None Available. MEDICATIONS: Vancomycin 1 g IV; The antibiotic was administered in an appropriate time interval prior to needle puncture of the skin. ANESTHESIA/SEDATION: A total of 4 mg of Versed and 200 mcg of fentanyl were administered intravenously for moderate conscious sedation monitored under my direct supervision. Total intraservice time of sedation was 87 minutes. The patient's vital signs were monitored throughout the procedure and recorded in the patient's medical record by the nurse. FLUOROSCOPY: Radiation Exposure Index (as provided by the fluoroscopic device): 4438 mGy Kerma PSD. COMPLICATIONS: None immediate.  TECHNIQUE: Informed written consent was obtained from the patient after a thorough discussion of the procedural risks, benefits and alternatives. All questions were addressed. Maximal Sterile Barrier Technique was utilized including caps, mask, sterile gowns, sterile gloves, sterile drape, hand hygiene and skin antiseptic. A timeout was performed prior to the initiation of the procedure. The patient was placed in prone position on the angiography table. The thoracolumbar region was prepped and draped in a sterile fashion. Under fluoroscopy, the T11 vertebral body was delineated and the skin area was marked. The skin was infiltrated with a 1% Lidocaine approximately 3.3 cm lateral to the spinous process projection on the right. Using a 22-gauge spinal needle, the soft issue and the peripedicular space and periosteum were infiltrated with Bupivacaine 0.5%. A skin incision was made at the access site. Subsequently, an 11-gauge Kyphon trocar was inserted under fluoroscopic guidance until contact with the pedicle was obtained. The trocar was inserted into the pedicle with light hammer tapping until the posterior boundaries of the vertebral body was reached. The diamond mandrill was removed and one core biopsy was obtained. The skin was infiltrated with a 1% Lidocaine approximately 3.2 cm lateral to the spinous process projection on the left. Using a 22-gauge spinal needle, the soft issue and the peripedicular space and periosteum were infiltrated with Bupivacaine 0.5%. A skin incision was made at the access site. Subsequently, an 11-gauge Kyphon trocar was inserted under fluoroscopic guidance until contact with the pedicle was obtained. The trocar was inserted with light hammer tapping into the pedicle until the posterior boundaries of the vertebral body was reached. A bone drill was coaxially advanced within the anterior third of the vertebral body on each side for proper RF probes size selection. An OsteoCool RF probe  was inserted bilaterally within the mid-posterior third of the vertebral body. The radiofrequency ablation cycle was performed. Thereafter, the RF probes were exchanged for inflatable Kyphon balloons, which were centered within the mid-aspect of the vertebral body. The balloons were inflated to create a void to serve as a repository for the bone cement. Both balloons were deflated and through both cannulas, under continuous fluoroscopy guidance in the AP and lateral views, the vertebral body was filled with previously mixed polymethyl-methacrylate (PMMA) added to barium for opacification. Both cannulas were later removed. Next, the L1 vertebral body was delineated under fluoroscopy and the skin area was marked. The skin was infiltrated with a 1% Lidocaine approximately 4.5 cm lateral to the spinous process projection on the right. Using a 22-gauge spinal needle, the soft issue and the peripedicular space and periosteum were infiltrated with Bupivacaine 0.5%. A skin incision was made at the access site. Subsequently, an 11-gauge Kyphon trocar was inserted under fluoroscopic guidance until contact with the pedicle was obtained. The trocar was  inserted into the pedicle with light hammer tapping until the posterior boundaries of the vertebral body was reached. The diamond mandrill was removed and one core biopsy was obtained. The skin was infiltrated with a 1% Lidocaine approximately 4.5 cm lateral to the spinous process projection on the left. Using a 22-gauge spinal needle, the soft issue and the peripedicular space and periosteum were infiltrated with Bupivacaine 0.5%. A skin incision was made at the access site. Subsequently, an 11-gauge Kyphon trocar was inserted under fluoroscopic guidance until contact with the pedicle was obtained. The trocar was inserted with light hammer tapping into the pedicle until the posterior boundaries of the vertebral body was reached. A bone drill was coaxially advanced within the  anterior third of the vertebral body on each side for proper RF probes size selection. An OsteoCool RF probe was inserted bilaterally within the mid-posterior third of the vertebral body. The radiofrequency ablation cycle was performed. Thereafter, the RF probes were exchanged for inflatable Kyphon balloons, which were centered within the mid-aspect of the vertebral body. The balloons were inflated to create a void to serve as a repository for the bone cement. Both balloons were deflated and through both cannulas, under continuous fluoroscopy guidance in the AP and lateral views, the vertebral body was filled with previously mixed polymethyl-methacrylate (PMMA) added to barium for opacification. Both cannulas were later removed. The skin was cleaned and access sites were covered with sterile bandages. FINDINGS: Pathologic fractures of the T11 and L1 vertebral bodies, as seen on prior cross-sectional imaging. IMPRESSION: Successful and uncomplicated core bone biopsy of the T11 and L1 vertebral bodies followed by radiofrequency ablation and balloon kyphoplasty for treatment of symptomatic pathologic fractures. Electronically Signed   By: Pedro Earls M.D.   On: 06/20/2021 13:38   IR Bone Tumor(s)RF Ablation  Result Date: 06/20/2021 INDICATION: Raymond Henderson is a 71 year old male with no significant past medical history who presented to ED on 06/17/21 due to back pain. 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 Patient rates pain as 9-10/10. She comes to our service for a fluoroscopy guided core bone biopsy, radiofrequency ablation and balloon kyphoplasty. EXAM: FLUOROSCOPY GUIDED T11 AND L1 CORE BONE BIOPSY, RADIOFREQUENCY ABLATION AND BALLOON KYPHOPLASTY COMPARISON:  None Available. MEDICATIONS: Vancomycin 1 g IV; The antibiotic was administered in an appropriate time  interval prior to needle puncture of the skin. ANESTHESIA/SEDATION: A total of 4 mg of Versed and 200 mcg of fentanyl were administered intravenously for moderate conscious sedation monitored under my direct supervision. Total intraservice time of sedation was 87 minutes. The patient's vital signs were monitored throughout the procedure and recorded in the patient's medical record by the nurse. FLUOROSCOPY: Radiation Exposure Index (as provided by the fluoroscopic device): 4438 mGy Kerma PSD. COMPLICATIONS: None immediate. TECHNIQUE: Informed written consent was obtained from the patient after a thorough discussion of the procedural risks, benefits and alternatives. All questions were addressed. Maximal Sterile Barrier Technique was utilized including caps, mask, sterile gowns, sterile gloves, sterile drape, hand hygiene and skin antiseptic. A timeout was performed prior to the initiation of the procedure. The patient was placed in prone position on the angiography table. The thoracolumbar region was prepped and draped in a sterile fashion. Under fluoroscopy, the T11 vertebral body was delineated and the skin area was marked. The skin was infiltrated with a 1% Lidocaine approximately 3.3 cm lateral  to the spinous process projection on the right. Using a 22-gauge spinal needle, the soft issue and the peripedicular space and periosteum were infiltrated with Bupivacaine 0.5%. A skin incision was made at the access site. Subsequently, an 11-gauge Kyphon trocar was inserted under fluoroscopic guidance until contact with the pedicle was obtained. The trocar was inserted into the pedicle with light hammer tapping until the posterior boundaries of the vertebral body was reached. The diamond mandrill was removed and one core biopsy was obtained. The skin was infiltrated with a 1% Lidocaine approximately 3.2 cm lateral to the spinous process projection on the left. Using a 22-gauge spinal needle, the soft issue and the  peripedicular space and periosteum were infiltrated with Bupivacaine 0.5%. A skin incision was made at the access site. Subsequently, an 11-gauge Kyphon trocar was inserted under fluoroscopic guidance until contact with the pedicle was obtained. The trocar was inserted with light hammer tapping into the pedicle until the posterior boundaries of the vertebral body was reached. A bone drill was coaxially advanced within the anterior third of the vertebral body on each side for proper RF probes size selection. An OsteoCool RF probe was inserted bilaterally within the mid-posterior third of the vertebral body. The radiofrequency ablation cycle was performed. Thereafter, the RF probes were exchanged for inflatable Kyphon balloons, which were centered within the mid-aspect of the vertebral body. The balloons were inflated to create a void to serve as a repository for the bone cement. Both balloons were deflated and through both cannulas, under continuous fluoroscopy guidance in the AP and lateral views, the vertebral body was filled with previously mixed polymethyl-methacrylate (PMMA) added to barium for opacification. Both cannulas were later removed. Next, the L1 vertebral body was delineated under fluoroscopy and the skin area was marked. The skin was infiltrated with a 1% Lidocaine approximately 4.5 cm lateral to the spinous process projection on the right. Using a 22-gauge spinal needle, the soft issue and the peripedicular space and periosteum were infiltrated with Bupivacaine 0.5%. A skin incision was made at the access site. Subsequently, an 11-gauge Kyphon trocar was inserted under fluoroscopic guidance until contact with the pedicle was obtained. The trocar was inserted into the pedicle with light hammer tapping until the posterior boundaries of the vertebral body was reached. The diamond mandrill was removed and one core biopsy was obtained. The skin was infiltrated with a 1% Lidocaine approximately 4.5 cm  lateral to the spinous process projection on the left. Using a 22-gauge spinal needle, the soft issue and the peripedicular space and periosteum were infiltrated with Bupivacaine 0.5%. A skin incision was made at the access site. Subsequently, an 11-gauge Kyphon trocar was inserted under fluoroscopic guidance until contact with the pedicle was obtained. The trocar was inserted with light hammer tapping into the pedicle until the posterior boundaries of the vertebral body was reached. A bone drill was coaxially advanced within the anterior third of the vertebral body on each side for proper RF probes size selection. An OsteoCool RF probe was inserted bilaterally within the mid-posterior third of the vertebral body. The radiofrequency ablation cycle was performed. Thereafter, the RF probes were exchanged for inflatable Kyphon balloons, which were centered within the mid-aspect of the vertebral body. The balloons were inflated to create a void to serve as a repository for the bone cement. Both balloons were deflated and through both cannulas, under continuous fluoroscopy guidance in the AP and lateral views, the vertebral body was filled with previously mixed polymethyl-methacrylate (PMMA) added  to barium for opacification. Both cannulas were later removed. The skin was cleaned and access sites were covered with sterile bandages. FINDINGS: Pathologic fractures of the T11 and L1 vertebral bodies, as seen on prior cross-sectional imaging. IMPRESSION: Successful and uncomplicated core bone biopsy of the T11 and L1 vertebral bodies followed by radiofrequency ablation and balloon kyphoplasty for treatment of symptomatic pathologic fractures. Electronically Signed   By: Pedro Earls M.D.   On: 06/20/2021 13:38   IR KYPHO EA ADDL LEVEL THORACIC OR LUMBAR  Result Date: 06/20/2021 INDICATION: Raymond Henderson is a 71 year old male with no significant past medical history who presented to ED on 06/17/21 due to back  pain. 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 Patient rates pain as 9-10/10. She comes to our service for a fluoroscopy guided core bone biopsy, radiofrequency ablation and balloon kyphoplasty. EXAM: FLUOROSCOPY GUIDED T11 AND L1 CORE BONE BIOPSY, RADIOFREQUENCY ABLATION AND BALLOON KYPHOPLASTY COMPARISON:  None Available. MEDICATIONS: Vancomycin 1 g IV; The antibiotic was administered in an appropriate time interval prior to needle puncture of the skin. ANESTHESIA/SEDATION: A total of 4 mg of Versed and 200 mcg of fentanyl were administered intravenously for moderate conscious sedation monitored under my direct supervision. Total intraservice time of sedation was 87 minutes. The patient's vital signs were monitored throughout the procedure and recorded in the patient's medical record by the nurse. FLUOROSCOPY: Radiation Exposure Index (as provided by the fluoroscopic device): 4438 mGy Kerma PSD. COMPLICATIONS: None immediate. TECHNIQUE: Informed written consent was obtained from the patient after a thorough discussion of the procedural risks, benefits and alternatives. All questions were addressed. Maximal Sterile Barrier Technique was utilized including caps, mask, sterile gowns, sterile gloves, sterile drape, hand hygiene and skin antiseptic. A timeout was performed prior to the initiation of the procedure. The patient was placed in prone position on the angiography table. The thoracolumbar region was prepped and draped in a sterile fashion. Under fluoroscopy, the T11 vertebral body was delineated and the skin area was marked. The skin was infiltrated with a 1% Lidocaine approximately 3.3 cm lateral to the spinous process projection on the right. Using a 22-gauge spinal needle, the soft issue and the peripedicular space and periosteum were infiltrated with Bupivacaine 0.5%. A skin  incision was made at the access site. Subsequently, an 11-gauge Kyphon trocar was inserted under fluoroscopic guidance until contact with the pedicle was obtained. The trocar was inserted into the pedicle with light hammer tapping until the posterior boundaries of the vertebral body was reached. The diamond mandrill was removed and one core biopsy was obtained. The skin was infiltrated with a 1% Lidocaine approximately 3.2 cm lateral to the spinous process projection on the left. Using a 22-gauge spinal needle, the soft issue and the peripedicular space and periosteum were infiltrated with Bupivacaine 0.5%. A skin incision was made at the access site. Subsequently, an 11-gauge Kyphon trocar was inserted under fluoroscopic guidance until contact with the pedicle was obtained. The trocar was inserted with light hammer tapping into the pedicle until the posterior boundaries of the vertebral body was reached. A bone drill was coaxially advanced within the anterior third of the vertebral body on each side for proper RF probes size selection. An OsteoCool RF probe was inserted bilaterally within the mid-posterior third of the vertebral body. The radiofrequency ablation cycle was performed. Thereafter, the RF probes were exchanged  for inflatable Kyphon balloons, which were centered within the mid-aspect of the vertebral body. The balloons were inflated to create a void to serve as a repository for the bone cement. Both balloons were deflated and through both cannulas, under continuous fluoroscopy guidance in the AP and lateral views, the vertebral body was filled with previously mixed polymethyl-methacrylate (PMMA) added to barium for opacification. Both cannulas were later removed. Next, the L1 vertebral body was delineated under fluoroscopy and the skin area was marked. The skin was infiltrated with a 1% Lidocaine approximately 4.5 cm lateral to the spinous process projection on the right. Using a 22-gauge spinal needle,  the soft issue and the peripedicular space and periosteum were infiltrated with Bupivacaine 0.5%. A skin incision was made at the access site. Subsequently, an 11-gauge Kyphon trocar was inserted under fluoroscopic guidance until contact with the pedicle was obtained. The trocar was inserted into the pedicle with light hammer tapping until the posterior boundaries of the vertebral body was reached. The diamond mandrill was removed and one core biopsy was obtained. The skin was infiltrated with a 1% Lidocaine approximately 4.5 cm lateral to the spinous process projection on the left. Using a 22-gauge spinal needle, the soft issue and the peripedicular space and periosteum were infiltrated with Bupivacaine 0.5%. A skin incision was made at the access site. Subsequently, an 11-gauge Kyphon trocar was inserted under fluoroscopic guidance until contact with the pedicle was obtained. The trocar was inserted with light hammer tapping into the pedicle until the posterior boundaries of the vertebral body was reached. A bone drill was coaxially advanced within the anterior third of the vertebral body on each side for proper RF probes size selection. An OsteoCool RF probe was inserted bilaterally within the mid-posterior third of the vertebral body. The radiofrequency ablation cycle was performed. Thereafter, the RF probes were exchanged for inflatable Kyphon balloons, which were centered within the mid-aspect of the vertebral body. The balloons were inflated to create a void to serve as a repository for the bone cement. Both balloons were deflated and through both cannulas, under continuous fluoroscopy guidance in the AP and lateral views, the vertebral body was filled with previously mixed polymethyl-methacrylate (PMMA) added to barium for opacification. Both cannulas were later removed. The skin was cleaned and access sites were covered with sterile bandages. FINDINGS: Pathologic fractures of the T11 and L1 vertebral  bodies, as seen on prior cross-sectional imaging. IMPRESSION: Successful and uncomplicated core bone biopsy of the T11 and L1 vertebral bodies followed by radiofrequency ablation and balloon kyphoplasty for treatment of symptomatic pathologic fractures. Electronically Signed   By: Pedro Earls M.D.   On: 06/20/2021 13:38   US BIOPSY (LIVER)  Result Date: 06/19/2021 INDICATION: 71 year old male with history of ascending colonic mass and multifocal liver masses, presumed metastases. EXAM: ULTRASOUND GUIDED LIVER LESION BIOPSY COMPARISON:  None Available. MEDICATIONS: None ANESTHESIA/SEDATION: Fentanyl 25 mcg IV; Versed 0.5 mg IV Total Moderate Sedation time:  10 minutes. The patient's level of consciousness and vital signs were monitored continuously by radiology nursing throughout the procedure under my direct supervision. COMPLICATIONS: None immediate. PROCEDURE: Informed written consent was obtained from the patient after a discussion of the risks, benefits and alternatives to treatment. The patient understands and consents the procedure. A timeout was performed prior to the initiation of the procedure. Ultrasound scanning was performed of the right upper abdominal quadrant demonstrates hypoechoic mass in the left lobe of the liver with anterior hepatic capsular bulging.  Left lobe liver mass was selected for biopsy and the procedure was planned. The right upper abdominal quadrant was prepped and draped in the usual sterile fashion. The overlying soft tissues were anesthetized with 1% lidocaine with epinephrine. A 17 gauge, 6.8 cm co-axial needle was advanced into a peripheral aspect of the lesion. This was followed by 3 core biopsies with an 18 gauge core device under direct ultrasound guidance. The coaxial needle tract was embolized with a small amount of Gel-Foam slurry and superficial hemostasis was obtained with manual compression. Post procedural scanning was negative for  definitive area of hemorrhage or additional complication. A dressing was placed. The patient tolerated the procedure well without immediate post procedural complication. IMPRESSION: Technically successful ultrasound guided core needle biopsy of left lobe liver mass. Ruthann Cancer, MD [facility] Electronically Signed   By: Ruthann Cancer M.D.   On: 06/19/2021 17:28   CT CHEST ABDOMEN PELVIS W CONTRAST  Result Date: 06/18/2021 CLINICAL DATA:  Metastatic disease.  Staging. EXAM: CT CHEST, ABDOMEN, AND PELVIS WITH CONTRAST TECHNIQUE: Multidetector CT imaging of the chest, abdomen and pelvis was performed following the standard protocol during bolus administration of intravenous contrast. RADIATION DOSE REDUCTION: This exam was performed according to the departmental dose-optimization program which includes automated exposure control, adjustment of the mA and/or kV according to patient size and/or use of iterative reconstruction technique. CONTRAST:  154m OMNIPAQUE IOHEXOL 300 MG/ML  SOLN COMPARISON:  MRI thoracic and lumbar spine 06/18/2021 FINDINGS: CT CHEST FINDINGS Cardiovascular: The heart size appears within normal limits. No pericardial effusion identified. Aortic atherosclerosis and coronary artery calcifications. Mediastinum/Nodes: Thyroid gland, trachea and esophagus are unremarkable. Subcarinal lymph node measures 1.5 cm, image 23/3. No enlarged axillary, supraclavicular, or hilar lymph nodes. Lungs/Pleura: No pleural effusion. Mild changes of emphysema. No suspicious pulmonary nodule or mass. Musculoskeletal: Multifocal lytic bone metastases identified involving the ribs, sternum and spine. Index lesions include: -expansile lesion with associated pathologic fracture involves the anterolateral aspect of the right second rib measuring 2.1 cm, image 19/5. -lesion involving the sternal manubrium measures 0.9 cm, 26/5. -lesion involving the T11 vertebra  measures 3.6 cm and extends into the right posterior elements, image 125/5. Associated pathologic compression fracture is noted with loss of approximately 25% of the vertebral body height. (See thoracic spine MRI for further details regarding the thoracic spine metastasis.) CT ABDOMEN PELVIS FINDINGS Hepatobiliary: Diffuse liver metastases are identified throughout both lobes of the liver. Lesions are too numerous to count. Index lesions include: -segment 2 lesion measuring 2.4 x 2.0 cm, image 44/3. -partially exophytic lesion involving segment 6 measures 2.1 x 1.7 cm, image 62/3. -segment 4B lesion measures 2.1 x 1.4 cm, image 5/3. The gallbladder appears surgically absent. Pancreas: Unremarkable. No pancreatic ductal dilatation or surrounding inflammatory changes. Spleen: Normal in size without focal abnormality. Adrenals/Urinary Tract: Adrenal glands are unremarkable. Kidneys are normal, without renal calculi, focal lesion, or hydronephrosis. Bladder is unremarkable. Stomach/Bowel: Stomach appears normal. No signs of small bowel wall thickening, inflammation, or distension. Circumferential mass is identified involving the mid to distal ascending colon measures 4.4 x 5.2 by 8.9 cm, image 67/3. There is significant luminal narrowing involving the affected segment without signs of bowel obstruction. Transmural extension of tumor is identified with increased pericolonic soft tissue infiltration. Vascular/Lymphatic: Aortic atherosclerosis without aneurysm. Upper abdominal adenopathy is identified: -portacaval lymph node measures 2.3 cm short axis, image 53/3. -porta hepatic lymph node measures 2.3 cm, image 511/9 -ileocolic mesenteric lymph node  measures 2.9 cm, image 70/3. No pelvic or inguinal adenopathy. Reproductive: Mild prostate gland enlargement. Other: No ascites. Peritoneal nodule within the left hemiabdomen is identified measuring 8 mm, image 70/3. Musculoskeletal: Multifocal lytic bone metastases identified  involving the lumbar spine and bony pelvis. Index lesions include: -L2 vertebral body lesion measures 2.7 cm, image 72/7. -left inferior pubic rami lesion measures 1.5 cm, image 130/3. -right acetabular lesion measures 0.9 cm, image 111/3. Bilateral proximal femur lesions are identified: -Lesion involving the lesser trochanter of the proximal left femur measures 1.7 cm, image 127/3. -intertrochanteric lesion involving the proximal right femur measures 2.3 cm, image 126/3. IMPRESSION: 1. 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 Transmural extension of tumor is identified with increased pericolonic soft tissue infiltration. There is significant luminal narrowing involving the affected segment of colon without signs of high-grade bowel obstruction. 2. Extensive liver metastases. 3. Upper abdominal and mesenteric adenopathy compatible with metastatic adenopathy. Enlarged subcarinal lymph node compatible with metastatic disease. Please see report from MRI of the thoracic and lumbar spine, also performed today. 4. There are lytic lesions involving bilateral proximal femurs which may be of orthopedic significance. 5. Coronary artery calcifications noted. 6. Aortic Atherosclerosis (ICD10-I70.0) and Emphysema (ICD10-J43.9). Electronically Signed   By: Kerby Moors M.D.   On: 06/18/2021 05:36   MR Lumbar Spine W Wo Contrast  Result Date: 06/18/2021 CLINICAL DATA:  Initial evaluation for trauma, severe back pain. EXAM: MRI LUMBAR SPINE WITHOUT AND WITH CONTRAST TECHNIQUE: Multiplanar and multiecho pulse sequences of the lumbar spine were obtained without and with intravenous contrast. CONTRAST:  8.110m GADAVIST GADOBUTROL 1 MMOL/ML IV SOLN COMPARISON:  None Available. FINDINGS: Segmentation: Standard. Lowest well-formed disc space labeled the L5-S1 level. Alignment: Physiologic with preservation of the normal lumbar lordosis. No listhesis. Vertebrae: Widespread osseous metastatic disease seen  throughout the lumbar spine as well as the visualized sacrum and pelvis. There is involvement of essentially all levels. Scattered posterior element involvement seen at numerable levels as well. Associated pathologic fracture at L1 with mild 20% height loss. Evidence for early epidural extension of tumor into the ventral epidural space at L1 and L5 (series 27, images 9, 7). Conus medullaris and cauda equina: Conus extends to the L1 level. Conus and cauda equina appear normal. No abnormal intramedullary enhancement. Paraspinal and other soft tissues: Mild extraosseous extension of tumor with involvement of the adjacent paraspinous soft tissues noted at multiple levels, most notable at the right transverse process of L3 (series 32, image 19). Scattered edema within the lower posterior paraspinous musculature likely reactive. Enlarged aortocaval lymph nodes measuring up to 1.5 cm in short axis, concerning for nodal metastatic disease (series 29, images 5, 10). Disc levels: T12-L1: Pathologic compression fracture of L1 with mild epidural extension of tumor. Secondary flattening of the ventral thecal sac, eccentric to the left. No significant spinal stenosis at this time. Foramina remain patent. L1-2:  Unremarkable. L2-3: Small left extraforaminal disc protrusion with annular fissure. Mild facet hypertrophy. No stenosis. L3-4: Disc desiccation with mild disc bulge. Mild facet hypertrophy. No stenosis. L4-5: Disc desiccation with mild disc bulge. Mild to moderate facet hypertrophy. No significant stenosis. L5-S1: Degenerative intervertebral disc space narrowing with diffuse disc bulge and reactive endplate change. Epidural extension of tumor into the right ventral epidural space (series 29, image 33). Mild to moderate bilateral facet hypertrophy. Mild narrowing of the right lateral recess without significant spinal stenosis. Mild left L5 foraminal narrowing. Right neural foramen remains patent. IMPRESSION: 1. 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 2. Associated pathologic fracture of L1 with mild 20% height loss. Evidence for early epidural extension of tumor into the ventral epidural space at L1 and L5. No significant spinal stenosis at this time. 3. Enlarged aortocaval lymph nodes, concerning for nodal metastatic disease. 4. Underlying mild multilevel degenerative spondylosis as above, most pronounced at L5-S1. Electronically Signed   By: Jeannine Boga M.D.   On: 06/18/2021 01:16   MR THORACIC SPINE W WO CONTRAST  Result Date: 06/18/2021 CLINICAL DATA:  Initial evaluation for acute back pain, trauma. EXAM: MRI THORACIC WITHOUT AND WITH CONTRAST TECHNIQUE: Multiplanar and multiecho pulse sequences of the thoracic spine were obtained without and with intravenous contrast. CONTRAST:  8.41m GADAVIST GADOBUTROL 1 MMOL/ML IV SOLN COMPARISON:  None Available. FINDINGS: Alignment: Physiologic with preservation of the normal thoracic kyphosis. Vertebrae: There are innumerable T1 hypointense, stir hyperintense enhancing lesions seen throughout the thoracic spine, consistent with widespread osseous metastatic disease. There is involvement of essentially all levels. Posterior elements are involved at numerous levels as well. Associated pathologic fracture at T11 with up to 35% height loss. Evidence for early epidural extension into the ventral epidural space at T7, T9, and T11. Prominent extra osseous extension with soft tissue component seen about the left rib and transverse process of T7, measuring approximately 3.3 x 2.4 cm (series 20, image 20). Multiple lesion seen involving the partially visualized posterior ribs as well. No significant or high-grade spinal stenosis at this time. Cord: Early epidural extension at the levels of T7, T9, and T11. No high-grade spinal stenosis or frank cord impingement. Cord itself is normal in caliber in appearance. No  abnormal intramedullary enhancement. Paraspinal and other soft tissues: Extraosseous extension with associated soft tissue component about the left transverse process of T7 as above. More mild extraosseous extension also noted on the right at T4 and on the left at T9. Innumerable lesions seen throughout the partially visualized liver, likely reflecting metastatic disease. Disc levels: No significant underlying disc pathology. No high-grade spinal stenosis. Mild foraminal narrowing on the right at T6-7 and T11-12 related to extraosseous extension of tumor. Foramina otherwise remain patent at this time. IMPRESSION: 1. Widespread osseous metastatic disease throughout the thoracic spine as above. 2. Associated pathologic fracture at T11 with up to 35% height loss. 3. Evidence for early epidural extension at the levels of T7, T9, and T11. No high-grade spinal stenosis or frank cord impingement at this time. 4. Innumerable lesions throughout the partially visualized liver, likely reflecting metastatic disease. Electronically Signed   By: BJeannine BogaM.D.   On: 06/18/2021 01:07   DG Chest Portable 1 View  Result Date: 06/17/2021 CLINICAL DATA:  Cough with leukocytosis. EXAM: PORTABLE CHEST 1 VIEW COMPARISON:  None Available. FINDINGS: Multiple overlying radiopaque cardiac lead wires are noted. The heart size and mediastinal contours are within normal limits. Both lungs are clear. An ill-defined lytic area is seen involving the lateral aspect of the second left rib. IMPRESSION: 1. No acute infiltrate or pleural effusion. 2. Ill-defined lytic area involving the lateral aspect of the second left rib. Further evaluation with nonemergent chest CT is recommended. Electronically Signed   By: TVirgina NorfolkM.D.   On: 06/17/2021 22:41    Labs: BNP (last 3 results) No results for input(s): "BNP" in the last 8760 hours. Basic Metabolic Panel: Recent Labs  Lab 06/24/21 0953  NA 136  K 3.9  CL 98  CO2 28   GLUCOSE 156*  BUN 22  CREATININE 0.67  CALCIUM 8.7*   Liver Function Tests: No results for input(s): "AST", "ALT", "ALKPHOS", "BILITOT", "PROT", "ALBUMIN" in the last 168 hours.  No results for input(s): "LIPASE", "AMYLASE" in the last 168 hours. No results for input(s): "AMMONIA" in the last 168 hours. CBC: Recent Labs  Lab 06/24/21 0953  WBC 15.3*  HGB 11.1*  HCT 35.3*  MCV 84.4  PLT 319   Cardiac Enzymes: No results for input(s): "CKTOTAL", "CKMB", "CKMBINDEX", "TROPONINI" in the last 168 hours. BNP: Invalid input(s): "POCBNP" CBG: No results for input(s): "GLUCAP" in the last 168 hours. D-Dimer No results for input(s): "DDIMER" in the last 72 hours. Hgb A1c No results for input(s): "HGBA1C" in the last 72 hours. Lipid Profile No results for input(s): "CHOL", "HDL", "LDLCALC", "TRIG", "CHOLHDL", "LDLDIRECT" in the last 72 hours. Thyroid function studies No results for input(s): "TSH", "T4TOTAL", "T3FREE", "THYROIDAB" in the last 72 hours.  Invalid input(s): "FREET3" Anemia work up No results for input(s): "VITAMINB12", "FOLATE", "FERRITIN", "TIBC", "IRON", "RETICCTPCT" in the last 72 hours. Urinalysis    Component Value Date/Time   COLORURINE YELLOW 06/18/2021 0436   APPEARANCEUR CLEAR 06/18/2021 0436   LABSPEC 1.012 06/18/2021 0436   PHURINE 7.0 06/18/2021 0436   GLUCOSEU NEGATIVE 06/18/2021 0436   HGBUR NEGATIVE 06/18/2021 0436   BILIRUBINUR NEGATIVE 06/18/2021 0436   KETONESUR NEGATIVE 06/18/2021 0436   PROTEINUR NEGATIVE 06/18/2021 0436   NITRITE NEGATIVE 06/18/2021 0436   LEUKOCYTESUR NEGATIVE 06/18/2021 0436   Sepsis Labs Recent Labs  Lab 06/24/21 0953  WBC 15.3*   Microbiology No results found for this or any previous visit (from the past 240 hour(s)).    Time coordinating discharge: 25 minutes  SIGNED: Antonieta Pert, MD  [facility] 06/29/2021, 12:15 PM  If 7PM-7AM, please contact night-coverage [email or web address]

## 2021-06-27 NOTE — Progress Notes (Addendum)
PROGRESS NOTE Raymond Henderson  KGM:[id number] DOB: [birth year] DOA: 06/17/2021 PCP: Kathalene Frames, MD   Brief Narrative/Hospital Course: 71 year old M with no known PMH presented to ED with back pain for about 5 weeks and abdominal pain and constipation for about 10 days and found to have ascending colon mass with liver, vertebral and pelvic lesions as well as T11 and L1 pathologic fracture without significant canal stenosis.  Underwent US-guided liver biopsy on 6/5.  Pathology with poorly differentiated adenocarcinoma.  He had T11 and L1 vertebral biopsy and kyphoplasty on 6/7. Path with metastatic high-grade neuroendocrine carcinoma, favor small cell  type.  Radiation oncology consulted and started palliative radiation on 6/8.  Oncology, radiation oncology and palliative medicine following.  Transferring to [facility] for radiation treatment.  Therapy recommended SNF.  Patient getting consolidative radiation therapy till Friday patient has a skilled nursing facility approved for 6/14, SNF will be able to transfer him daily for radiation therapy.  Palliative care ordered and over felt that he is very appropriate for hospice.overall is medically stable.    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  Continue current fentanyl patch, weaning IV opiates to p.o. oxy.  Planning for XRT until Friday.  He is not yet a candidate for hospice home as he is eating quite well.  Continue PT OT, looking for skilled nursing facility.Continuing bowel regimen.  Hypotension-BP stable now.  Dropped in the 80s due to Coreg and poor oral intake, Coreg on hold, BP improved with IV fluids. Hypercalcemia:calcium last at 10. Normocytic anemia: hb stable last at 11 gm. Leucocytosis- last wbc 15.3 monitor. Goals of care, counseling/discussion: DNR. Palliative care following.  Hyponatremia:resolved Acute toxic encephalopathy: Due to pain medication resolved.Monitor Physical deconditioning-cont pt ot, needs SNF  DVT prophylaxis: heparin injection 5,000 Units Start: 06/20/21 2200 SCDs Start: 06/18/21 0749 Code Status:   Code Status: DNR Family Communication: plan of care discussed with patient/Bonnie at bedside. Patient status is: inpatient because of pending placement Level of care: Med-Surg   Dispo: The patient is from: home            Anticipated disposition: SNF, as per his friend Horris Latino they are waiting patient's brother and sister to arrive from [place] to make decision and does not want patient to be discharged until then  Mobility Assessment (last 72 hours)     Mobility Assessment     Row Name 06/26/21 2324 06/26/21 1042 06/26/21 0830 06/25/21 1954 06/25/21 0852   Does patient have an order for bedrest or is patient medically unstable No - Continue assessment -- No - Continue assessment Yes- Bedfast (Level 1) - Complete No - Continue assessment   What is the highest level of mobility based on the progressive mobility assessment? Level 5 (  Walks with assist in room/hall) - Balance while stepping forward/back and can walk in room with assist - Complete Level 5 (Walks with assist in  room/hall) - Balance while stepping forward/back and can walk in room with assist - Complete Level 5 (Walks with assist in room/hall) - Balance while stepping forward/back and can walk in room with assist - Complete Level 1 (Bedfast) - Unable to balance while sitting on edge of bed --   Is the above level different from baseline mobility prior to current illness? Yes - Recommend PT order -- -- Yes - Recommend PT order --    [place] Name 06/24/21 2100 06/24/21 1539         Does patient have an order for bedrest or is patient medically unstable No - Continue assessment --      What is the highest level of mobility based on the progressive mobility assessment? Level 4 (Walks with assist in room) - Balance while marching in place and cannot step forward and back - Complete Level 5 (Walks with assist in room/hall) - Balance while stepping forward/back and can walk in room with assist - Complete      Is the above level different from baseline mobility prior to current illness? Yes - Recommend PT order --                Objective: Vitals last 24 hrs: Vitals:   06/26/21 1643 06/26/21 1904 06/26/21 2040 06/27/21 0651  BP: 140/75 114/71 (!) 145/88 116/72  Pulse: (!) 117 (!) 111 (!) 114 (!) 109  Resp: '18 17 18   '$ Temp: 97.7 F (36.5 C) 98.4 F (36.9 C) 98.2 F (36.8 C) 98.2 F (36.8 C)  TempSrc: Oral Oral Oral Oral  SpO2: 95% 96% 97% 95%  Weight:      Height:       Weight change:   Physical Examination: General exam: alert awake,older than stated age, weak appearing. HEENT:Oral mucosa moist, Ear/Nose WNL grossly, dentition normal. Respiratory system: bilaterally diminished BS, no use of accessory muscle Cardiovascular system: S1 & S2 +, No JVD. Gastrointestinal system: Abdomen soft,NT,ND, BS+ Nervous System:Alert, awake, moving extremities and grossly nonfocal Extremities: LE edema neg,distal peripheral pulses palpable.  Skin: No rashes,no icterus. MSK: Normal muscle bulk,tone,  power  Medications reviewed:  Scheduled Meds:  feeding supplement  237 mL Oral TID BM   fentaNYL  1 patch Transdermal Q72H   heparin injection (subcutaneous)  5,000 Units Subcutaneous Q8H   senna-docusate  2 tablet Oral BID   Continuous Infusions:    Diet Order             Diet regular Room service appropriate? Yes; Fluid consistency: Thin  Diet effective now                    Nutrition Problem: Increased nutrient needs Etiology: chronic illness (carcinoma) Signs/Symptoms: estimated needs Interventions: Ensure Enlive (each supplement provides 350kcal and 20 grams of protein)   Intake/Output Summary (Last 24 hours) at 06/27/2021 1405 Last data filed at 06/27/2021 1008 Gross per 24 hour  Intake 240 ml  Output 1200 ml  Net -960 ml   Net IO Since Admission: -1,502.1 mL [06/27/21 1405]  Wt Readings from Last 3 Encounters:  06/18/21 83.5 kg     Unresulted Labs (From admission, onward)    None     Data Reviewed: I have personally reviewed following labs and imaging studies CBC: Recent Labs  Lab 06/21/21 0248 06/24/21 0953  WBC  15.2* 15.3*  HGB 10.4* 11.1*  HCT 32.4* 35.3*  MCV 82.7 84.4  PLT 283 811   Basic Metabolic Panel: Recent Labs  Lab 06/21/21 0248 06/24/21 0953  NA 133* 136  K 3.6 3.9  CL 99 98  CO2 24 28  GLUCOSE 102* 156*  BUN 25* 22  CREATININE 0.85 0.67  CALCIUM 9.3 8.7*  MG 2.0  --   PHOS 2.1*  --    GFR: Estimated Creatinine Clearance: 83.5 mL/min (by C-G formula based on SCr of 0.67 mg/dL). Liver Function Tests: Recent Labs  Lab 06/21/21 0248  ALBUMIN 2.3*   No results for input(s): "LIPASE", "AMYLASE" in the last 168 hours. No results for input(s): "AMMONIA" in the last 168 hours. Coagulation Profile: No results for input(s): "INR", "PROTIME" in the last 168 hours. BNP (last 3 results) No results for input(s): "PROBNP" in the last 8760 hours. HbA1C: No results for input(s): "HGBA1C" in the last 72 hours. CBG: No  results for input(s): "GLUCAP" in the last 168 hours. Lipid Profile: No results for input(s): "CHOL", "HDL", "LDLCALC", "TRIG", "CHOLHDL", "LDLDIRECT" in the last 72 hours. Thyroid Function Tests: No results for input(s): "TSH", "T4TOTAL", "FREET4", "T3FREE", "THYROIDAB" in the last 72 hours. Sepsis Labs: No results for input(s): "PROCALCITON", "LATICACIDVEN" in the last 168 hours.  Recent Results (from the past 240 hour(s))  Culture, blood (Routine X 2) w Reflex to ID Panel     Status: None   Collection Time: 06/18/21  5:09 AM   Specimen: BLOOD LEFT HAND  Result Value Ref Range Status   Specimen Description BLOOD LEFT HAND  Final   Special Requests   Final    AEROBIC BOTTLE ONLY Blood Culture results may not be optimal due to an inadequate volume of blood received in culture bottles   Culture   Final    NO GROWTH 5 DAYS Performed at [facility], [place] [address] [place], [place] [address]    Report Status 06/23/2021 FINAL  Final  Culture, blood (Routine X 2) w Reflex to ID Panel     Status: None   Collection Time: 06/18/21  5:18 AM   Specimen: BLOOD  Result Value Ref Range Status   Specimen Description BLOOD LEFT ANTECUBITAL  Final   Special Requests AEROBIC BOTTLE ONLY Blood Culture adequate volume  Final   Culture   Final    NO GROWTH 5 DAYS Performed at [facility] [address], [place], [place] [address]    Report Status 06/23/2021 FINAL  Final    Antimicrobials: Anti-infectives (From admission, onward)    Start     Dose/Rate Route Frequency Ordered Stop   06/20/21 1045  vancomycin (VANCOCIN) IVPB 1000 mg/200 mL premix        1,000 mg 200 mL/hr over 60 Minutes Intravenous To Radiology 06/20/21 0950 06/20/21 1316      Culture/Microbiology    Component Value Date/Time   SDES BLOOD LEFT ANTECUBITAL 06/18/2021 0518   SPECREQUEST AEROBIC BOTTLE ONLY Blood Culture adequate volume 06/18/2021 0518   CULT  06/18/2021 0518    NO GROWTH 5 DAYS Performed at  [facility], [place] [address], [place], [place] [address]    REPTSTATUS 06/23/2021 FINAL 06/18/2021 0518  Radiology Studies: No results found.   LOS: 9 days   Antonieta Pert, MD [facility]  06/27/2021, 2:05 PM

## 2021-06-27 NOTE — Progress Notes (Signed)
Daily Progress Note   Patient Name: Raymond Henderson       Date: 06/27/2021 DOB: [birth year]  Age: 71 y.o. MRN#: [id number] Attending Physician: Antonieta Pert, MD Primary Care Physician: Kathalene Frames, MD Admit Date: 06/17/2021 Length of Stay: 9 days  Reason for Consultation/Follow-up: Establishing goals of care and Hospice Evaluation  HPI/Patient Profile:  71 y.o. male  with no significant past medical history admitted on 06/17/2021 with severe midthoracic back pain for several weeks.    Patient reports that he last had a colonoscopy approximately 15 years ago in [place] and has not seen a PCP in approximately that many years. Upon arrival to ED, initial work-up identified widespread osseous metastatic disease seen in the lumbar spine sacrum and pelvis, an associated pathologic L1 fracture, liver metastases, and a mass in the ascending colon, likely the primary malignancy.  He was given options for chemo and associated prognosis: If no chemo estimated life expectancy of 2 months; with chemo (and IF it worked, 25-30% chance) then life expectancy of 1 year. He decided to not undergo chemo and opted for palliative radiation for symptom management, which is started today.   PMT has been consulted to assist with goals of care conversation.  Subjective:   Subjective: Chart Reviewed. Updates received. Patient Assessed. Created space and opportunity for patient  and family to explore thoughts and feelings regarding current medical situation.  Today's Discussion: Today I met at the patient's bedside with the patient.  Conversation with the patient was brief as he was about to undergo personal care and trend transport to palliative radiation.  He states his pain continues to be well managed.  No other significant complaints.    I called his significant other Raymond Henderson at her request through a message left on our team phone.  I expressed the plan for palliative radiation through this Friday (5 total  treatments) at which point discharge disposition would be settled.  At this point it appears the plan is to discharge home to SNF with facility transporting him for his last 1-2 radiation treatments.  She became very overwhelmed and stated that she has been supporting him through this and will continue to do so.  However, she does not think that he should be discharged before his brother and sister arrive later this evening so that they can help discuss discharge disposition and see what kind of support they would be able to offer.  I verbalized that discharge is not under the purview of palliative medicine but I would notify the hospitalist so that he can call and speak with her.  She appreciated this and anticipates his call.  I notified the primary hospitalist of patient's significant other's request to speak about concerns related to discharge.  I provided emotional and general support therapeutic listening, empathy, sharing of stories, and other techniques.  Answered all questions addressed all concerns to the best my ability.  Review of Systems  Constitutional:  Positive for fatigue.  Respiratory:  Negative for shortness of breath.   Cardiovascular:  Negative for chest pain.  Gastrointestinal:  Negative for abdominal pain, nausea and vomiting.    Objective:   Vital Signs:  BP 116/72   Pulse (!) 109   Temp 98.2 F (36.8 C) (Oral)   Resp 18   Ht '5\' 9"'  (1.753 m)   Wt 83.5 kg   SpO2 95%   BMI 27.17 kg/m   Physical Exam: Physical Exam Vitals and nursing note reviewed.  Constitutional:  General: He is not in acute distress.    Appearance: He is ill-appearing.  HENT:     Head: Normocephalic and atraumatic.  Pulmonary:     Effort: Pulmonary effort is normal. No respiratory distress.  Abdominal:     General: Abdomen is flat.  Skin:    General: Skin is warm and dry.  Neurological:     General: No focal deficit present.     Mental Status: He is alert.  Psychiatric:         Mood and Affect: Mood normal.        Behavior: Behavior normal.     Palliative Assessment/Data: 50-60%   Assessment & Plan:   Impression: Present on Admission:  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  Pathologic T11 and L1 fracture  Pathologic fracture of thoracic vertebrae, initial encounter  Hypercalcemia  Normocytic anemia  71 year old male with multiple health concerns as described above.  At this point significant cancer illness where and he has declined chemotherapy and prognosis per oncology is around 2 months.  They have transferred him to [facility] long hospital for palliative radiation to help with bony mets pain.  They have elected hospice care as well.  Initially considering assisted living versus long-term care, however the patient does not have resources for private pay for this level of care.  At this point, disposition may become tricky.  Short-term rehab facilities typically will not take patients undergoing radiation.  We can certainly reach out to radiation oncology on Monday to discuss the goal of radiation being palliative and see how many treatments they could get away with to control his symptoms and not have a prolonged course.  At this point palliative radiation has indicated they can shorten his treatment duration to 5 treatments lasting to the end of this week.  At this point it appears the discharge disposition is SNF/rehab.  I continue to express that he is a good hospice candidate, although is likely not eligible for residential hospice at this time.  Anticipate awaiting patient's family's arrival to assist with decision-making and discharge planning.  Overall prognosis poor, previously expressed prognosis for about 2 months.  SUMMARY OF RECOMMENDATIONS   Continue DNR Continue palliative radiation Continue to work towards disposition PMT will continue to follow peripherally Please call us for any significant changes in the meantime  Symptom Management:   Per primary team PMT is available to assist as needed  Code Status: DNR  Prognosis: < 3 months  Discharge Planning: To Be Determined  Discussed with: Medical team, nursing team, patient, patient's significant other, TOC team  Thank you for allowing [place] to participate in the care of Avishai Reihl PMT will continue to support holistically.  Billing based on MDM: High  Problems Addressed: One acute or chronic illness or injury that poses a threat to life or bodily function  Amount and/or Complexity of Data: Category 3:Discussion of management or test interpretation with external physician/other qualified health care professional/appropriate source (not separately reported)  Risks: Parenteral controlled substances and Decision not to resuscitate or to de-escalate care because of poor prognosis   Walden Field, NP Palliative Medicine Team  Team Phone # [phone] (Nights/Weekends)  09/12/2020, 8:17 AM

## 2021-06-27 NOTE — TOC Progression Note (Addendum)
Transition of Care [facility]) - Progression Note    Patient Details  Name: Raymond Henderson MRN: [id number] Date of Birth: Sep 20, 1950  Transition [facility]) CM/SW Contact  Devin Foskey, Marjie Skiff, RN Phone Number: 06/27/2021, 1:37 PM  Clinical Narrative:     Pt provided with SNF bed offers. Pt informed that the only SNF that can provide transportation to and from radiation is [facility]. Radiation oncology has decreased the number of radiation treatments that the pt will have now. He should be completed on Friday. Pt and friend Horris Latino were informed that radiation is generally an outpatient procedure and generally patient's do not stay in the hospital only for radiation. Pt friend Horris Latino states that the pt's family will be in town tomorrow and they are unsure that they can make that decision today. TOC will follow.  Addendum: [id number] BHALPFX back in with pt to see if he had decided to go to [facility] today. Pt states that he is unable to make that decision today and will speak with his family when they come into town tomorrow.  Expected Discharge Plan: [place] Barriers to Discharge: Continued Medical Work up, SNF Pending bed offer  Expected Discharge Plan and Services Expected Discharge Plan: [place] In-house Referral: Clinical Social Work   Post Acute Care Choice: [place] arrangements for the past 2 months: Apartment                   Readmission Risk Interventions     No data to display

## 2021-06-28 ENCOUNTER — Ambulatory Visit
Admit: 2021-06-28 | Discharge: 2021-06-28 | Disposition: A | Discharge status: Home / Self Care | Payer: Medicare Other | Attending: Radiation Oncology | Admitting: Radiation Oncology

## 2021-06-28 ENCOUNTER — Other Ambulatory Visit: Payer: Self-pay

## 2021-06-28 DIAGNOSIS — C7951 Secondary malignant neoplasm of bone: Secondary | ICD-10-CM | POA: Diagnosis not present

## 2021-06-28 DIAGNOSIS — Z515 Encounter for palliative care: Secondary | ICD-10-CM | POA: Diagnosis not present

## 2021-06-28 DIAGNOSIS — C778 Secondary and unspecified malignant neoplasm of lymph nodes of multiple regions: Secondary | ICD-10-CM | POA: Diagnosis not present

## 2021-06-28 DIAGNOSIS — Z7189 Other specified counseling: Secondary | ICD-10-CM | POA: Diagnosis not present

## 2021-06-28 LAB — RAD ONC ARIA SESSION SUMMARY
Course Elapsed Days: 3
Plan Fractions Treated to Date: 3
Plan Fractions Treated to Date: 3
Plan Prescribed Dose Per Fraction: 4 Gy
Plan Prescribed Dose Per Fraction: 4 Gy
Plan Total Fractions Prescribed: 4
Plan Total Fractions Prescribed: 4
Plan Total Prescribed Dose: 16 Gy
Plan Total Prescribed Dose: 16 Gy
Reference Point Dosage Given to Date: 15 Gy
Reference Point Dosage Given to Date: 15 Gy
Reference Point Session Dosage Given: 4 Gy
Reference Point Session Dosage Given: 4 Gy
Session Number: 4

## 2021-06-28 NOTE — Progress Notes (Signed)
Occupational Therapy Treatment Patient Details Name: Raymond Henderson MRN: [id number] DOB: August 28, 1950 Today's Date: 06/28/2021   History of present illness Pt is a 71 y/o male admitted secondary to worsening back pain. 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 Found to have liver mets and suspected primary lesion in colon. Workup pending. No pertinent PMH.   OT comments  Pt able to complete about 20' functional mobility this session. Limited by onset of wooziness in standing which worsened a bit while walking, alleviated once sitting in recliner. Needed up to max A with pericare in standing, close min guard to min A with mobility. Up in recliner at end of session.    Recommendations for follow up therapy are one component of a multi-disciplinary discharge planning process, led by the attending physician.  Recommendations may be updated based on patient status, additional functional criteria and insurance authorization.    Follow Up Recommendations  Skilled nursing-short term rehab (<3 hours/day)    Assistance Recommended at Discharge Frequent or constant Supervision/Assistance  Patient can return home with the following      Equipment Recommendations  Other (comment) (defer to next venue)    Recommendations for Other Services      Precautions / Restrictions Precautions Precautions: Back;Fall Precaution Booklet Issued: No       Mobility Bed Mobility Overal bed mobility: Needs Assistance Bed Mobility: Supine to Sit     Supine to sit: Min assist, HOB elevated     General bed mobility comments: assist to powerup trunk    Transfers Overall transfer level: Needs assistance Equipment used: Rolling walker (2 wheels) Transfers: Sit to/from Stand Sit to Stand: Min guard           General transfer comment: to/from EOB and recliner. used rw. cues for technique with rw.     Balance Overall balance assessment: Needs  assistance Sitting-balance support: Feet supported Sitting balance-Leahy Scale: Fair     Standing balance support: Bilateral upper extremity supported, During functional activity, Reliant on assistive device for balance Standing balance-Leahy Scale: Poor Standing balance comment: reliant on RW                           ADL either performed or assessed with clinical judgement   ADL Overall ADL's : Needs assistance/impaired                             Toileting- Clothing Manipulation and Hygiene: Moderate assistance;Maximal assistance;Sit to/from stand Toileting - Clothing Manipulation Details (indicate cue type and reason): min A to steady in standing, total assist for pericare in standing     Functional mobility during ADLs: Minimal assistance;Min guard General ADL Comments: Pt walked about the distance to access bathroom and return. Utilized rw, endorsed wooziness in standing which worsened with time, alleviated once sitting in recliner.    Extremity/Trunk Assessment Upper Extremity Assessment Upper Extremity Assessment: Generalized weakness   Lower Extremity Assessment Lower Extremity Assessment: Defer to PT evaluation        Vision       Perception     Praxis      Cognition Arousal/Alertness: Awake/alert Behavior During Therapy: WFL for tasks assessed/performed Overall Cognitive Status: Within Functional Limits for tasks assessed  Exercises      Shoulder Instructions       General Comments      Pertinent Vitals/ Pain       Pain Assessment Pain Assessment: Faces Faces Pain Scale: Hurts even more Pain Location: abdomen after elevating feet in recliner Pain Descriptors / Indicators: Grimacing Pain Intervention(s): Repositioned, Monitored during session, Other (comment) (lowered feet rest)  Home Living                                          Prior  Functioning/Environment              Frequency  Min 2X/week        Progress Toward Goals  OT Goals(current goals can now be found in the care plan section)  Progress towards OT goals: Progressing toward goals  Acute Rehab OT Goals Patient Stated Goal: not stated OT Goal Formulation: With patient/family Time For Goal Achievement: 07/03/21 Potential to Achieve Goals: Good ADL Goals Pt Will Transfer to Toilet: with modified independence;ambulating Pt Will Perform Toileting - Clothing Manipulation and hygiene: with modified independence;sitting/lateral leans;sit to/from stand Additional ADL Goal #1: Pt to verbalize at least 3 energy conservation strategies to implement during ADLs/IADLs Additional ADL Goal #2: Pt to increase standing activity tolerance > 7 min during functional tasks without significant pain increases  Plan Discharge plan remains appropriate    Co-evaluation                 AM-PAC OT "6 Clicks" Daily Activity     Outcome Measure   Help from another person eating meals?: A Little Help from another person taking care of personal grooming?: A Little Help from another person toileting, which includes using toliet, bedpan, or urinal?: A Lot Help from another person bathing (including washing, rinsing, drying)?: A Lot Help from another person to put on and taking off regular upper body clothing?: A Little Help from another person to put on and taking off regular lower body clothing?: A Lot 6 Click Score: 15    End of Session Equipment Utilized During Treatment: Rolling walker (2 wheels);Gait belt  OT Visit Diagnosis: Other abnormalities of gait and mobility (R26.89);Pain   Activity Tolerance Patient limited by fatigue;Other (comment) (wooziness in standing, worsened with time, alleviated by sitting down)   Patient Left in chair;with call bell/phone within reach;with chair alarm set   Nurse Communication          Time: 0950-1010 OT Time  Calculation (min): 20 min  Charges: OT General Charges $OT Visit: 1 Visit OT Treatments $Self Care/Home Management : 8-22 mins  Tyrone Schimke, OT Acute Rehabilitation Services Office: [phone]   Hortencia Pilar 06/28/2021, 1:08 PM

## 2021-06-28 NOTE — Progress Notes (Signed)
Raymond Henderson is doing okay.  He is still quite emotional.  His brother and sister are coming in from [place].  They will be here this evening.  He is getting radiation treatments.  I know that they are trying to shorten his course of radiation.  This sounds like he may finish up tomorrow.  He wants to know he will be able to walk again.  I told him that I would think that he would be able to walk again.  He will certainly take a lot of physical therapy.  He has had the kyphoplasty.  He is getting his radiation therapy.  He has had Zometa.  All these will be able to strengthen his spine.  He has had no bleeding.  He has had no fever.  He has had no cough or shortness of breath.  Our goal clearly is his quality of life.  Again he would like to be able to have some more mobility.  I think this would certainly be our priority.  Hopefully, he will be able to do his physical therapy.  I know this is emotionally difficult for him.  He has had no nausea or vomiting.  He has had no problems going to the bathroom.  His vital signs show temperature of 98.5.  Pulse 109.  Blood pressure 119/65.  His lungs sound clear bilaterally.  Cardiac exam is tachycardic but regular.  He has no murmurs.  Abdomen is soft.  He might be a little bit distended.  He has decreased bowel sounds.  There is no fluid wave.  He does have his liver edge just below the right costal margin.  Extremities shows decent strength in his legs.  He has decent range of motion.  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  He came in because of the back pain.  He has had the kyphoplasty.  He is getting radiation therapy.  He does not wish to have any systemic therapy.  I totally understand this.  For right now, we will continue to focus on his quality of life.  A lot of this will come down to physical therapy and him trying to be able to improve his ambulation.  I know this is a  "working progress."  I do appreciate everybody's help with him on 6 E.  Everybody is doing a fantastic job in trying to help his quality of life.  Lattie Haw, MD  Hebrews 12:12

## 2021-06-28 NOTE — Progress Notes (Signed)
Daily Progress Note   Patient Name: Raymond Henderson       Date: 06/28/2021 DOB: 11/15/1950  Age: 71 y.o. MRN#: [id number] Attending Physician: Raymond Pert, MD Primary Care Physician: Raymond Frames, MD Admit Date: 06/17/2021 Length of Stay: 10 days  Reason for Consultation/Follow-up: Establishing goals of care and Hospice Evaluation  HPI/Patient Profile:  71 y.o. male  with no significant past medical history admitted on 06/17/2021 with severe midthoracic back pain for several weeks.    Patient reports that he last had a colonoscopy approximately 15 years ago in [place] and has not seen a PCP in approximately that many years. Upon arrival to ED, initial work-up identified widespread osseous metastatic disease seen in the lumbar spine sacrum and pelvis, an associated pathologic L1 fracture, liver metastases, and a mass in the ascending colon, likely the primary malignancy.  He was given options for chemo and associated prognosis: If no chemo estimated life expectancy of 2 months; with chemo (and IF it worked, 25-30% chance) then life expectancy of 1 year. He decided to not undergo chemo and opted for palliative radiation for symptom management, which is started today.   PMT has been consulted to assist with goals of care conversation.  Subjective:   Subjective: Chart Reviewed. Updates received. Patient Assessed. Created space and opportunity for patient  and family to explore thoughts and feelings regarding current medical situation.  Today's Discussion: Today I met at the patient's bedside with the patient.  He was sleeping but awakened easily to voice.  He denies any pain currently.  He states last night after radiation he had very severe pain but medications help.  I explained that radiation typically helps after treatment sessions rather than during.  He states that he has been told this by radiation as well.  We discussed his happiness that his family is expected to arrive around 5:00  today.  They are going to help him in decision-making as far as which SNF/rehab to go to.  He denies any ongoing needs from palliative at this point.  I discussed that a coworker would continue to follow along his progress and can be involved further if any new needs arise.  He thanked [place] for our care and expressed his sincere appreciation for all the employees at the hospital.  I provided emotional and general support therapeutic listening, empathy, sharing of stories, and other techniques.  Answered all questions addressed all concerns to the best my ability.  Review of Systems  Constitutional:  Positive for fatigue.  Respiratory:  Negative for shortness of breath.   Cardiovascular:  Negative for chest pain.  Gastrointestinal:  Negative for abdominal pain, nausea and vomiting.    Objective:   Vital Signs:  BP (!) 142/80 (BP Location: Left Arm)   Pulse 88   Temp 98.2 F (36.8 C)   Resp 20   Ht '5\' 9"'  (1.753 m)   Wt 83.5 kg   SpO2 96%   BMI 27.17 kg/m   Physical Exam: Physical Exam Vitals and nursing note reviewed.  Constitutional:      General: He is not in acute distress.    Appearance: He is ill-appearing.  HENT:     Head: Normocephalic and atraumatic.  Pulmonary:     Effort: Pulmonary effort is normal. No respiratory distress.  Abdominal:     General: Abdomen is flat.  Skin:    General: Skin is warm and dry.  Neurological:     General: No focal deficit present.  Mental Status: He is alert.  Psychiatric:        Mood and Affect: Mood normal.        Behavior: Behavior normal.     Palliative Assessment/Data: 50-60%   Assessment & Plan:   Impression: Present on Admission:  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  Pathologic T11 and L1 fracture  Pathologic fracture of thoracic vertebrae, initial encounter  Hypercalcemia  Normocytic anemia  71 year old male with multiple health concerns as described above.  At this point significant cancer illness  where and he has declined chemotherapy and prognosis per oncology is around 2 months.  They have transferred him to [facility] hospital for palliative radiation to help with bony mets pain.  They have elected hospice care as well.  Initially considering assisted living versus long-term care, however the patient does not have resources for private pay for this level of care.  At this point, pending disposition is SNF/rehab.  Family expected to arrive this afternoon to assist in decision-making as far as which we have to go to.  I continue to express that he is hospice appropriate given his prognosis of approximately 2 months.  Depending on home care availability through family and support people at time of discharge from rehab, home hospice may be an option.  Eventually he would likely become eligible for residential hospice as he continues to decline.  Overall prognosis poor, previously expressed prognosis for about 2 months.  SUMMARY OF RECOMMENDATIONS   Continue DNR Continue palliative radiation Continue to work towards disposition: anticipate SNF/Rehab PMT will continue to follow peripherally Please call us for any significant changes in the meantime  Symptom Management:  Per primary team PMT is available to assist as needed  Code Status: DNR  Prognosis: < 3 months  Discharge Planning: To Be Determined  Discussed with: Medical team, nursing team, patient, patient's significant other, TOC team  Thank you for allowing [place] to participate in the care of Raymond Henderson PMT will continue to support holistically.  Billing based on MDM: High  Problems Addressed: One acute or chronic illness or injury that poses a threat to life or bodily function  Amount and/or Complexity of Data: Category 3:Discussion of management or test interpretation with external physician/other qualified health care professional/appropriate source (not separately reported)  Risks: Parenteral controlled substances and  Decision not to resuscitate or to de-escalate care because of poor prognosis   Raymond Field, NP Palliative Medicine Team  Team Phone # [phone] (Nights/Weekends)  09/12/2020, 8:17 AM

## 2021-06-28 NOTE — Care Management Important Message (Signed)
Important Message  Patient Details IM Letter given to the Patient. Name: Raymond Henderson MRN: [id number] Date of Birth: January 02, 1951   Medicare Important Message Given:  Yes     Kerin Salen 06/28/2021, 9:56 AM

## 2021-06-28 NOTE — TOC Progression Note (Signed)
Transition of Care [facility]) - Progression Note    Patient Details  Name: Raymond Henderson MRN: [id number] Date of Birth: [birth year]  Transition of Care [facility]) CM/SW Contact  Mae Denunzio, Marjie Skiff, RN Phone Number: 06/28/2021, 2:16 PM  Clinical Narrative:    Pt FL2 was faxed back out to area SNFs with the note that pt would complete radiation tomorrow 6/16. Pt received additional SNF bed offers. Additional offers provided to pt. He states he will look them over with is family tonight and have an answer for me in the morning.   Expected Discharge Plan: [place] Barriers to Discharge: Continued Medical Work up, SNF Pending bed offer  Expected Discharge Plan and Services Expected Discharge Plan: [place] In-house Referral: Clinical Social Work   Post Acute Care Choice: [place] arrangements for the past 2 months: Apartment                  Readmission Risk Interventions     No data to display

## 2021-06-28 NOTE — Progress Notes (Signed)
PROGRESS NOTE Raymond Henderson  FHQ:[id number] DOB: [birth year] DOA: 06/17/2021 PCP: Kathalene Frames, MD   Brief Narrative/Hospital Course: 71 year old M with no known PMH presented to ED with back pain for about 5 weeks and abdominal pain and constipation for about 10 days and found to have ascending colon mass with liver, vertebral and pelvic lesions as well as T11 and L1 pathologic fracture without significant canal stenosis.  Underwent US-guided liver biopsy on 6/5.  Pathology with poorly differentiated adenocarcinoma.  He had T11 and L1 vertebral biopsy and kyphoplasty on 6/7. Path with metastatic high-grade neuroendocrine carcinoma, favor small cell  type.  Radiation oncology consulted and started palliative radiation on 6/8.  Oncology, radiation oncology and palliative medicine following.  Transferring to [facility] for radiation treatment.  Therapy recommended SNF.  Patient getting consolidative radiation therapy till Friday patient has a skilled nursing facility approved for 6/14, SNF will be able to transfer him daily for radiation therapy.  Palliative care ordered and over felt that he is very appropriate for hospice.overall is medically stable.  He is awaiting for placement,  Subjective: Seen and examined this morning no new complaints resting comfortably  Assessment and Plan: Principal Problem:   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 Active Problems:   Pathologic T11 and L1 fracture   Pathologic fracture of thoracic vertebrae, initial encounter   Hypercalcemia   Normocytic anemia   Goals of care, counseling/discussion   Colonic mass   Liver lesion   Hyponatremia   Elevated alkaline phosphatase level   Toxic encephalopathy   Physical deconditioning   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 Poorly differentiated colon adenocarcinoma Pathologic T11 and L1 fracture Pathologic fracture of thoracic vertebrae, initial encounter: Transaminitis Back and abdominal  pain: Appreciate palliative care oncology IR evaluation, underwent kyphoplasty and pain is better controlled.  Continue current fentanyl patch, off IV opiates, on  p.o. oxy 10 mg prn. On XRT until Friday.  He is not yet a candidate for hospice home as he is eating quite well.  Continue PT OT, looking for skilled nursing facility-patient also discussed with his brother and sister before he goes to skilled nursing facility  Hypotension-BP has stabilized.  He had drpped in the 80s due to Coreg and poor oral intake, Coreg on hold, BP improved with IV fluids. Hypercalcemia:calcium last at 10. Normocytic anemia: hb stable last at 11 gm. Leucocytosis-monitor. Goals of care, counseling/discussion: DNR. Palliative care following. Hyponatremia:resolved Acute toxic encephalopathy: Due to pain medication resolved, mentating well.Monitor Physical deconditioning-cont pt ot, needs SNF  DVT prophylaxis: heparin injection 5,000 Units Start: 06/20/21 2200 SCDs Start: 06/18/21 0749 Code Status:   Code Status: DNR Family Communication: plan of care discussed with patient/Bonnie at bedside. Patient status is: inpatient because of pending placement Level of care: Med-Surg   Dispo: The patient is from: home            Anticipated disposition: SNF-awaiting decision by patient his friend and siblings.  Patient reports they are waiting patient's brother and sister to arrive from [place] today and he will d/w with them.  He is medical history for discharge to SNF.  Mobility Assessment (last 72 hours)     Mobility Assessment     Row Name 06/27/21 1955 06/26/21 2324 06/26/21 1042 06/26/21 0830 06/25/21 1954   Does patient have an order for bedrest or is patient medically unstable No - Continue assessment No - Continue assessment -- No - Continue assessment Yes- Bedfast (Level 1) - Complete  What is the highest level of mobility based on the progressive mobility assessment? Level 5 (Walks with assist in room/hall)  - Balance while stepping forward/back and can walk in room with assist - Complete Level 5 (Walks with assist in room/hall) - Balance while stepping forward/back and can walk in room with assist - Complete Level 5 (Walks with assist in room/hall) - Balance while stepping forward/back and can walk in room with assist - Complete Level 5 (Walks with assist in room/hall) - Balance while stepping forward/back and can walk in room with assist - Complete Level 1 (Bedfast) - Unable to balance while sitting on edge of bed   Is the above level different from baseline mobility prior to current illness? Yes - Recommend PT order Yes - Recommend PT order -- -- Yes - Recommend PT order             Objective: Vitals last 24 hrs: Vitals:   06/27/21 0651 06/27/21 1415 06/27/21 2117 06/28/21 0643  BP: 116/72 121/77 123/80 119/65  Pulse: (!) 109 (!) 108 (!) 110 (!) 109  Resp:  '18 19 18  '$ Temp: 98.2 F (36.8 C) 98.2 F (36.8 C) 98.3 F (36.8 C) 98.5 F (36.9 C)  TempSrc: Oral Oral Oral Oral  SpO2: 95% 94% 97% 95%  Weight:      Height:       Weight change:   Physical Examination: General exam: AA0,older than stated age, weak appearing. HEENT:Oral mucosa moist, Ear/Nose WNL grossly, dentition normal. Respiratory system: bilaterally diminished, no use of accessory muscle Cardiovascular system: S1 & S2 +, No JVD,. Gastrointestinal system: Abdomen soft,NT,ND,BS+ Nervous System:Alert, awake, moving extremities and grossly nonfocal Extremities: LE ankle edema neg,distal peripheral pulses palpable.  Skin: No rashes,no icterus. MSK: Normal muscle bulk,tone, power   Medications reviewed:  Scheduled Meds:  feeding supplement  237 mL Oral TID BM   fentaNYL  1 patch Transdermal Q72H   gabapentin  100 mg Oral Q8H   heparin injection (subcutaneous)  5,000 Units Subcutaneous Q8H   senna-docusate  2 tablet Oral BID  Continuous Infusions:   Diet Order             Diet regular Room service appropriate? Yes;  Fluid consistency: Thin  Diet effective now                 Nutrition Problem: Increased nutrient needs Etiology: chronic illness (carcinoma) Signs/Symptoms: estimated needs Interventions: Ensure Enlive (each supplement provides 350kcal and 20 grams of protein)  Intake/Output Summary (Last 24 hours) at 06/28/2021 1105 Last data filed at 06/27/2021 2230 Gross per 24 hour  Intake 480 ml  Output 650 ml  Net -170 ml   Net IO Since Admission: -1,672.1 mL [06/28/21 1105]  Wt Readings from Last 3 Encounters:  06/18/21 83.5 kg     Unresulted Labs (From admission, onward)    None     Data Reviewed: I have personally reviewed following labs and imaging studies CBC: Recent Labs  Lab 06/24/21 0953  WBC 15.3*  HGB 11.1*  HCT 35.3*  MCV 84.4  PLT 546    Basic Metabolic Panel: Recent Labs  Lab 06/24/21 0953  NA 136  K 3.9  CL 98  CO2 28  GLUCOSE 156*  BUN 22  CREATININE 0.67  CALCIUM 8.7*    GFR: Estimated Creatinine Clearance: 83.5 mL/min (by C-G formula based on SCr of 0.67 mg/dL). Liver Function Tests: No results for input(s): "AST", "ALT", "ALKPHOS", "BILITOT", "PROT", "ALBUMIN" in the last  168 hours.  No results for input(s): "LIPASE", "AMYLASE" in the last 168 hours. No results for input(s): "AMMONIA" in the last 168 hours. Coagulation Profile: No results for input(s): "INR", "PROTIME" in the last 168 hours. BNP (last 3 results) No results for input(s): "PROBNP" in the last 8760 hours. HbA1C: No results for input(s): "HGBA1C" in the last 72 hours. CBG: No results for input(s): "GLUCAP" in the last 168 hours. Lipid Profile: No results for input(s): "CHOL", "HDL", "LDLCALC", "TRIG", "CHOLHDL", "LDLDIRECT" in the last 72 hours. Thyroid Function Tests: No results for input(s): "TSH", "T4TOTAL", "FREET4", "T3FREE", "THYROIDAB" in the last 72 hours. Sepsis Labs: No results for input(s): "PROCALCITON", "LATICACIDVEN" in the last 168 hours.  No results found  for this or any previous visit (from the past 240 hour(s)).   Antimicrobials: Anti-infectives (From admission, onward)    Start     Dose/Rate Route Frequency Ordered Stop   06/20/21 1045  vancomycin (VANCOCIN) IVPB 1000 mg/200 mL premix        1,000 mg 200 mL/hr over 60 Minutes Intravenous To Radiology 06/20/21 0950 06/20/21 1316      Culture/Microbiology    Component Value Date/Time   SDES BLOOD LEFT ANTECUBITAL 06/18/2021 0518   SPECREQUEST AEROBIC BOTTLE ONLY Blood Culture adequate volume 06/18/2021 0518   CULT  06/18/2021 0518    NO GROWTH 5 DAYS Performed at [facility], [place] [address] [place], [place] [address]    REPTSTATUS 06/23/2021 FINAL 06/18/2021 0518  Radiology Studies: No results found.   LOS: 10 days   Antonieta Pert, MD [facility]  06/28/2021, 11:05 AM

## 2021-06-28 NOTE — Progress Notes (Incomplete)
                                                                                                                                         Patient Name: Raymond Henderson MRN: [id number] DOB: Jul 10, 1950 Referring Physician: Burney Gauze (Profile Not Attached) Date of Service: 06/29/2021 [facility], [place]                                                        End Of Treatment Note  Diagnoses: C79.51-Secondary malignant neoplasm of bone  Cancer Staging: Metastatic High Grade Neuroendocrine Carcinoma  Intent: Palliative  Radiation Treatment Dates: 06/25/2021 through 06/29/2021 Site Technique Total Dose (Gy) Dose per Fx (Gy) Completed Fx Beam Energies  Thoracic Spine: Spine_T10 Complex 3/3 3 1/1 15X  Lumbar Spine: Spine_L5 Complex 3/3 3 1/1 15X  Thoracic Spine: Spine_T10 Chg Complex 16/16 4 4/4 15X  Lumbar Spine: Spine_L5 Chg Complex 16/16 4 4/4 15X   Narrative: The patient tolerated radiation therapy relatively well. His course for radiation changed to 5 fractions as he declined systemic therapy and it was recommended by medical oncology and palliative medicine to proceed with hospice based care.  Plan:  He will discharge with hospice care. We will be available as needed moving forward.  ________________________________________________    Carola Rhine, PAC

## 2021-06-29 ENCOUNTER — Ambulatory Visit
Admit: 2021-06-29 | Discharge: 2021-06-29 | Disposition: A | Discharge status: Home / Self Care | Payer: Medicare Other | Attending: Radiation Oncology | Admitting: Radiation Oncology

## 2021-06-29 ENCOUNTER — Other Ambulatory Visit: Payer: Self-pay

## 2021-06-29 ENCOUNTER — Encounter: Payer: Self-pay | Admitting: Radiation Oncology

## 2021-06-29 DIAGNOSIS — C189 Malignant neoplasm of colon, unspecified: Secondary | ICD-10-CM | POA: Diagnosis not present

## 2021-06-29 LAB — RAD ONC ARIA SESSION SUMMARY
Course Elapsed Days: 4
Plan Fractions Treated to Date: 4
Plan Fractions Treated to Date: 4
Plan Prescribed Dose Per Fraction: 4 Gy
Plan Prescribed Dose Per Fraction: 4 Gy
Plan Total Fractions Prescribed: 4
Plan Total Fractions Prescribed: 4
Plan Total Prescribed Dose: 16 Gy
Plan Total Prescribed Dose: 16 Gy
Reference Point Dosage Given to Date: 19 Gy
Reference Point Dosage Given to Date: 19 Gy
Reference Point Session Dosage Given: 4 Gy
Reference Point Session Dosage Given: 4 Gy
Session Number: 5

## 2021-06-29 MED ORDER — OXYCODONE HCL 10 MG PO TABS: 10.0000 mg | ORAL_TABLET | Freq: Four times a day (QID) | ORAL | 0 refills | Status: DC | PRN

## 2021-06-29 MED ORDER — POLYETHYLENE GLYCOL 3350 17 G PO PACK: 17.0000 g | PACK | Freq: Two times a day (BID) | ORAL | 0 refills | Status: DC | PRN

## 2021-06-29 MED ORDER — ONDANSETRON HCL 4 MG PO TABS: 4.0000 mg | ORAL_TABLET | Freq: Four times a day (QID) | ORAL | 0 refills | Status: DC | PRN

## 2021-06-29 MED ORDER — ACETAMINOPHEN 325 MG PO TABS: 650.0000 mg | ORAL_TABLET | Freq: Four times a day (QID) | ORAL | Status: DC | PRN

## 2021-06-29 MED ORDER — FENTANYL 25 MCG/HR TD PT72: 1.0000 | MEDICATED_PATCH | TRANSDERMAL | 0 refills | Status: DC

## 2021-06-29 MED ORDER — ENSURE ENLIVE PO LIQD: 237.0000 mL | Freq: Three times a day (TID) | ORAL | 12 refills | Status: DC

## 2021-06-29 MED ORDER — BISACODYL 10 MG RE SUPP: 10.0000 mg | Freq: Every day | RECTAL | 0 refills | Status: DC | PRN

## 2021-06-29 MED ORDER — SENNOSIDES-DOCUSATE SODIUM 8.6-50 MG PO TABS: 2.0000 | ORAL_TABLET | Freq: Two times a day (BID) | ORAL | Status: DC

## 2021-06-29 NOTE — Progress Notes (Signed)
Overall, there really has been no change in Raymond Henderson.  He will finish his radiation therapy today.  Is hard to say and when she really is eating.  His prealbumin is only 5.6.  This is incredibly low.  This is clearly an ominous prognostic factor.  He is still quite emotional.  He still would like to walk.  I really think he could walk at some point.  I know it would take quite a bit of physical therapy for this.  I am not sure what the discharge plans are for him as to where he is going to go.  It would be nice if he could go home.  I am not sure he is able to have an independent living situation.  His brother and sister came in from [place].  He was happy to see them.  He seems to hurt all over.  He said he just woke up hurting.  It is nice which is back.  There is been no fever.  He has had no bleeding.  He has had no nausea or vomiting.  His vital signs show temperature of 99.2.  Pulse 106.  Blood pressure 120/79.  Head and neck exam shows no ocular or oral lesions.  His lungs are clear bilaterally.  He has good air movement bilaterally.  Cardiac exam tachycardic but regular.  Abdomen is soft.  Bowel sounds are present.  He has no fluid wave.  There is no guarding or rebound tenderness.  Extremity shows no clubbing, cyanosis or edema.  Neurological exam is nonfocal.  Raymond Henderson has a metastatic colon cancer.  This is poorly differentiated.  To me, I think the prealbumin is a clear indicator as to the prognosis for him.  Again it would be nice to try to get him to have a little bit more activity.  Maybe, if he can actually sit up in a chair today, this would be a good start for him.  I know he has had incredible care from all the staff upon 6 E.  I appreciate how much they care for the patients and the compassion that they have.  Lattie Haw, MD  1 Timothy 6:6

## 2021-06-29 NOTE — Progress Notes (Signed)
Physical Therapy Treatment Patient Details Name: Raymond Henderson MRN: [id number] DOB: 1950-03-12 Today's Date: 06/29/2021   History of Present Illness Pt is a 71 y/o male admitted secondary to worsening back pain. 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 Found to have liver mets and suspected primary lesion in colon. Workup pending. No pertinent PMH.    PT Comments    Pt AxO x 3 pleasant.  Feeling "somewhat better".  Assisted OOB to [facility] then amb in hallway.  General Gait Details: pt with slow guarded gait, no overt LOB throughout. pt steps with reduced hip/knee flexion due to weakness. Increased c/o ABD pain R UQ with activity. Recliner following for safety.    Recommendations for follow up therapy are one component of a multi-disciplinary discharge planning process, led by the attending physician.  Recommendations may be updated based on patient status, additional functional criteria and insurance authorization.  Follow Up Recommendations  Skilled nursing-short term rehab (<3 hours/day)     Assistance Recommended at Discharge Frequent or constant Supervision/Assistance  Patient can return home with the following A little help with walking and/or transfers;A little help with bathing/dressing/bathroom;Assistance with cooking/housework;Help with stairs or ramp for entrance;Assist for transportation   Equipment Recommendations  Rolling walker (2 wheels)    Recommendations for Other Services       Precautions / Restrictions Precautions Precautions: Back;Fall Restrictions Weight Bearing Restrictions: No     Mobility  Bed Mobility Overal bed mobility: Needs Assistance Bed Mobility: Supine to Sit Rolling: Min assist       Sit to sidelying: Min assist General bed mobility comments: assist to powerup trunk plus increased time    Transfers Overall transfer level: Needs assistance Equipment used: Rolling walker (2 wheels) Transfers: Sit  to/from Stand Sit to Stand: Min guard           General transfer comment: to/from EOB and recliner. used rw. cues for technique with rw.    Ambulation/Gait Ambulation/Gait assistance: Min assist, Min guard Gait Distance (Feet): 55 Feet Assistive device: Rolling walker (2 wheels)   Gait velocity: decr     General Gait Details: pt with slow guarded gait, no overt LOB throughout. pt steps with reduced hip/knee flexion due to weakness.   Stairs             Wheelchair Mobility    Modified Rankin (Stroke Patients Only)       Balance                                            Cognition Arousal/Alertness: Awake/alert Behavior During Therapy: WFL for tasks assessed/performed Overall Cognitive Status: Within Functional Limits for tasks assessed                                 General Comments: AxO x 3 pleasant, following all directions and able to express need for BM and c/o ABD pain R UQ        Exercises      General Comments        Pertinent Vitals/Pain Pain Assessment Pain Assessment: Faces Faces Pain Scale: Hurts a little bit Pain Location: ABD RUQ Pain Descriptors / Indicators: Grimacing Pain Intervention(s): Monitored during session    Home Living  Prior Function            PT Goals (current goals can now be found in the care plan section) Progress towards PT goals: Progressing toward goals    Frequency    Min 3X/week      PT Plan Current plan remains appropriate    Co-evaluation              AM-PAC PT "6 Clicks" Mobility   Outcome Measure  Help needed turning from your back to your side while in a flat bed without using bedrails?: A Little Help needed moving from lying on your back to sitting on the side of a flat bed without using bedrails?: A Little Help needed moving to and from a bed to a chair (including a wheelchair)?: A Little Help needed standing up  from a chair using your arms (e.g., wheelchair or bedside chair)?: A Little Help needed to walk in hospital room?: A Little Help needed climbing 3-5 steps with a railing? : A Lot 6 Click Score: 17    End of Session Equipment Utilized During Treatment: Gait belt Activity Tolerance: Patient tolerated treatment well Patient left: in chair;with call bell/phone within reach;with chair alarm set Nurse Communication: Mobility status PT Visit Diagnosis: Unsteadiness on feet (R26.81);Muscle weakness (generalized) (M62.81);Difficulty in walking, not elsewhere classified (R26.2);Pain     Time: 1000-1020 PT Time Calculation (min) (ACUTE ONLY): 20 min  Charges:  $Gait Training: 8-22 mins                     {Adasia Hoar  PTA Acute  Rehabilitation Services Pager      [phone] Office      [phone]

## 2021-06-29 NOTE — Progress Notes (Signed)
Report given to [facility] RN of [place].

## 2021-06-29 NOTE — TOC Transition Note (Signed)
Transition of Care [facility]) - CM/SW Discharge Note   Patient Details  Name: Raymond Henderson MRN: [id number] Date of Birth: 1950/04/12  Transition of Care [facility]) CM/SW Contact:  Lynnell Catalan, RN Phone Number: 06/29/2021, 12:44 PM   Clinical Narrative:    Pt has chosen [facility]. He will dc there today via [facility]. Yellow DNR on the chart for transport. RN to call report to [phone].   Patient Goals and CMS Choice Patient states their goals for this hospitalization and ongoing recovery are:: pt does want to pursue radiation treatment CMS Medicare.gov Compare Post Acute Care list provided to:: Patient Represenative (must comment) Choice offered to / list presented to : Spouse Horris Latino)   Discharge Plan and Services In-house Referral: Clinical Social Work   Post Acute Care Choice: [place]             Social Determinants of Health (SDOH) Interventions     Readmission Risk Interventions     No data to display

## 2021-07-02 ENCOUNTER — Ambulatory Visit: Payer: Medicare Other

## 2021-07-03 ENCOUNTER — Ambulatory Visit: Payer: Medicare Other

## 2021-07-04 ENCOUNTER — Ambulatory Visit: Payer: Medicare Other

## 2021-07-05 ENCOUNTER — Ambulatory Visit: Payer: Medicare Other

## 2021-07-05 ENCOUNTER — Encounter: Payer: Self-pay | Admitting: Hematology & Oncology

## 2021-07-05 ENCOUNTER — Telehealth: Payer: Self-pay | Admitting: *Deleted

## 2021-07-05 DIAGNOSIS — C189 Malignant neoplasm of colon, unspecified: Secondary | ICD-10-CM | POA: Insufficient documentation

## 2021-07-05 DIAGNOSIS — C787 Secondary malignant neoplasm of liver and intrahepatic bile duct: Secondary | ICD-10-CM

## 2021-07-05 DIAGNOSIS — C7951 Secondary malignant neoplasm of bone: Secondary | ICD-10-CM | POA: Insufficient documentation

## 2021-07-05 HISTORY — DX: Malignant neoplasm of colon, unspecified: C18.9

## 2021-07-05 HISTORY — DX: Malignant neoplasm of colon, unspecified: C79.51

## 2021-07-05 HISTORY — DX: Malignant neoplasm of colon, unspecified: C78.7

## 2021-07-05 NOTE — Telephone Encounter (Signed)
Call received from patient's daughter Raymond Henderson wanting to know if she can get a phone call from Dr. Marin Olp regarding her father's condition and would also like to know if pt can get in to see Dr. Marin Olp soon.  Dr. Marin Olp notified and states that he will call pt.'s daughter and would like to see pt on Monday, 07/09/21.  Message sent to scheduling.

## 2021-07-06 ENCOUNTER — Ambulatory Visit: Payer: Medicare Other

## 2021-07-09 ENCOUNTER — Telehealth: Payer: Self-pay | Admitting: *Deleted

## 2021-07-09 ENCOUNTER — Inpatient Hospital Stay: Payer: Medicare Other | Attending: Hematology & Oncology

## 2021-07-09 ENCOUNTER — Encounter: Payer: Self-pay | Admitting: Hematology & Oncology

## 2021-07-09 ENCOUNTER — Other Ambulatory Visit: Payer: Self-pay

## 2021-07-09 ENCOUNTER — Inpatient Hospital Stay (HOSPITAL_BASED_OUTPATIENT_CLINIC_OR_DEPARTMENT_OTHER): Payer: Medicare Other | Admitting: Hematology & Oncology

## 2021-07-09 ENCOUNTER — Inpatient Hospital Stay: Payer: Medicare Other

## 2021-07-09 VITALS — BP 107/73 | HR 61 | Temp 97.6°F | Resp 20 | Wt 171.0 lb

## 2021-07-09 DIAGNOSIS — C772 Secondary and unspecified malignant neoplasm of intra-abdominal lymph nodes: Secondary | ICD-10-CM

## 2021-07-09 DIAGNOSIS — C787 Secondary malignant neoplasm of liver and intrahepatic bile duct: Secondary | ICD-10-CM | POA: Diagnosis not present

## 2021-07-09 DIAGNOSIS — C189 Malignant neoplasm of colon, unspecified: Secondary | ICD-10-CM | POA: Diagnosis present

## 2021-07-09 DIAGNOSIS — C7951 Secondary malignant neoplasm of bone: Secondary | ICD-10-CM | POA: Insufficient documentation

## 2021-07-09 LAB — CEA (IN HOUSE-CHCC): CEA (CHCC-In House): 15.71 ng/mL — ABNORMAL HIGH (ref 0.00–5.00)

## 2021-07-09 LAB — CBC WITH DIFFERENTIAL (CANCER CENTER ONLY)
Abs Immature Granulocytes: 0.56 10*3/uL — ABNORMAL HIGH (ref 0.00–0.07)
Basophils Absolute: 0.1 10*3/uL (ref 0.0–0.1)
Basophils Relative: 1 %
Eosinophils Absolute: 0.1 10*3/uL (ref 0.0–0.5)
Eosinophils Relative: 1 %
HCT: 32.6 % — ABNORMAL LOW (ref 39.0–52.0)
Hemoglobin: 10.3 g/dL — ABNORMAL LOW (ref 13.0–17.0)
Immature Granulocytes: 5 %
Lymphocytes Relative: 4 %
Lymphs Abs: 0.5 10*3/uL — ABNORMAL LOW (ref 0.7–4.0)
MCH: 28.1 pg (ref 26.0–34.0)
MCHC: 31.6 g/dL (ref 30.0–36.0)
MCV: 88.8 fL (ref 80.0–100.0)
Monocytes Absolute: 1.2 10*3/uL — ABNORMAL HIGH (ref 0.1–1.0)
Monocytes Relative: 10 %
Neutro Abs: 10.1 10*3/uL — ABNORMAL HIGH (ref 1.7–7.7)
Neutrophils Relative %: 79 %
Platelet Count: 264 10*3/uL (ref 150–400)
RBC: 3.67 MIL/uL — ABNORMAL LOW (ref 4.22–5.81)
RDW: 20.7 % — ABNORMAL HIGH (ref 11.5–15.5)
WBC Count: 12.5 10*3/uL — ABNORMAL HIGH (ref 4.0–10.5)
nRBC: 0 % (ref 0.0–0.2)

## 2021-07-09 LAB — CMP (CANCER CENTER ONLY)
ALT: 41 U/L (ref 0–44)
AST: 65 U/L — ABNORMAL HIGH (ref 15–41)
Albumin: 3.2 g/dL — ABNORMAL LOW (ref 3.5–5.0)
Alkaline Phosphatase: 749 U/L — ABNORMAL HIGH (ref 38–126)
Anion gap: 9 (ref 5–15)
BUN: 23 mg/dL (ref 8–23)
CO2: 25 mmol/L (ref 22–32)
Calcium: 9.5 mg/dL (ref 8.9–10.3)
Chloride: 94 mmol/L — ABNORMAL LOW (ref 98–111)
Creatinine: 0.49 mg/dL — ABNORMAL LOW (ref 0.61–1.24)
GFR, Estimated: >60 mL/min (ref >60)
Glucose, Bld: 228 mg/dL — ABNORMAL HIGH (ref 70–99)
Potassium: 4.4 mmol/L (ref 3.5–5.1)
Sodium: 128 mmol/L — ABNORMAL LOW (ref 135–145)
Total Bilirubin: 3.7 mg/dL (ref 0.3–1.2)
Total Protein: 7 g/dL (ref 6.5–8.1)

## 2021-07-09 LAB — PREALBUMIN: Prealbumin: <5 mg/dL — ABNORMAL LOW (ref 18–38)

## 2021-07-09 MED ORDER — DENOSUMAB 120 MG/1.7ML ~~LOC~~ SOLN
120.0000 mg | Freq: Once | SUBCUTANEOUS | Status: AC
  Administered 2021-07-09: 120 mg via SUBCUTANEOUS
  Filled 2021-07-09: qty 1.7

## 2021-07-09 MED ORDER — SODIUM CHLORIDE 0.9 % IV SOLN: Freq: Once | INTRAVENOUS | Status: AC | PRN

## 2021-07-09 NOTE — Telephone Encounter (Signed)
[facility] and [facility] notified per order of Dr. Myna Hidalgo that pt will need transfer to [facility].

## 2021-07-10 ENCOUNTER — Telehealth: Payer: Self-pay | Admitting: *Deleted

## 2021-07-10 NOTE — Telephone Encounter (Signed)
Rosey Bath from [facility] called to let us know that patient is not going to be at [facility] at this time.  The admission nurse determined he is not quite ready for [facility] at this time so will stat at [facility] and will have palliative care at this time.  They cant do Hospice at this time because he would not be able to get rehab.  Dr Myna Hidalgo will be notified.

## 2021-07-13 ENCOUNTER — Inpatient Hospital Stay: Payer: Medicare Other

## 2021-07-18 ENCOUNTER — Encounter: Payer: Self-pay | Admitting: Internal Medicine

## 2021-07-18 ENCOUNTER — Encounter: Payer: Self-pay | Admitting: Hematology & Oncology

## 2021-07-18 ENCOUNTER — Non-Acute Institutional Stay: Payer: Medicare Other | Admitting: Internal Medicine

## 2021-07-18 VITALS — BP 97/65 | HR 97 | Temp 97.6°F | Resp 16 | Ht 69.0 in | Wt 169.6 lb

## 2021-07-18 DIAGNOSIS — C189 Malignant neoplasm of colon, unspecified: Secondary | ICD-10-CM

## 2021-07-18 DIAGNOSIS — C772 Secondary and unspecified malignant neoplasm of intra-abdominal lymph nodes: Secondary | ICD-10-CM

## 2021-07-18 DIAGNOSIS — R5381 Other malaise: Secondary | ICD-10-CM

## 2021-07-18 DIAGNOSIS — M8448XA Pathological fracture, other site, initial encounter for fracture: Secondary | ICD-10-CM

## 2021-07-18 NOTE — Progress Notes (Signed)
[place] Consult Note Telephone: [phone]  Fax: [phone]   Date of encounter: 07/18/21 11:11 AM PATIENT NAME: Raymond Henderson [address] [place]  [address]   [phone] (home)  DOB: 18-Nov-1950 MRN: [id number] PRIMARY CARE PROVIDER:    Kathalene Frames, MD,  [address], [address] [place] [address] [phone]  REFERRING PROVIDER:   Dr. Sherril Croon at [place]:    Contact Information     Name Relation Home Work Mobile   [place] Significant other [phone]  [phone]   Spude,Kathy Sister   [phone]   Denzel, Etienne Daughter   [phone]        I met face to face with patient and family in [facility] facility. Palliative Care was asked to follow this patient by consultation request of  Dr. Sherril Croon to address advance care planning and complex medical decision making. This is the initial visit.                                     ASSESSMENT AND PLAN / RECOMMENDATIONS:   Advance Care Planning/Goals of Care: Goals include to maximize quality of life and symptom management. Patient/health care surrogate gave his/her permission to discuss.Our advance care planning conversation included a discussion about:    The value and importance of advance care planning  Experiences with loved ones who have been seriously ill or have died  Exploration of personal, cultural or spiritual beliefs that might influence medical decisions  Exploration of goals of care in the event of a sudden injury or illness  Identification  of a healthcare agent  Review and updating or creation of an  advance directive document . Decision not to resuscitate or to de-escalate disease focused treatments due to poor prognosis CODE STATUS:  DNR Pt was drowsy and waiting for his pain medication to kick in so he could rest.  He was unable to participate in the discussion.  Initially, I  spoke with his friend who was visiting.  She indicated that he has been getting progressively worse and they are all aware.  Then spoke with his sister by phone for 16 minutes about goals and the plans for him which was consistent with Andree [place]'s recommendation to complete therapy and then move to hospice.  I spoke with PTA about his progress and he noted that Earnie was showing progressive decline in his ability to participate in therapy due to fatigue and weakness.  He is unable to return home.  He had been evaluated a couple of weeks ago by the [place] for [facility] at Dr. Antonieta Pert recommendation, but was not yet eligible at that time.   At this point, he is not eating except bites of soft foods (teeth are bad per his sister) for 2 weeks now, he is drinking sprite.  PPS 20% with grimacing with movement.  He c/o lower back pain and requested to be repositioned.  He was having hallucinations and rambling speech.  Since having fentanyl patches and scheduled oxycodone, he has been less agitated.  Juliann Pulse understands he is not going to get better.  She is on board with whatever is recommended for him to be more comfortable for the duration of his life. I called and left a message for Alyssa with my recommendation that hospice care be considered at this time.  Likely short-stay  at this point.    Symptom Management/Plan: 1. 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 Colon cancer metastasized to liver [facility]) -continue comfort care -no longer benefiting from therapy and would benefit more from hospice care if transition can be made financially in the facility  3. 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 Physical deconditioning -not making progress with therapy at this point per  PT, currently being used primarily in a restorative fashion and for repositioning for comfort  5. Pathologic T11 and L1 fracture -cont fentanyl patches with scheduled oxycodone plus prn for breakthrough -prns should be used if patient grimacing or groaning suggesting pain    Follow up Palliative Care Visit: Palliative care will continue to follow for complex medical decision making, advance care planning, and clarification of goals.   Hospice pending.  This visit was coded based on medical decision making (MDM). 36 minutes spent on ACP between conversations with his sister and his long-term friend.  PPS: 20%  HOSPICE ELIGIBILITY/DIAGNOSIS: TBD  Chief Complaint: initial palliative care consult  HISTORY OF PRESENT ILLNESS:  Raymond Henderson is a 71 y.o. year old male  with colon cancer initially diagnosed when he presented to the ED 06/17/21 with back pain x 5 wks in the mid thoracic area.  It had progressed to where he could not get out of bed independently.  He also had been having challenges with constipation having bms only every 10 days, had unintentional weight loss of 15 lbs and intermittent night sweats for 5 wks.  He'd been healthy prior to this.  In the ED, his initial MRI of the thoracolumbar spine revealed widespread osseous mets in the lumbar spine, sacrum, pelvic plus T11 and L1 pathologic compression fxs.  He was admitted for further evaluation.  He was also noted to have WBC 17, mild anemia to 11.4, and hyponatremia to 132 with calcium 10.5.  His CT abdomen revealed a sizable mass of the ascending colon.  He requested DNR and palliative consult during his hospitalization.  He underwent palliative oncology IR evaluation and kyphoplasty which helped his pain along with fentanyl patch, oral opiates.  He had challenges with hypotension so coreg was held and he was hydrated with IVFs. He completed radiation therapy 6/16 and was then discharged to [facility] for rehab.  He also was given xgeva x  1.  He did not want to pursue systemic chemotherapy.  He had a f/u with Dr. Marin Olp 6/26 who noted that pt has not been eating, was having periods of confusion, his bilirubin was trending up, his prealbumin was 5.6, and Dr. Marin Olp did not expect him to show improvement during his rehab stay and feared he may not survive the month of July.  He initially made a recommendation for hospice at [facility]; however, he was determined not to yet be appropriate for there and he was receiving PT at [facility].  DON notified me that patient was continuing to decline and that pt and family were open to the idea of hospice at home.      When seen today, he was too drowsy to participate in decisions or discussions and wanted to rest.  I spoke with his friend in person and then his sister by phone.  History obtained from review of EMR,  discussion with primary team, and interview with family, facility staff/caregiver and/or Mr. Hochstetler.   I reviewed available labs, medications, imaging, studies and related documents from the EMR.  Records reviewed and summarized above.   ROS  see hpi and a/p. Review of Systems   Physical Exam: Current and past weights:  169.6 lb on 07/11/21 69 in tall; 184 lbs on 06/18/21 Vitals:   07/18/21 1110  BP: 97/65  Pulse: 97  Resp: 16  Temp: 97.6 F (36.4 C)  SpO2: 96%   Physical Exam Constitutional:      Comments: Drowsy, grimacing with adjusting positions, slightly jaundice, pale  HENT:     Head: Normocephalic and atraumatic.  Cardiovascular:     Rate and Rhythm: Regular rhythm. Tachycardia present.  Pulmonary:     Effort: Pulmonary effort is normal. No respiratory distress.     Breath sounds: No wheezing.  Abdominal:     General: Bowel sounds are normal.     Comments: Had BM during time I was speaking with his sister  Musculoskeletal:     Right lower leg: No edema.     Left lower leg: No edema.  Neurological:     General: No focal deficit present.  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 ([place]) 07/05/2021   Colon cancer metastasized to liver ([place]) 07/05/2021   Physical deconditioning 06/22/2021   Colonic mass 06/20/2021   Liver lesion 06/20/2021   Hyponatremia 06/20/2021   Elevated alkaline phosphatase level 06/20/2021   Toxic encephalopathy 06/20/2021   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 06/18/2021   Pathologic T11 and L1 fracture 06/18/2021   Pathologic fracture of thoracic vertebrae, initial encounter 06/18/2021   Hypercalcemia 06/18/2021   Normocytic anemia 06/18/2021   Goals of care, counseling/discussion 06/18/2021   PAST MEDICAL HISTORY:  Active Ambulatory Problems    Diagnosis Date Noted   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 06/18/2021   Pathologic T11 and L1 fracture 06/18/2021   Pathologic fracture of thoracic vertebrae, initial encounter 06/18/2021   Hypercalcemia 06/18/2021   Normocytic anemia 06/18/2021   Goals of care, counseling/discussion 06/18/2021   Colonic mass 06/20/2021   Liver lesion 06/20/2021   Hyponatremia 06/20/2021   Elevated alkaline phosphatase level 06/20/2021   Toxic encephalopathy 06/20/2021   Physical deconditioning 06/22/2021   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 ([place]) 07/05/2021   Colon cancer metastasized to liver ([place]) 07/05/2021   Resolved Ambulatory Problems    Diagnosis Date Noted   No Resolved Ambulatory Problems   Past Medical History:  Diagnosis Date   No pertinent past medical history    SOCIAL HX:  Social History   Tobacco Use   Smoking status: Never   Smokeless tobacco: Never  Substance Use Topics   Alcohol use: Never   FAMILY HX:  Family History  Problem Relation Age of Onset   Heart disease Neg Hx       ALLERGIES:  Allergies  Allergen Reactions   Penicillins Other (See Comments)    Unknown reaction as a teenager      PERTINENT MEDICATIONS:   MED OXYCODONE HCL (IR) 10 MG TAB- take 1 tab po every 8 hours on schedule DeMarchi, William 07/09/2021 1:59 PM MED HYDROCORTISONE 1% OINTMENT APPLY TO RASHES ON BACK TWICE DAILY AS NEEDED FOR RASH/ITCHINESS Madison Hickman 07/09/2021 12:30 PM MED OXYCODONE HCL (IR)  5 MG TABLET- take 1 tab po every 6 hours as needed for moderate to severe pain x 14 days DeMarchi, Gwyndolyn Saxon 07/04/2021 11:22 AM MED FENTANYL 25 MCG/HR PATCH; PLACE 1 PATCH ONTO THE SKIN EVERY 3 DAYS. DeMarchi, Gwyndolyn Saxon 07/03/2021 8:29 AM MED SENEXON-S 50-8.6 MG TABLET TAKE 2 TABLETS BY MOUTH TWICE A DAY FOR CONSTIPATION (DO NOT CRUSH) DeMarchi, William 07/02/2021 10:00 PM MED GABAPENTIN 300 MG CAPSULE TAKE 1 CAPSULE BY MOUTH EVERY 8 HOURS (DO NOT CRUSH) DeMarchi, William 07/02/2021 10:00 PM MED BISACODYL 10 MG SUPPOSITORY PLACE 1 SUPPOSITORY EVERY DAY AS NEEDED FOR CONSTIPATION. DeMarchi, Gwyndolyn Saxon 07/02/2021 2:30 PM MED ONDANSETRON HCL 4 MG TABLET TAKE 1 TABLET BY MOUTH EVERY 6 HOURS AS NEEDED FOR NAUSEA DeMarchi, William 07/02/2021 2:30 PM MED POLYETHYLENE GLYCOL 3350 POWD MIX 17G WITH 4 OUNCE OF WATER AND TAKE BY MOUTH TWICE DAILY AS NEEDED FOR CONSTIPATION DeMarchi, William 06/29/2021 2:30 PM MED ACETAMINOPHEN 325 MG TABLET; TAKE 2 TABLETS (650MG) BY MOUTH EVERY 6 HOURS AS NEEDED FOR FEVER GREATER THAN OR EQUAL TO 101 DeMarchi, William 06/29/2021 1:07 PM MED ACETAMINOPHEN 325 MG TABLET; TAKE 2 TABLETS (650MG) EVERY 6 HOURS AS NEEDED FOR MILD PAIN Madison Hickman 06/29/2021 12:55 PM MED Constipation (2 of 4): If not relieved by MOM, give 10 mg Bisacodyl suppositiory rectally X 1 dose in 24 hours as needed (Do not use constipation standing orders for residents with renal failure/CFR less than 30. Contact MD for orders) (Physician Order) Madison Hickman 06/29/2021 12:54 PM MED Constipation (3 of 4): If not relieved by Biscodyl suppository, give disposable Saline Enema  rectally X 1 dose/24 hrs as needed (Do not use constipation standing orders for residents with renal failure/CFR less than 30. Contact MD for orders)(Physician Or Madison Hickman 06/29/2021 12:54 PM MED Constipation (1 of 4): If no BM in 3 days, give 30 cc Milk of Magnesium p.o. x 1 dose in 24 hours as needed (Do not use standing constipation orders for residents with renal failure CFR less than 30. Contact MD for orders) (Physician Order) Madison Hickman 06/29/2021 12:53 PM  Thank you for the opportunity to participate in the care of Mr. Pullara.  The palliative care team will continue to follow. Please call our office at [phone] if we can be of additional assistance.   Renad Jenniges, DO ,   COVID-19 PATIENT SCREENING TOOL Asked and negative response unless otherwise noted:  Have you had symptoms of covid, tested positive or been in contact with someone with symptoms/positive test in the past 5-10 days? no

## 2021-08-14 DEATH — deceased
